# Patient Record
Sex: Female | Born: 1980 | Race: White | Hispanic: No | Smoking: Former smoker
Health system: Southern US, Community
[De-identification: ages and names within clinical notes are randomized; demographics above are authoritative.]

## PROBLEM LIST (undated history)

## (undated) DIAGNOSIS — F419 Anxiety disorder, unspecified: Secondary | ICD-10-CM

## (undated) DIAGNOSIS — R7303 Prediabetes: Secondary | ICD-10-CM

## (undated) DIAGNOSIS — N200 Calculus of kidney: Secondary | ICD-10-CM

## (undated) DIAGNOSIS — F329 Major depressive disorder, single episode, unspecified: Secondary | ICD-10-CM

## (undated) DIAGNOSIS — F32A Depression, unspecified: Secondary | ICD-10-CM

## (undated) DIAGNOSIS — L709 Acne, unspecified: Secondary | ICD-10-CM

## (undated) HISTORY — DX: Calculus of kidney: N20.0

## (undated) HISTORY — DX: Acne, unspecified: L70.9

---

## 2005-04-29 ENCOUNTER — Ambulatory Visit: Payer: Self-pay | Admitting: Family Medicine

## 2005-05-02 ENCOUNTER — Ambulatory Visit (HOSPITAL_COMMUNITY): Admission: RE | Admit: 2005-05-02 | Discharge: 2005-05-02 | Payer: Self-pay | Admitting: Family Medicine

## 2005-05-10 ENCOUNTER — Ambulatory Visit: Payer: Self-pay | Admitting: Family Medicine

## 2006-09-02 ENCOUNTER — Encounter (INDEPENDENT_AMBULATORY_CARE_PROVIDER_SITE_OTHER): Payer: Self-pay | Admitting: Specialist

## 2006-09-02 ENCOUNTER — Ambulatory Visit: Payer: Self-pay | Admitting: Family Medicine

## 2007-11-05 ENCOUNTER — Encounter: Payer: Self-pay | Admitting: Family Medicine

## 2007-11-26 ENCOUNTER — Emergency Department: Payer: Self-pay | Admitting: Emergency Medicine

## 2010-12-04 NOTE — Letter (Signed)
Summary: rpc chart  rpc chart   Imported By: Curtis Sites 08/13/2010 11:36:17  _____________________________________________________________________  External Attachment:    Type:   Image     Comment:   External Document

## 2011-03-19 NOTE — Assessment & Plan Note (Signed)
Molly Miller NO.:  [id number]   MEDICAL RECORD NO.:  [id number]          PATIENT TYPE:  POB   LOCATION:  [facility] at [facility]         FACILITY:  [facility]   PHYSICIAN:  Tinnie Gens, MD        DATE OF BIRTH:  April 05, 1981   DATE OF SERVICE:                                    CLINIC NOTE   CHIEF COMPLAINT:  Yearly exam.   HISTORY OF PRESENT ILLNESS:  The patient is a 31 year old nulligravida who  is currently a smoker.  She is on birth control pills and has been for the  past 2 years.  The patient has normal cycles and is happy with her choice of  birth control currently.   PAST MEDICAL HISTORY:  Negative.   PAST SURGICAL HISTORY:  Negative.   MEDICATIONS:  OrthoEver patch.   ALLERGIES:  None known.   FAMILY HISTORY:  She has a strong family history of ovarian and breast  cancer as well as diabetes and coronary artery disease.  She has a mother  who has had a stroke.   SOCIAL HISTORY:  She works as a Facilities manager.  She is a social alcohol user  and she smokes 1.5 packs a day.   REVIEW OF SYMPTOMS:  Review of systems, 14 point review is negative for  headache, vision changes, hearing loss, difficulty swallowing, shortness of  breath, chest pain, nausea, vomiting, diarrhea, constipation, abdominal  pain, back pain, vaginal discharge, vaginal odor, painful intercourse,  pelvic pain, lower extremity swelling or skin problems.   PHYSICAL EXAMINATION:  Vitals are in the chart.  She is well developed, well  nourished, in no acute distress.  HEENT:  Normocephalic, atraumatic, sclera anicteric.  NECK:  Neck supple, normal thyroid.  LUNGS:  Lungs are clear bilaterally.  CVA:  Regular rate and rhythm, no murmurs, gallops or rubs.  ABDOMEN:  Abdomen is soft, nontender, nondistended.  BREASTS:  Breasts are symmetric with everted nipples.  No mass, no  supraclavicular or axillary adenopathy.  GENITOURINARY:  Normal external female genitalia. The vagina is pink.   The  rugae of the cervix was visualized without difficulty.  Solid breasts in appearance.  Uterus is small anteverted.  No mass or  tenderness.  EXTREMITIES:  No cyanosis, clubbing or edema.   IMPRESSION:  1. Yearly exam with PAP.  2. Tobacco use.   PLAN:  1. PAP smear today.  2. Refill OrthoEver.  3. Discussed at length Chantix and smoking cessation.  The patient will go      on [email or web address] and contact her insurance company bout their coverage of      this medications.  4. At this time, the patient also desires HPV vaccination.  We will order      for her out for this.           ______________________________  Tinnie Gens, MD     TP/MEDQ  D:  09/02/2006  T:  09/03/2006  Job:  [id number]

## 2015-03-30 ENCOUNTER — Ambulatory Visit (INDEPENDENT_AMBULATORY_CARE_PROVIDER_SITE_OTHER): Payer: 59 | Admitting: Primary Care

## 2015-03-30 ENCOUNTER — Encounter: Payer: Self-pay | Admitting: Primary Care

## 2015-03-30 ENCOUNTER — Encounter (INDEPENDENT_AMBULATORY_CARE_PROVIDER_SITE_OTHER): Payer: Self-pay

## 2015-03-30 VITALS — BP 118/78 | HR 78 | Temp 97.9°F | Ht 69.0 in | Wt 230.8 lb

## 2015-03-30 DIAGNOSIS — E66813 Obesity, class 3: Secondary | ICD-10-CM

## 2015-03-30 DIAGNOSIS — F329 Major depressive disorder, single episode, unspecified: Secondary | ICD-10-CM

## 2015-03-30 DIAGNOSIS — E669 Obesity, unspecified: Secondary | ICD-10-CM

## 2015-03-30 DIAGNOSIS — F419 Anxiety disorder, unspecified: Principal | ICD-10-CM

## 2015-03-30 DIAGNOSIS — F32A Depression, unspecified: Secondary | ICD-10-CM | POA: Insufficient documentation

## 2015-03-30 DIAGNOSIS — F418 Other specified anxiety disorders: Secondary | ICD-10-CM | POA: Diagnosis not present

## 2015-03-30 HISTORY — DX: Obesity, class 3: E66.813

## 2015-03-30 HISTORY — DX: Morbid (severe) obesity due to excess calories: E66.01

## 2015-03-30 MED ORDER — ESCITALOPRAM OXALATE 10 MG PO TABS: ORAL_TABLET | ORAL | Status: DC

## 2015-03-30 NOTE — Assessment & Plan Note (Signed)
Once weighed 300 pounds and has done well to lose weight until one year ago after her mother passed. No motivation to eat well or exercise. Treat depression and anxiety symptoms today. Will continue to evaluate and focus more on obesity next visit.

## 2015-03-30 NOTE — Assessment & Plan Note (Signed)
No prior formal diagnosis. PHQ9 score of 20 today. Start Lexapro 20 mg tablets. Take 1/2 tablet by mouth for 6 days, then advance to 1 full tablet daily on day 7. I've explained to her that drugs of the SSRI class can have side effects such as weight gain, sexual dysfunction, insomnia, headache, nausea. These medications are generally effective at alleviating symptoms of anxiety and/or depression. Also discussed the possibility of SI as a side effect and to go to the emergency department if she experienced these thoughts. Follow up in 6 weeks for re-evaluation.

## 2015-03-30 NOTE — Progress Notes (Signed)
   Subjective:    Patient ID: Molly Miller, female    DOB: 08/29/1981, 34 y.o.   MRN: [id number]  HPI  Molly Miller is a 34 year old female who presents today to establish care and discuss the problems mentioned below. Will obtain old records.  1) Acne: Taking spironolcatone 50 mg TID for acne and is following at [facility].  2) Anxiety and Depression: Present intermittently for nearly one year since her mother passed away June 2015. She has recently lost her nephew. She's been feeling down, a lack of motivation to do things, and increased anxiety since Mother's Day 2016. PHQ-9 score of 20 today. Denies SI/HI. She has never been diagnosed in the past. She also reports anxiety, difficulty staying sleep, worry, and difficulty concentrating.   3) Obesity: Once weighed 300+ pounds. She was doing well to lose weight until about one year ago after her mother passed. Diet currently consists of vegetables, lean meat baked, pizza, protein bars, salads, left overs. Drinks mostly diet mountain dew and has lately increased water consumption. She does not currently exercise due to lack of motivation.  Review of Systems  Constitutional: Positive for activity change and fatigue. Negative for fever and chills.  Respiratory: Negative for shortness of breath.   Cardiovascular: Negative for chest pain.  Psychiatric/Behavioral: Positive for sleep disturbance. Negative for suicidal ideas. The patient is nervous/anxious.        Past Medical History  Diagnosis Date  . Acne     History   Social History  . Marital Status: Divorced    Spouse Name: N/A  . Number of Children: N/A  . Years of Education: N/A   Occupational History  . Not on file.   Social History Main Topics  . Smoking status: Never Smoker   . Smokeless tobacco: Not on file  . Alcohol Use: 0.0 oz/week    0 Standard drinks or equivalent per week     Comment: social  . Drug Use: Not on file  . Sexual Activity: Not on file    Other Topics Concern  . Not on file   Social History Narrative   Single   Works at [employer]   Enjoys shopping and antique and [employer] stores, gardening.          History reviewed. No pertinent past surgical history.  Family History  Problem Relation Age of Onset  . Hypertension Father   . Arthritis Father   . Heart disease Father   . Stroke Mother   . Seizures Mother   . Dementia Mother   . Cancer Mother     Cervical?    No Known Allergies  No current outpatient prescriptions on file prior to visit.   No current facility-administered medications on file prior to visit.    BP 118/78 mmHg  Pulse 78  Temp(Src) 97.9 F (36.6 C) (Oral)  Ht 5\' 9"  (1.753 m)  Wt 230 lb 12.8 oz (104.69 kg)  BMI 34.07 kg/m2  SpO2 98%  LMP 02/28/2015    Objective:   Physical Exam  Constitutional: She is oriented to person, place, and time. She appears well-nourished.  Cardiovascular: Normal rate and regular rhythm.   Pulmonary/Chest: Effort normal and breath sounds normal.  Neurological: She is alert and oriented to person, place, and time.  Skin: Skin is warm and dry.  Psychiatric:  Tearful during exam, cooperative and appropriate.          Assessment & Plan:

## 2015-03-30 NOTE — Patient Instructions (Signed)
Start Lexapro tablets for anxiety and depression. Take 1/2 tablet by mouth daily for 6 days, then take 1 tablet by mouth daily. Common side effects include, nausea, stomach upset, headaches. Start taking a calcium with vitamin D supplement daily (1200 mg of calcium and 800 units of vitamin D). It was nice to see you again! Welcome to [facility]!  Follow up in 6 weeks for re-evaluation.

## 2015-03-30 NOTE — Progress Notes (Signed)
Pre visit review using our clinic review tool, if applicable. No additional management support is needed unless otherwise documented below in the visit note. 

## 2015-04-18 ENCOUNTER — Other Ambulatory Visit: Payer: Self-pay | Admitting: Primary Care

## 2015-04-18 ENCOUNTER — Encounter: Payer: Self-pay | Admitting: Primary Care

## 2015-04-18 DIAGNOSIS — F419 Anxiety disorder, unspecified: Principal | ICD-10-CM

## 2015-04-18 DIAGNOSIS — F32A Depression, unspecified: Secondary | ICD-10-CM

## 2015-04-18 DIAGNOSIS — F329 Major depressive disorder, single episode, unspecified: Secondary | ICD-10-CM

## 2015-04-18 MED ORDER — ESCITALOPRAM OXALATE 20 MG PO TABS: 20.0000 mg | ORAL_TABLET | Freq: Every day | ORAL | Status: DC

## 2015-04-27 ENCOUNTER — Encounter: Payer: Self-pay | Admitting: Primary Care

## 2015-05-15 ENCOUNTER — Ambulatory Visit (INDEPENDENT_AMBULATORY_CARE_PROVIDER_SITE_OTHER): Payer: 59 | Admitting: Primary Care

## 2015-05-15 ENCOUNTER — Encounter: Payer: Self-pay | Admitting: Primary Care

## 2015-05-15 VITALS — BP 118/80 | HR 80 | Temp 97.7°F | Ht 69.0 in | Wt 232.0 lb

## 2015-05-15 DIAGNOSIS — F329 Major depressive disorder, single episode, unspecified: Secondary | ICD-10-CM

## 2015-05-15 DIAGNOSIS — F32A Depression, unspecified: Secondary | ICD-10-CM

## 2015-05-15 DIAGNOSIS — F418 Other specified anxiety disorders: Secondary | ICD-10-CM | POA: Diagnosis not present

## 2015-05-15 DIAGNOSIS — F419 Anxiety disorder, unspecified: Principal | ICD-10-CM

## 2015-05-15 MED ORDER — VENLAFAXINE HCL ER 37.5 MG PO CP24: 37.5000 mg | ORAL_CAPSULE | Freq: Every day | ORAL | Status: DC

## 2015-05-15 NOTE — Assessment & Plan Note (Signed)
Overall no improvement. Feeling "zapped" and "not herself" on lexapro. Will d/c lexapro since it's been about 6 weeks since initiation. Start Effexor XR 37.5 daily. Follow up in 4 weeks for re-evaluation.

## 2015-05-15 NOTE — Progress Notes (Signed)
Pre visit review using our clinic review tool, if applicable. No additional management support is needed unless otherwise documented below in the visit note. 

## 2015-05-15 NOTE — Progress Notes (Signed)
   Subjective:    Patient ID: Molly Miller, female    DOB: 12/27/1980, 34 y.o.   MRN: [id number]  HPI  Ms. Molly Miller is a 34 year old female who presents today for follow up. She was evaluated on 03/30/15 as a new patient for a chief complaint of anxiety and depression. She was found to have a PHQ-9 score of 20. She was initiated on Lexapro 10 mg tablets and advanced to 20 mg several weeks later. She also reported occasional panic attacks and was provided with PRN Xanax to bridge over until Lexapro could reach steady state.  Overall she's feeling "zapped" and "dull" since the increased dose of Lexapro. She continued to feel anxious at the 10 mg dose. She's started taking the taking the Lexapro at various times during the day but continues to lack the bubbly personality that she once had.  She would like to switch from the Lexapro and try a different medication. Denies SI/HI.  Review of Systems  Respiratory: Negative for shortness of breath.   Cardiovascular: Negative for chest pain.  Neurological: Negative for headaches.  Psychiatric/Behavioral: Negative for suicidal ideas and sleep disturbance. The patient is not nervous/anxious.        Past Medical History  Diagnosis Date  . Acne     History   Social History  . Marital Status: Divorced    Spouse Name: N/A  . Number of Children: N/A  . Years of Education: N/A   Occupational History  . Not on file.   Social History Main Topics  . Smoking status: Never Smoker   . Smokeless tobacco: Not on file  . Alcohol Use: 0.0 oz/week    0 Standard drinks or equivalent per week     Comment: social  . Drug Use: Not on file  . Sexual Activity: Not on file   Other Topics Concern  . Not on file   Social History Narrative   Single   Works at [employer]   Enjoys shopping and antique and [employer] stores, gardening.          No past surgical history on file.  Family History  Problem Relation Age of Onset  . Hypertension Father   .  Arthritis Father   . Heart disease Father   . Stroke Mother   . Seizures Mother   . Dementia Mother   . Cancer Mother     Cervical?    No Known Allergies  Current Outpatient Prescriptions on File Prior to Visit  Medication Sig Dispense Refill  . spironolactone (ALDACTONE) 50 MG tablet Take 50 mg by mouth daily.     No current facility-administered medications on file prior to visit.    BP 118/80 mmHg  Pulse 80  Temp(Src) 97.7 F (36.5 C) (Oral)  Ht 5\' 9"  (1.753 m)  Wt 232 lb (105.235 kg)  BMI 34.24 kg/m2  SpO2 98%  LMP 05/05/2015    Objective:   Physical Exam  Cardiovascular: Normal rate and regular rhythm.   Pulmonary/Chest: Effort normal and breath sounds normal.  Skin: Skin is warm and dry.  Psychiatric: She has a normal mood and affect.          Assessment & Plan:

## 2015-05-15 NOTE — Patient Instructions (Signed)
Stop taking Lexapro tablets. Start Effexor XR tablets. Take 1 tablet by mouth every morning.  Follow up in 4 weeks for re-evaluation.  It was nice seeing you!

## 2015-05-24 ENCOUNTER — Encounter: Payer: Self-pay | Admitting: Primary Care

## 2015-06-12 ENCOUNTER — Ambulatory Visit: Payer: 59 | Admitting: Primary Care

## 2015-06-12 ENCOUNTER — Encounter: Payer: Self-pay | Admitting: Primary Care

## 2015-06-19 ENCOUNTER — Ambulatory Visit (INDEPENDENT_AMBULATORY_CARE_PROVIDER_SITE_OTHER): Payer: 59 | Admitting: Primary Care

## 2015-06-19 ENCOUNTER — Encounter: Payer: Self-pay | Admitting: Primary Care

## 2015-06-19 VITALS — BP 116/78 | HR 72 | Temp 97.5°F | Wt 232.8 lb

## 2015-06-19 DIAGNOSIS — F329 Major depressive disorder, single episode, unspecified: Secondary | ICD-10-CM

## 2015-06-19 DIAGNOSIS — F418 Other specified anxiety disorders: Secondary | ICD-10-CM

## 2015-06-19 DIAGNOSIS — F419 Anxiety disorder, unspecified: Principal | ICD-10-CM

## 2015-06-19 DIAGNOSIS — F32A Depression, unspecified: Secondary | ICD-10-CM

## 2015-06-19 MED ORDER — VENLAFAXINE HCL ER 75 MG PO CP24: 75.0000 mg | ORAL_CAPSULE | Freq: Every day | ORAL | Status: DC

## 2015-06-19 NOTE — Progress Notes (Signed)
   Subjective:    Patient ID: Molly Miller, female    DOB: 1981-10-02, 34 y.o.   MRN: [id number]  HPI  Molly Miller is a 33 year old female who presents today for follow up of anxiety and depression. She was re-evaluated on 05/15/15 and was feeling "zapped and dull" since the initiation of Lexapro with increase in dose to 20. She didn't feel at goal with her dose at 10, but felt that 20 mg was too much. The Lexapro was discontinued and we initiated Effexor 37.5 mg.   Since her last visit in July she doesn't feel "zapped" and is staring to feel more like herself. She is finding interest in doing more activities she once did. She will continue to have panic attacks when things in her world shift and she encounters new experiences. She has started to journal and enjoys doing that. She has used the Xanax 0.25mg  at bedtime as needed, but is using sparingly. Denies SI/HI. Overall she's had an improvement but doesn't feel completely at goal.  Review of Systems  Respiratory: Negative for shortness of breath.   Cardiovascular: Negative for chest pain.  Psychiatric/Behavioral: Negative for suicidal ideas and sleep disturbance. The patient is nervous/anxious.        Past Medical History  Diagnosis Date  . Acne     Social History   Social History  . Marital Status: Divorced    Spouse Name: N/A  . Number of Children: N/A  . Years of Education: N/A   Occupational History  . Not on file.   Social History Main Topics  . Smoking status: Never Smoker   . Smokeless tobacco: Not on file  . Alcohol Use: 0.0 oz/week    0 Standard drinks or equivalent per week     Comment: social  . Drug Use: Not on file  . Sexual Activity: Not on file   Other Topics Concern  . 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          No past surgical history on file.  Family History  Problem Relation Age of Onset  .  Hypertension Father   . Arthritis Father   . Heart disease Father   . Stroke Mother   . Seizures Mother   . Dementia Mother   . Cancer Mother     Cervical?    No Known Allergies  Current Outpatient Prescriptions on File Prior to Visit  Medication Sig Dispense Refill  . ALPRAZolam (XANAX) 0.5 MG tablet Take 0.5 mg by mouth at bedtime as needed for anxiety.    Marland Kitchen spironolactone (ALDACTONE) 50 MG tablet Take 50 mg by mouth daily.     No current facility-administered medications on file prior to visit.    BP 116/78 mmHg  Pulse 72  Temp(Src) 97.5 F (36.4 C) (Oral)  Wt 232 lb 12.8 oz (105.597 kg)  SpO2 98%  LMP 06/19/2015    Objective:   Physical Exam  Constitutional: She appears well-nourished.  Cardiovascular: Normal rate and regular rhythm.   Pulmonary/Chest: Effort normal and breath sounds normal.  Skin: Skin is warm and dry.  Psychiatric: She has a normal mood and affect.          Assessment & Plan:

## 2015-06-19 NOTE — Patient Instructions (Signed)
Start Effexor XR 75 mg tablets. Take 1 tablet by mouth every morning. Give this 2-3 weeks, and if it's took intense, then go back down to your 37.5 mg.  Follow up in 3 months for re-evaluation.  It was a pleasure to see you today!

## 2015-06-19 NOTE — Assessment & Plan Note (Signed)
Improved on Effexor XR 37.5, not at goal. Will continue to expereince some panic attacks. Increase dose to XR 75 mg daily. She is to update me in her condition on My Chart in 2-3 weeks. Follow up in 3 months or sooner if needed.

## 2015-06-19 NOTE — Progress Notes (Signed)
Pre visit review using our clinic review tool, if applicable. No additional management support is needed unless otherwise documented below in the visit note. 

## 2015-07-05 ENCOUNTER — Encounter: Payer: Self-pay | Admitting: Primary Care

## 2015-08-03 ENCOUNTER — Encounter: Payer: Self-pay | Admitting: Primary Care

## 2015-08-29 ENCOUNTER — Other Ambulatory Visit: Payer: Self-pay | Admitting: Primary Care

## 2015-08-29 DIAGNOSIS — F329 Major depressive disorder, single episode, unspecified: Secondary | ICD-10-CM

## 2015-08-29 DIAGNOSIS — F419 Anxiety disorder, unspecified: Principal | ICD-10-CM

## 2015-08-29 DIAGNOSIS — F32A Depression, unspecified: Secondary | ICD-10-CM

## 2015-08-29 MED ORDER — VENLAFAXINE HCL ER 75 MG PO CP24: 75.0000 mg | ORAL_CAPSULE | Freq: Every day | ORAL | Status: DC

## 2015-08-29 NOTE — Telephone Encounter (Signed)
Received faxed refill from [facility] for  venlafaxine XR (EFFEXOR XR) 75 MG 24 hr capsule     Take 1 capsule (75 mg total) by mouth daily with breakfast.   Dispense:  30 capsules   Refill:   3  Last prescribed on 06/19/2015. Last seen on 06/19/2015. Next follow up on 09/26/2015.

## 2015-09-26 ENCOUNTER — Ambulatory Visit: Payer: 59 | Admitting: Primary Care

## 2015-10-03 ENCOUNTER — Ambulatory Visit: Payer: 59 | Admitting: Primary Care

## 2015-10-11 ENCOUNTER — Ambulatory Visit (INDEPENDENT_AMBULATORY_CARE_PROVIDER_SITE_OTHER): Payer: 59 | Admitting: Primary Care

## 2015-10-11 ENCOUNTER — Encounter: Payer: Self-pay | Admitting: Primary Care

## 2015-10-11 VITALS — BP 122/76 | HR 73 | Temp 97.6°F | Ht 69.0 in | Wt 243.4 lb

## 2015-10-11 DIAGNOSIS — F32A Depression, unspecified: Secondary | ICD-10-CM

## 2015-10-11 DIAGNOSIS — F419 Anxiety disorder, unspecified: Secondary | ICD-10-CM

## 2015-10-11 DIAGNOSIS — N390 Urinary tract infection, site not specified: Secondary | ICD-10-CM | POA: Diagnosis not present

## 2015-10-11 DIAGNOSIS — F418 Other specified anxiety disorders: Secondary | ICD-10-CM

## 2015-10-11 DIAGNOSIS — E669 Obesity, unspecified: Secondary | ICD-10-CM

## 2015-10-11 DIAGNOSIS — R319 Hematuria, unspecified: Secondary | ICD-10-CM

## 2015-10-11 DIAGNOSIS — F329 Major depressive disorder, single episode, unspecified: Secondary | ICD-10-CM

## 2015-10-11 LAB — POCT URINALYSIS DIPSTICK
BILIRUBIN UA: NEGATIVE
GLUCOSE UA: NEGATIVE
Ketones, UA: NEGATIVE
NITRITE UA: POSITIVE
SPEC GRAV UA: 1.025
UROBILINOGEN UA: NEGATIVE
pH, UA: 6

## 2015-10-11 MED ORDER — CIPROFLOXACIN HCL 250 MG PO TABS: 250.0000 mg | ORAL_TABLET | Freq: Two times a day (BID) | ORAL | Status: DC

## 2015-10-11 MED ORDER — CIPROFLOXACIN HCL 250 MG PO TABS
250.0000 mg | ORAL_TABLET | Freq: Two times a day (BID) | ORAL | Status: DC
Start: 2015-10-11 — End: 2015-10-13

## 2015-10-11 NOTE — Assessment & Plan Note (Signed)
Improved on Effexor XR 75 mg. Rare instances of panic, overall feeling good. Denies SI/HI. Continue current dose, will continue to monitor.

## 2015-10-11 NOTE — Assessment & Plan Note (Signed)
Discussed importance of diet and exercise. She will start exercising and is motivated to lose weight.

## 2015-10-11 NOTE — Progress Notes (Signed)
Pre visit review using our clinic review tool, if applicable. No additional management support is needed unless otherwise documented below in the visit note. 

## 2015-10-11 NOTE — Patient Instructions (Addendum)
Continue Effexor XR 75 mg and alprazolam as needed.  It is important that you improve your diet. Please limit carbohydrates in the form of white bread, rice, pasta, sweets, fried foods, etc. Increase your consumption of fresh fruits and vegetables.  You need to consume about 2 liters of water daily.  Start exercising. You should be getting 1 hour of moderate intensity exercise 5 days weekly.  Start Ciprofloxacin antibiotics. Take 1 tablet by mouth twice daily for 7 days.  Increase fluids to stay hydrated.  It was a pleasure to see you today!

## 2015-10-11 NOTE — Progress Notes (Signed)
Subjective:    Patient ID: Molly Miller, female    DOB: 05/21/1981, 34 y.o.   MRN: [id number]  HPI  Ms. Molly Miller is a 34 year old female who presents today for follow up of anxiety and depression and for multiple complaints. She is currently managed on Effexor XR 75 mg that was increased during her last visit in August 2016 as she felt improved, but not yet at goal on the 37.5 mg dose.  Since her last visit she's felt improved overall. She's had a few instances of panice and inability to be around large crowds or increased amount of stress. She had to leave [employer] last weekend due to the crowds. She uses her Xanax sparingly and hasn't needed it recently. Denies SI/HI.  2) Obesity: Frustrated with lack of weight loss. She's tried to make her portion sizes smaller. She is not currently exercing.   Her diet currently consists of: Breakfast: Skips Snack: Protein bars, cheese Lunch: Left overs from dinner Dinner: Stir fry chicken, salads, pasta, tacos. Snacks: None  Exercise: She is currently not exercising   3) Flank Pain: Located to the right flank that she noticed 2-3 days ago. She describes her pain as a dull ache. Increased urinary frequency that began yesterday. Denies dysuria, hematuria, fevers, chills, vaginal symptoms. She's limited her soda consumption over the last several months.  Wt Readings from Last 3 Encounters:  10/11/15 243 lb 6.4 oz (110.406 kg)  06/19/15 232 lb 12.8 oz (105.597 kg)  05/15/15 232 lb (105.235 kg)     Review of Systems  Constitutional: Negative for fever and chills.  Genitourinary: Positive for urgency, frequency and flank pain. Negative for dysuria, hematuria, vaginal discharge and difficulty urinating.  Psychiatric/Behavioral: Negative for suicidal ideas. The patient is not nervous/anxious.        Past Medical History  Diagnosis Date  . Acne     Social History   Social History  . Marital Status: Divorced    Spouse Name: N/A  . Number  of Children: N/A  . Years of Education: N/A   Occupational History  . Not on file.   Social History Main Topics  . Smoking status: Never Smoker   . Smokeless tobacco: Not on file  . Alcohol Use: 0.0 oz/week    0 Standard drinks or equivalent per week     Comment: social  . Drug Use: Not on file  . Sexual Activity: Not on file   Other Topics Concern  . Not on file   Social History Narrative   Single   Works at [employer]   Enjoys shopping and antique and [employer] stores, gardening.          No past surgical history on file.  Family History  Problem Relation Age of Onset  . Hypertension Father   . Arthritis Father   . Heart disease Father   . Stroke Mother   . Seizures Mother   . Dementia Mother   . Cancer Mother     Cervical?    No Known Allergies  Current Outpatient Prescriptions on File Prior to Visit  Medication Sig Dispense Refill  . ALPRAZolam (XANAX) 0.5 MG tablet Take 0.5 mg by mouth at bedtime as needed for anxiety.    Marland Kitchen. spironolactone (ALDACTONE) 50 MG tablet Take 50 mg by mouth daily.    Marland Kitchen. venlafaxine XR (EFFEXOR XR) 75 MG 24 hr capsule Take 1 capsule (75 mg total) by mouth daily with breakfast. 90 capsule 0  No current facility-administered medications on file prior to visit.    BP 122/76 mmHg  Pulse 73  Temp(Src) 97.6 F (36.4 C) (Oral)  Ht  (1.753 m)  Wt 243 lb 6.4 oz (110.406 kg)  BMI 35.93 kg/m2  SpO2 97%  LMP 10/01/2015    Objective:   Physical Exam  Constitutional: She appears well-nourished.  Cardiovascular: Normal rate and regular rhythm.   Pulmonary/Chest: Effort normal and breath sounds normal.  Abdominal: There is no CVA tenderness.  Skin: Skin is warm and dry.  Psychiatric: She has a normal mood and affect.          Assessment & Plan:  Urinary tract infection:  Right sided flank pain x 3 days, with urinary frequency x 1 day.  Dull achey pain, now feeling worse. UA: Positive for leuks, nitrites,  blood. Culture sent. Treat with Cipro course, fluids, rest.

## 2015-10-13 ENCOUNTER — Other Ambulatory Visit: Payer: Self-pay | Admitting: Primary Care

## 2015-10-13 DIAGNOSIS — R319 Hematuria, unspecified: Principal | ICD-10-CM

## 2015-10-13 DIAGNOSIS — N390 Urinary tract infection, site not specified: Secondary | ICD-10-CM

## 2015-10-13 LAB — URINE CULTURE

## 2015-10-13 MED ORDER — SULFAMETHOXAZOLE-TRIMETHOPRIM 800-160 MG PO TABS: 1.0000 | ORAL_TABLET | Freq: Two times a day (BID) | ORAL | Status: DC

## 2015-10-19 ENCOUNTER — Other Ambulatory Visit: Payer: Self-pay | Admitting: Primary Care

## 2015-10-19 NOTE — Telephone Encounter (Signed)
Electronically refill request for   venlafaxine XR (EFFEXOR XR) 75 MG 24 hr capsule   Take 1 capsule (75 mg total) by mouth daily with breakfast.  Dispense: 90 capsule   Refills: 0     Last prescribed on 08/29/2015. Last seen on 10/11/2015. No future appointment.

## 2015-10-20 ENCOUNTER — Other Ambulatory Visit: Payer: Self-pay | Admitting: Primary Care

## 2015-10-20 ENCOUNTER — Encounter: Payer: Self-pay | Admitting: Primary Care

## 2015-10-20 DIAGNOSIS — T3695XA Adverse effect of unspecified systemic antibiotic, initial encounter: Principal | ICD-10-CM

## 2015-10-20 DIAGNOSIS — B379 Candidiasis, unspecified: Secondary | ICD-10-CM

## 2015-10-20 MED ORDER — FLUCONAZOLE 150 MG PO TABS: 150.0000 mg | ORAL_TABLET | Freq: Once | ORAL | Status: DC

## 2015-11-08 ENCOUNTER — Encounter: Payer: Self-pay | Admitting: Primary Care

## 2015-11-23 ENCOUNTER — Encounter: Payer: Self-pay | Admitting: Primary Care

## 2015-11-24 ENCOUNTER — Encounter: Payer: Self-pay | Admitting: Primary Care

## 2015-11-27 ENCOUNTER — Other Ambulatory Visit: Payer: Self-pay | Admitting: Primary Care

## 2015-11-27 DIAGNOSIS — F411 Generalized anxiety disorder: Secondary | ICD-10-CM

## 2015-12-27 ENCOUNTER — Encounter: Payer: Self-pay | Admitting: Primary Care

## 2015-12-27 ENCOUNTER — Ambulatory Visit: Payer: 59 | Admitting: Psychology

## 2015-12-28 ENCOUNTER — Ambulatory Visit (INDEPENDENT_AMBULATORY_CARE_PROVIDER_SITE_OTHER): Payer: 59 | Admitting: Primary Care

## 2015-12-28 ENCOUNTER — Encounter: Payer: Self-pay | Admitting: Primary Care

## 2015-12-28 VITALS — BP 122/76 | HR 105 | Temp 98.4°F | Ht 69.0 in | Wt 241.1 lb

## 2015-12-28 DIAGNOSIS — R6889 Other general symptoms and signs: Secondary | ICD-10-CM | POA: Diagnosis not present

## 2015-12-28 LAB — POCT INFLUENZA A/B
INFLUENZA B, POC: NEGATIVE
Influenza A, POC: NEGATIVE

## 2015-12-28 NOTE — Progress Notes (Signed)
Subjective:    Patient ID: Molly Miller, female    DOB: [birth year], 35 y.o.   MRN: [id number]  HPI  Molly Miller is a 35 year old female who presents today with a chief complaint of flu-like symptoms. Her symptoms include nasal congestion, chills, body aches, fatigue, headaches. Her symptoms began suddenly on 12/26/15 with moderate/severe chills, diaphoresis, and shaking. Denies fevers, cough, vomiting, sick contacts. She's taken alka-selzer cold plus and ibuprofen with some improvement.  Review of Systems  Constitutional: Positive for chills, diaphoresis and fatigue. Negative for fever.  HENT: Positive for congestion.   Respiratory: Negative for cough and shortness of breath.   Cardiovascular: Negative for chest pain.  Gastrointestinal: Negative for nausea.  Musculoskeletal: Positive for myalgias.       Past Medical History  Diagnosis Date  . Acne     Social History   Social History  . Marital Status: Divorced    Spouse Name: N/A  . Number of Children: N/A  . Years of Education: N/A   Occupational History  . Not on file.   Social History Main Topics  . Smoking status: Never Smoker   . Smokeless tobacco: Not on file  . Alcohol Use: 0.0 oz/week    0 Standard drinks or equivalent per week     Comment: social  . Drug Use: Not on file  . Sexual Activity: Not on file   Other Topics Concern  . 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          No past surgical history on file.  Family History  Problem Relation Age of Onset  . Hypertension Father   . Arthritis Father   . Heart disease Father   . Stroke Mother   . Seizures Mother   . Dementia Mother   . Cancer Mother     Cervical?    No Known Allergies  Current Outpatient Prescriptions on File Prior to Visit  Medication Sig Dispense Refill  . ALPRAZolam (XANAX) 0.5 MG tablet Take 0.5 mg by mouth at bedtime as  needed for anxiety.    . fluconazole (DIFLUCAN) 150 MG tablet Take 1 tablet (150 mg total) by mouth once. 1 tablet 0  . spironolactone (ALDACTONE) 50 MG tablet Take 50 mg by mouth daily.    Marland Kitchen sulfamethoxazole-trimethoprim (BACTRIM DS,SEPTRA DS) 800-160 MG tablet Take 1 tablet by mouth 2 (two) times daily. 10 tablet 0  . venlafaxine XR (EFFEXOR-XR) 75 MG 24 hr capsule Take 1 capsule by mouth  daily with breakfast 90 capsule 0   No current facility-administered medications on file prior to visit.    BP 122/76 mmHg  Pulse 105  Temp(Src) 98.4 F (36.9 C) (Oral)  Ht  (1.753 m)  Wt 241 lb 1.9 oz (109.371 kg)  BMI 35.59 kg/m2  SpO2 98%  LMP 11/30/2015    Objective:   Physical Exam  Constitutional: She appears well-nourished.  HENT:  Right Ear: Tympanic membrane and ear canal normal.  Left Ear: Tympanic membrane and ear canal normal.  Nose: Mucosal edema present. Right sinus exhibits no maxillary sinus tenderness and no frontal sinus tenderness. Left sinus exhibits no maxillary sinus tenderness and no frontal sinus tenderness.  Mouth/Throat: Oropharynx is clear and moist.  Eyes: Conjunctivae are normal.  Neck: Neck supple.  Cardiovascular: Regular rhythm.   Sinus tachycardia at 105  Pulmonary/Chest: Effort normal  and breath sounds normal. She has no wheezes. She has no rales.  Lymphadenopathy:    She has no cervical adenopathy.  Skin: Skin is warm and dry.          Assessment & Plan:  URI:  Sudden onset of sweats, chills, body aches, nasal congestion 2 days ago. Some improvement with OTC treatment. Exam with clear lungs. Does appear ill, but not toxic. Rapid Flu: Negative. Suspect viral process and will treat with supportive measures. Ibuprofen, fluids, rest. Return precautions provided.

## 2015-12-28 NOTE — Patient Instructions (Signed)
Your flu test was negative, but your symptoms represent a virus which will pass on its own.  Continue ibuprofen as needed for body aches and chills. Take 600 mg three times daily as needed.  Please notify me if you develop persistent fevers of 101, start coughing up green mucous, notice increased fatigue or weakness, or feel worse after 1 week of onset of symptoms.   Increase consumption of water intake and rest.  It was a pleasure to see you today!

## 2015-12-28 NOTE — Progress Notes (Signed)
Pre visit review using our clinic review tool, if applicable. No additional management support is needed unless otherwise documented below in the visit note. 

## 2016-01-01 ENCOUNTER — Telehealth: Payer: Self-pay | Admitting: Primary Care

## 2016-01-01 NOTE — Telephone Encounter (Signed)
NotedJohny Miller, will you please handle?

## 2016-01-01 NOTE — Telephone Encounter (Signed)
Done and faxed

## 2016-01-01 NOTE — Telephone Encounter (Signed)
Pt called and needs a Return to Work note faxed to [phone] (secure fax) Attn: Mercy Riding in HR. Please advise

## 2016-01-04 ENCOUNTER — Encounter: Payer: Self-pay | Admitting: Primary Care

## 2016-01-10 ENCOUNTER — Encounter: Payer: Self-pay | Admitting: Primary Care

## 2016-01-14 ENCOUNTER — Emergency Department: Payer: 59

## 2016-01-14 ENCOUNTER — Emergency Department
Admission: EM | Admit: 2016-01-14 | Discharge: 2016-01-14 | Disposition: A | Discharge status: Home / Self Care | Payer: 59 | Attending: Emergency Medicine | Admitting: Emergency Medicine

## 2016-01-14 ENCOUNTER — Encounter
Admission: EM | Disposition: A | Discharge status: Home / Self Care | Payer: Self-pay | Source: Home / Self Care | Attending: Emergency Medicine

## 2016-01-14 ENCOUNTER — Encounter: Payer: Self-pay | Admitting: Emergency Medicine

## 2016-01-14 ENCOUNTER — Emergency Department: Payer: 59 | Admitting: Anesthesiology

## 2016-01-14 DIAGNOSIS — N132 Hydronephrosis with renal and ureteral calculous obstruction: Secondary | ICD-10-CM | POA: Diagnosis not present

## 2016-01-14 DIAGNOSIS — Z820 Family history of epilepsy and other diseases of the nervous system: Secondary | ICD-10-CM | POA: Diagnosis not present

## 2016-01-14 DIAGNOSIS — F419 Anxiety disorder, unspecified: Secondary | ICD-10-CM | POA: Diagnosis not present

## 2016-01-14 DIAGNOSIS — Z1619 Resistance to other specified beta lactam antibiotics: Secondary | ICD-10-CM | POA: Diagnosis not present

## 2016-01-14 DIAGNOSIS — Z79899 Other long term (current) drug therapy: Secondary | ICD-10-CM | POA: Diagnosis not present

## 2016-01-14 DIAGNOSIS — Z6835 Body mass index (BMI) 35.0-35.9, adult: Secondary | ICD-10-CM | POA: Diagnosis not present

## 2016-01-14 DIAGNOSIS — B9620 Unspecified Escherichia coli [E. coli] as the cause of diseases classified elsewhere: Secondary | ICD-10-CM | POA: Insufficient documentation

## 2016-01-14 DIAGNOSIS — F329 Major depressive disorder, single episode, unspecified: Secondary | ICD-10-CM | POA: Diagnosis not present

## 2016-01-14 DIAGNOSIS — N201 Calculus of ureter: Secondary | ICD-10-CM | POA: Diagnosis present

## 2016-01-14 DIAGNOSIS — R1011 Right upper quadrant pain: Secondary | ICD-10-CM

## 2016-01-14 DIAGNOSIS — Z1611 Resistance to penicillins: Secondary | ICD-10-CM | POA: Diagnosis not present

## 2016-01-14 DIAGNOSIS — E669 Obesity, unspecified: Secondary | ICD-10-CM | POA: Insufficient documentation

## 2016-01-14 DIAGNOSIS — R109 Unspecified abdominal pain: Secondary | ICD-10-CM

## 2016-01-14 DIAGNOSIS — R112 Nausea with vomiting, unspecified: Secondary | ICD-10-CM

## 2016-01-14 DIAGNOSIS — N12 Tubulo-interstitial nephritis, not specified as acute or chronic: Secondary | ICD-10-CM

## 2016-01-14 DIAGNOSIS — Z1639 Resistance to other specified antimicrobial drug: Secondary | ICD-10-CM | POA: Diagnosis not present

## 2016-01-14 DIAGNOSIS — Z8261 Family history of arthritis: Secondary | ICD-10-CM | POA: Insufficient documentation

## 2016-01-14 DIAGNOSIS — N136 Pyonephrosis: Secondary | ICD-10-CM | POA: Diagnosis not present

## 2016-01-14 DIAGNOSIS — Z8249 Family history of ischemic heart disease and other diseases of the circulatory system: Secondary | ICD-10-CM | POA: Diagnosis not present

## 2016-01-14 DIAGNOSIS — Z809 Family history of malignant neoplasm, unspecified: Secondary | ICD-10-CM | POA: Diagnosis not present

## 2016-01-14 DIAGNOSIS — K760 Fatty (change of) liver, not elsewhere classified: Secondary | ICD-10-CM | POA: Insufficient documentation

## 2016-01-14 DIAGNOSIS — Z823 Family history of stroke: Secondary | ICD-10-CM | POA: Insufficient documentation

## 2016-01-14 HISTORY — PX: CYSTOSCOPY WITH STENT PLACEMENT: SHX5790

## 2016-01-14 LAB — CBC
HCT: 40.3 % (ref 35.0–47.0)
Hemoglobin: 13.4 g/dL (ref 12.0–16.0)
MCH: 28.4 pg (ref 26.0–34.0)
MCHC: 33.3 g/dL (ref 32.0–36.0)
MCV: 85.3 fL (ref 80.0–100.0)
PLATELETS: 324 10*3/uL (ref 150–440)
RBC: 4.72 MIL/uL (ref 3.80–5.20)
RDW: 13 % (ref 11.5–14.5)
WBC: 38.2 10*3/uL — ABNORMAL HIGH (ref 3.6–11.0)

## 2016-01-14 LAB — URINALYSIS COMPLETE WITH MICROSCOPIC (ARMC ONLY)
GLUCOSE, UA: NEGATIVE mg/dL
NITRITE: NEGATIVE
Protein, ur: >500 mg/dL — AB
SPECIFIC GRAVITY, URINE: 1.03 (ref 1.005–1.030)
pH: 5 (ref 5.0–8.0)

## 2016-01-14 LAB — DIFFERENTIAL
BASOS PCT: 0 %
Basophils Absolute: 0.1 10*3/uL (ref 0–0.1)
EOS PCT: 0 %
Eosinophils Absolute: 0 10*3/uL (ref 0–0.7)
Lymphocytes Relative: 2 %
Lymphs Abs: 0.6 10*3/uL — ABNORMAL LOW (ref 1.0–3.6)
MONO ABS: 1.6 10*3/uL — AB (ref 0.2–0.9)
Monocytes Relative: 4 %
Neutro Abs: 36.2 10*3/uL — ABNORMAL HIGH (ref 1.4–6.5)
Neutrophils Relative %: 94 %

## 2016-01-14 LAB — COMPREHENSIVE METABOLIC PANEL
ALBUMIN: 3.5 g/dL (ref 3.5–5.0)
ALK PHOS: 93 U/L (ref 38–126)
ALT: 17 U/L (ref 14–54)
ANION GAP: 11 (ref 5–15)
AST: 22 U/L (ref 15–41)
BUN: 16 mg/dL (ref 6–20)
CHLORIDE: 101 mmol/L (ref 101–111)
CO2: 23 mmol/L (ref 22–32)
Calcium: 8.6 mg/dL — ABNORMAL LOW (ref 8.9–10.3)
Creatinine, Ser: 1.18 mg/dL — ABNORMAL HIGH (ref 0.44–1.00)
GFR calc non Af Amer: 59 mL/min — ABNORMAL LOW (ref >60)
GLUCOSE: 125 mg/dL — AB (ref 65–99)
Potassium: 4 mmol/L (ref 3.5–5.1)
SODIUM: 135 mmol/L (ref 135–145)
Total Bilirubin: 1.7 mg/dL — ABNORMAL HIGH (ref 0.3–1.2)
Total Protein: 7.5 g/dL (ref 6.5–8.1)

## 2016-01-14 LAB — POCT PREGNANCY, URINE: PREG TEST UR: NEGATIVE

## 2016-01-14 LAB — LACTIC ACID, PLASMA: LACTIC ACID, VENOUS: 1 mmol/L (ref 0.5–2.0)

## 2016-01-14 LAB — LIPASE, BLOOD: LIPASE: 16 U/L (ref 11–51)

## 2016-01-14 SURGERY — CYSTOSCOPY, WITH STENT INSERTION
Anesthesia: General | Laterality: Right | Wound class: Clean Contaminated

## 2016-01-14 MED ORDER — ONDANSETRON HCL 4 MG/2ML IJ SOLN
4.0000 mg | Freq: Once | INTRAMUSCULAR | Status: AC | PRN
  Administered 2016-01-14: 4 mg via INTRAVENOUS

## 2016-01-14 MED ORDER — FENTANYL CITRATE (PF) 100 MCG/2ML IJ SOLN
25.0000 ug | INTRAMUSCULAR | Status: DC | PRN
  Administered 2016-01-14 (×4): 25 ug via INTRAVENOUS

## 2016-01-14 MED ORDER — TRAMADOL HCL 50 MG PO TABS
50.0000 mg | ORAL_TABLET | Freq: Once | ORAL | Status: AC
  Administered 2016-01-14: 50 mg via ORAL

## 2016-01-14 MED ORDER — PIPERACILLIN-TAZOBACTAM 3.375 G IVPB: 3.3750 g | Freq: Three times a day (TID) | INTRAVENOUS | Status: DC

## 2016-01-14 MED ORDER — TRAMADOL HCL 50 MG PO TABS
ORAL_TABLET | ORAL | Status: AC
  Administered 2016-01-14: 50 mg via ORAL
  Filled 2016-01-14: qty 1

## 2016-01-14 MED ORDER — TROSPIUM CHLORIDE ER 60 MG PO CP24
60.0000 mg | ORAL_CAPSULE | Freq: Every day | ORAL | Status: DC
Start: 2016-01-14 — End: 2016-01-30

## 2016-01-14 MED ORDER — ONDANSETRON HCL 4 MG/2ML IJ SOLN
INTRAMUSCULAR | Status: DC | PRN
  Administered 2016-01-14: 4 mg via INTRAVENOUS

## 2016-01-14 MED ORDER — PHENAZOPYRIDINE HCL 200 MG PO TABS: 200.0000 mg | ORAL_TABLET | Freq: Three times a day (TID) | ORAL | Status: DC | PRN

## 2016-01-14 MED ORDER — SODIUM CHLORIDE 0.9 % IV BOLUS (SEPSIS)
1000.0000 mL | INTRAVENOUS | Status: AC
  Administered 2016-01-14 (×2): 1000 mL via INTRAVENOUS

## 2016-01-14 MED ORDER — ONDANSETRON HCL 4 MG/2ML IJ SOLN
4.0000 mg | Freq: Once | INTRAMUSCULAR | Status: AC
  Administered 2016-01-14: 4 mg via INTRAVENOUS
  Filled 2016-01-14: qty 2

## 2016-01-14 MED ORDER — PIPERACILLIN-TAZOBACTAM 3.375 G IVPB 30 MIN
3.3750 g | Freq: Once | INTRAVENOUS | Status: AC
  Administered 2016-01-14: 3.375 g via INTRAVENOUS
  Filled 2016-01-14: qty 50

## 2016-01-14 MED ORDER — LABETALOL HCL 5 MG/ML IV SOLN
5.0000 mg | INTRAVENOUS | Status: AC | PRN
  Administered 2016-01-14 (×2): 5 mg via INTRAVENOUS

## 2016-01-14 MED ORDER — SULFAMETHOXAZOLE-TRIMETHOPRIM 800-160 MG PO TABS: 1.0000 | ORAL_TABLET | Freq: Two times a day (BID) | ORAL | Status: DC

## 2016-01-14 MED ORDER — LABETALOL HCL 5 MG/ML IV SOLN
INTRAVENOUS | Status: AC
  Administered 2016-01-14: 5 mg via INTRAVENOUS
  Filled 2016-01-14: qty 4

## 2016-01-14 MED ORDER — SODIUM CHLORIDE 0.9 % IV BOLUS (SEPSIS): 500.0000 mL | INTRAVENOUS | Status: DC

## 2016-01-14 MED ORDER — TRAMADOL HCL 50 MG PO TABS: 50.0000 mg | ORAL_TABLET | Freq: Four times a day (QID) | ORAL | Status: DC | PRN

## 2016-01-14 MED ORDER — CEFAZOLIN SODIUM-DEXTROSE 2-3 GM-% IV SOLR
INTRAVENOUS | Status: DC | PRN
  Administered 2016-01-14: 2 g via INTRAVENOUS

## 2016-01-14 MED ORDER — PROPOFOL 10 MG/ML IV BOLUS
INTRAVENOUS | Status: DC | PRN
  Administered 2016-01-14: 160 mg via INTRAVENOUS

## 2016-01-14 MED ORDER — FENTANYL CITRATE (PF) 100 MCG/2ML IJ SOLN
INTRAMUSCULAR | Status: DC | PRN
  Administered 2016-01-14 (×2): 50 ug via INTRAVENOUS

## 2016-01-14 MED ORDER — MIDAZOLAM HCL 2 MG/2ML IJ SOLN
INTRAMUSCULAR | Status: DC | PRN
  Administered 2016-01-14: 2 mg via INTRAVENOUS

## 2016-01-14 MED ORDER — FENTANYL CITRATE (PF) 100 MCG/2ML IJ SOLN
INTRAMUSCULAR | Status: AC
  Administered 2016-01-14: 25 ug via INTRAVENOUS
  Filled 2016-01-14: qty 2

## 2016-01-14 MED ORDER — IOTHALAMATE MEGLUMINE 43 % IV SOLN
INTRAVENOUS | Status: DC | PRN
  Administered 2016-01-14: 7 mL via URETHRAL

## 2016-01-14 MED ORDER — ONDANSETRON HCL 4 MG/2ML IJ SOLN
INTRAMUSCULAR | Status: AC
  Administered 2016-01-14: 4 mg via INTRAVENOUS
  Filled 2016-01-14: qty 2

## 2016-01-14 MED ORDER — LACTATED RINGERS IV SOLN
INTRAVENOUS | Status: DC | PRN
  Administered 2016-01-14: 21:00:00 via INTRAVENOUS

## 2016-01-14 SURGICAL SUPPLY — 20 items
BAG DRAIN CYSTO-URO LG1000N (MISCELLANEOUS) IMPLANT
CATH URETL 5X70 OPEN END (CATHETERS) ×2 IMPLANT
GLOVE BIO SURGEON STRL SZ7 (GLOVE) ×2 IMPLANT
GLOVE BIO SURGEON STRL SZ7.5 (GLOVE) ×2 IMPLANT
GOWN STRL REUS W/ TWL LRG LVL3 (GOWN DISPOSABLE) ×2 IMPLANT
GOWN STRL REUS W/TWL LRG LVL3 (GOWN DISPOSABLE) ×2
PACK CYSTO AR (MISCELLANEOUS) ×2 IMPLANT
PREP PVP WINGED SPONGE (MISCELLANEOUS) ×2 IMPLANT
SENSORWIRE 0.038 NOT ANGLED (WIRE) ×2
SET CYSTO W/LG BORE CLAMP LF (SET/KITS/TRAYS/PACK) ×2 IMPLANT
SOL .9 NS 3000ML IRR  AL (IV SOLUTION) ×1
SOL .9 NS 3000ML IRR UROMATIC (IV SOLUTION) ×1 IMPLANT
SOL PREP PVP 2OZ (MISCELLANEOUS) ×2
SOLUTION PREP PVP 2OZ (MISCELLANEOUS) ×1 IMPLANT
STENT URET 6FRX24 CONTOUR (STENTS) IMPLANT
STENT URET 6FRX26 CONTOUR (STENTS) ×2 IMPLANT
SURGILUBE 2OZ TUBE FLIPTOP (MISCELLANEOUS) ×2 IMPLANT
SYR 20CC LL (SYRINGE) ×2 IMPLANT
WATER STERILE IRR 1000ML POUR (IV SOLUTION) ×2 IMPLANT
WIRE SENSOR 0.038 NOT ANGLED (WIRE) ×1 IMPLANT

## 2016-01-14 NOTE — Anesthesia Procedure Notes (Signed)
Procedure Name: LMA Insertion Date/Time: 01/14/2016 9:00 PM Performed by: [id number], Ranata Laughery Pre-anesthesia Checklist: Patient identified, Emergency Drugs available, Suction available and Patient being monitored Patient Re-evaluated:Patient Re-evaluated prior to inductionOxygen Delivery Method: Circle system utilized Preoxygenation: Pre-oxygenation with 100% oxygen Intubation Type: IV induction LMA: LMA inserted LMA Size: 4.0 Number of attempts: 1 Tube secured with: Tape

## 2016-01-14 NOTE — ED Notes (Signed)
Pt complains of pain to lower back on right side that started yesterday and radiates around to abdomen. Pt complains of nausea but denies vomiting. Pt states that pain in abdomen is more in the upper quadrants.

## 2016-01-14 NOTE — H&P (Signed)
I have been asked to see the patient by Dr. Gladstone Pihavid Schaevitz, for evaluation and management of right sided nephrolithiasis.  History of present illness: 17F who presented to the ED this AM for severe RUQ and right flank pain. The patient's pain has been present for 3 weeks. However, over the last 24 hours and had gotten to a point where she could no longer manage it at home. Her pain was sharp, located in the right flank predominantly. It did radiate across to the upper abdomen. The patient has not had any true fever, but has had right ureters and chills. She is also associated nausea and vomiting. She denies any dysuria or gross hematuria. She is not having any suprapubic pain.  In the emergency room the patient underwent a CT scan which demonstrates a 13 times 8 times 13 mm stone in the right UPJ with an additional 2 smaller fragments more distal in the ureter as well as 7-8 mm stone in the right lower pole. Further, the patient had a white count of 34,000. Her urinalysis was nitrite negative however, the patient did have significant amount of pyuria. In the ED, her pain was reasonably well controlled. She was hemodynamically stable without evidence of fever.  Review of systems: A 12 point comprehensive review of systems was obtained and is negative unless otherwise stated in the history of present illness.  Patient Active Problem List   Diagnosis Date Noted  . Anxiety and depression 03/30/2015  . Obesity 03/30/2015    No current facility-administered medications on file prior to encounter.   Current Outpatient Prescriptions on File Prior to Encounter  Medication Sig Dispense Refill  . ALPRAZolam (XANAX) 0.5 MG tablet Take 0.5 mg by mouth at bedtime as needed for anxiety.    Marland Kitchen. spironolactone (ALDACTONE) 50 MG tablet Take 50 mg by mouth daily.    Marland Kitchen. venlafaxine XR (EFFEXOR-XR) 75 MG 24 hr capsule Take 1 capsule by mouth  daily with breakfast 90 capsule 0    Past Medical History  Diagnosis  Date  . Acne     History reviewed. No pertinent past surgical history.  Social History  Substance Use Topics  . Smoking status: Never Smoker   . Smokeless tobacco: None  . Alcohol Use: 0.0 oz/week    0 Standard drinks or equivalent per week     Comment: social    Family History  Problem Relation Age of Onset  . Hypertension Father   . Arthritis Father   . Heart disease Father   . Stroke Mother   . Seizures Mother   . Dementia Mother   . Cancer Mother     Cervical?    PE: Filed Vitals:   01/14/16 1432 01/14/16 1637 01/14/16 1826 01/14/16 1920  BP: 115/95 122/71 110/63 96/56  Pulse: 107 98 98 95  Temp: 97.5 F (36.4 C)     TempSrc: Oral     Resp: 18 16 16 20   Height: 5\' 9"  (1.753 m)     Weight: 108.863 kg (240 lb)     SpO2: 99% 9% 97% 97%   Patient appears to be in no acute distress  patient is alert and oriented x3 Atraumatic normocephalic head No cervical or supraclavicular lymphadenopathy appreciated No increased work of breathing, no audible wheezes/rhonchi Regular sinus rhythm/rate Abdomen is soft, nontender, nondistended, no CVA or suprapubic tenderness Lower extremities are symmetric without appreciable edema Grossly neurologically intact No identifiable skin lesions   Recent Labs  01/14/16 1453  WBC 38.2*  HGB 13.4  HCT 40.3    Recent Labs  01/14/16 1453  NA 135  K 4.0  CL 101  CO2 23  GLUCOSE 125*  BUN 16  CREATININE 1.18*  CALCIUM 8.6*   No results for input(s): LABPT, INR in the last 72 hours. No results for input(s): LABURIN in the last 72 hours. Results for orders placed or performed in visit on 10/11/15  Urine culture     Status: None   Collection Time: 10/11/15  9:33 AM  Result Value Ref Range Status   Culture ESCHERICHIA COLI  Final   Colony Count >=100,000 COLONIES/ML  Final   Organism ID, Bacteria ESCHERICHIA COLI  Final    Comment: Confirmed Extended Spectrum Beta-Lactamase Producer (ESBL)       Susceptibility    Escherichia coli -  (no method available)    AMPICILLIN >=32 Resistant     AMOX/CLAVULANIC 4 Sensitive     AMPICILLIN/SULBACTAM 16 Intermediate     PIP/TAZO <=4 Sensitive     IMIPENEM <=0.25 Sensitive     CEFAZOLIN >=64 Resistant     CEFTRIAXONE >=64 Resistant     CEFTAZIDIME 16 Resistant     CEFEPIME  Resistant     GENTAMICIN <=1 Sensitive     TOBRAMYCIN <=1 Sensitive     CIPROFLOXACIN >=4 Resistant     LEVOFLOXACIN >=8 Resistant     NITROFURANTOIN <=16 Sensitive     TRIMETH/SULFA* <=20 Sensitive      * NR=NOT REPORTABLE,SEE COMMENTORAL therapy:A cefazolin MIC of <32 predicts susceptibility to the oral agents cefaclor,cefdinir,cefpodoxime,cefprozil,cefuroxime,cephalexin,and loracarbef when used for therapy of uncomplicated UTIs due to E.coli,K.pneumomiae,and P.mirabilis. PARENTERAL therapy: A cefazolinMIC of >8 indicates resistance to parenteralcefazolin. An alternate test method must beperformed to confirm susceptibility to parenteralcefazolin.    Imaging: I independently reviewed the patient's CT scan which demonstrates a large obstructing stone in the right UPJ. She also has a nonobstructing stone in the right lower pole as well as 2 smaller stones more distal in the ureter to the UPJ. There is also some perinephric stranding consistent with inflammatory reaction.  Imp: The patient has an impassable stone with evidence for urinary tract infection.  Recommendations: Given the patient's large stone, poorly managed pain, and her white count 34,000 with severe pyuria I recommended the patient consider proceeding to the operative theater for urgent right ureteral stent placement. I discussed the rationale behind this and caution the patient that she would likely get sicker and potentially develop a blood infection if we waited. I discussed the details of the operation quite clearly for her and her significant other. Specifically, I described the cystoscopy and stent placement. The patient  understands that she will need at least 1 follow-up surgery and possibly staged procedure given the amount of stone that she has in the right kidney. She also understands the likelihood of stent discomfort and the possibility of developing worsening infection as a result of the surgery.    Berniece Salines W

## 2016-01-14 NOTE — ED Provider Notes (Signed)
[facility] Emergency Department Provider Note  ____________________________________________  Time seen: Approximately 440 PM  I have reviewed the triage vital signs and the nursing notes.   HISTORY  Chief Complaint Abdominal Pain    HPI Molly Miller is a 35 y.o. female with a history of anxiety who is presenting to the emergency department today with right back pain which is since radiated around to the right upper quadrant of the abdomen. Says that the back pain has been there for 3 weeks and intermittent. However, over the past day she has had worsening pain that is now radiated around to her right upper quadrant. She admits to nausea but no vomiting. Has not eaten over the past day. No diarrhea. Does not wish to have any pain medication at this time. Denies any chest pain or shortness of breath.   Past Medical History  Diagnosis Date  . Acne     Patient Active Problem List   Diagnosis Date Noted  . Anxiety and depression 03/30/2015  . Obesity 03/30/2015    History reviewed. No pertinent past surgical history.  Current Outpatient Rx  Name  Route  Sig  Dispense  Refill  . ALPRAZolam (XANAX) 0.5 MG tablet   Oral   Take 0.5 mg by mouth at bedtime as needed for anxiety.         . fluconazole (DIFLUCAN) 150 MG tablet   Oral   Take 1 tablet (150 mg total) by mouth once.   1 tablet   0   . spironolactone (ALDACTONE) 50 MG tablet   Oral   Take 50 mg by mouth daily.         Marland Kitchen sulfamethoxazole-trimethoprim (BACTRIM DS,SEPTRA DS) 800-160 MG tablet   Oral   Take 1 tablet by mouth 2 (two) times daily.   10 tablet   0   . venlafaxine XR (EFFEXOR-XR) 75 MG 24 hr capsule      Take 1 capsule by mouth  daily with breakfast   90 capsule   0     Allergies Review of patient's allergies indicates no known allergies.  Family History  Problem Relation Age of Onset  . Hypertension Father   . Arthritis Father   . Heart disease Father   .  Stroke Mother   . Seizures Mother   . Dementia Mother   . Cancer Mother     Cervical?    Social History Social History  Substance Use Topics  . Smoking status: Never Smoker   . Smokeless tobacco: None  . Alcohol Use: 0.0 oz/week    0 Standard drinks or equivalent per week     Comment: social    Review of Systems Constitutional: No fever/chills Eyes: No visual changes. ENT: No sore throat. Cardiovascular: Denies chest pain. Respiratory: Denies shortness of breath. Gastrointestinal:  no vomiting.  No diarrhea.  No constipation. Genitourinary: Negative for dysuria. Musculoskeletal: Negative for back pain. Skin: Negative for rash. Neurological: Negative for headaches, focal weakness or numbness.  10-point ROS otherwise negative.  ____________________________________________   PHYSICAL EXAM:  VITAL SIGNS: ED Triage Vitals  Enc Vitals Group     BP 01/14/16 1432 115/95 mmHg     Pulse Rate 01/14/16 1432 107     Resp 01/14/16 1432 18     Temp 01/14/16 1432 97.5 F (36.4 C)     Temp Source 01/14/16 1432 Oral     SpO2 01/14/16 1432 99 %     Weight 01/14/16 1432 240 lb (  108.863 kg)     Height 01/14/16 1432  (1.753 m)     Head Cir --      Peak Flow --      Pain Score 01/14/16 1448 7     Pain Loc --      Pain Edu? --      Excl. in GC? --     Constitutional: Alert and oriented. Well appearing and in no acute distress. Eyes: Conjunctivae are normal. PERRL. EOMI. Head: Atraumatic. Nose: No congestion/rhinnorhea. Mouth/Throat: Mucous membranes are moist.   Neck: No stridor.   Cardiovascular: Normal rate, regular rhythm. Grossly normal heart sounds.  Good peripheral circulation. Respiratory: Normal respiratory effort.  No retractions. Lungs CTAB. Gastrointestinal: Soft with right upper quadrant abdominal tenderness with a positive Murphy sign. No distention. No abdominal bruits. Musculoskeletal: No lower extremity tenderness nor edema.  No joint  effusions. Neurologic:  Normal speech and language. No gross focal neurologic deficits are appreciated.  Skin:  Skin is warm, dry and intact. No rash noted. Psychiatric: Mood and affect are normal. Speech and behavior are normal.  ____________________________________________   LABS (all labs ordered are listed, but only abnormal results are displayed)  Labs Reviewed  COMPREHENSIVE METABOLIC PANEL - Abnormal; Notable for the following:    Glucose, Bld 125 (*)    Creatinine, Ser 1.18 (*)    Calcium 8.6 (*)    Total Bilirubin 1.7 (*)    GFR calc non Af Amer 59 (*)    All other components within normal limits  CBC - Abnormal; Notable for the following:    WBC 38.2 (*)    All other components within normal limits  URINALYSIS COMPLETEWITH MICROSCOPIC ([facility] ONLY) - Abnormal; Notable for the following:    Color, Urine AMBER (*)    APPearance CLOUDY (*)    Bilirubin Urine 1+ (*)    Ketones, ur 2+ (*)    Hgb urine dipstick 1+ (*)    Protein, ur >500 (*)    Leukocytes, UA 2+ (*)    Bacteria, UA RARE (*)    Squamous Epithelial / LPF 6-30 (*)    All other components within normal limits  DIFFERENTIAL - Abnormal; Notable for the following:    Neutro Abs 36.2 (*)    Lymphs Abs 0.6 (*)    Monocytes Absolute 1.6 (*)    All other components within normal limits  CULTURE, BLOOD (ROUTINE X 2)  CULTURE, BLOOD (ROUTINE X 2)  URINE CULTURE  LIPASE, BLOOD  LACTIC ACID, PLASMA  CBC WITH DIFFERENTIAL/PLATELET  POC URINE PREG, ED  POCT PREGNANCY, URINE   ____________________________________________  EKG   ____________________________________________  RADIOLOGY  IMPRESSION: 1. 13 x 8 x 13 mm stone at the right UPJ is associated with 2 additional smaller stones located in the proximal right ureter. Together, these stones generate mild to moderate right hydronephrosis with perinephric edema. 2. 5 x 5 x 7 mm right lower pole renal stone. 3. Tiny nonobstructing stone left  kidney.   Electronically Signed By: Kennith Center M.D. On: 01/14/2016 19:02  IMPRESSION: Mild fatty infiltration of the liver with area of focal fatty sparing.  Mild right renal hydronephrosis incidentally noted.   Electronically Signed By: Charlett Nose M.D. On: 01/14/2016 17:54 ____________________________________________   PROCEDURES  ____________________________________________   INITIAL IMPRESSION / ASSESSMENT AND PLAN / ED COURSE  Pertinent labs & imaging results that were available during my care of the patient were reviewed by me and considered in my medical decision making (see  chart for details).  ----------------------------------------- 7:53 PM on 01/14/2016 -----------------------------------------  Patient without any distress at this time. Updated her about the CAT scan as well as ultrasound findings. I discussed the CAT scan findings with Dr. Marlou PorchHerrick of the urology service. He says that the patient will require a stent in several hours. He says that he will be calling the OR to schedule her procedure. On reevaluation the patient's systolic blood pressure was 119.   ____________________________________________   FINAL CLINICAL IMPRESSION(S) / ED DIAGNOSES  Final diagnoses:  RUQ abdominal pain  Right flank pain  Ureterolithiasis. Pyelonephritis.    Myrna Blazeravid Matthew Schaevitz, MD 01/14/16 [phone]

## 2016-01-14 NOTE — Progress Notes (Signed)
Pharmacy Antibiotic Note  Molly Miller is a 35 y.o. female admitted on 01/14/2016 with intra-abdominal infection.  Pharmacy has been consulted for piperacillin/tazobactam dosing.  Plan: Zosyn 3.375g IV q8h (4 hour infusion).  Height: 5\' 9"  (175.3 cm) Weight: 240 lb (108.863 kg) IBW/kg (Calculated) : 66.2  Temp (24hrs), Avg:97.5 F (36.4 C), Min:97.5 F (36.4 C), Max:97.5 F (36.4 C)   Recent Labs Lab 01/14/16 1453  WBC 38.2*  CREATININE 1.18*    Estimated Creatinine Clearance: 88.3 mL/min (by C-G formula based on Cr of 1.18).    No Known Allergies  Antimicrobials this admission: Piperacillin/tazobactam 3/12 >>   Microbiology results: 3/12 BCx: Sent 3/12 UCx: Sent   Thank you for allowing pharmacy to be a part of this patient's care.  Molly Miller, PharmD Clinical Pharmacist 01/14/2016 5:21 PM

## 2016-01-14 NOTE — Transfer of Care (Signed)
Immediate Anesthesia Transfer of Care Note  Patient: Molly Miller  Procedure(s) Performed: Procedure(s): CYSTOSCOPY WITH STENT PLACEMENT (Right)  Patient Location: PACU  Anesthesia Type:General  Level of Consciousness: awake, alert  and oriented  Airway & Oxygen Therapy: Patient Spontanous Breathing  Post-op Assessment: Report given to RN  Post vital signs: Reviewed and stable  Last Vitals:  Filed Vitals:   01/14/16 1920 01/14/16 2131  BP: 96/56 122/86  Pulse: 95 98  Temp:  36.4 C  Resp: 20 16    Complications: No apparent anesthesia complications

## 2016-01-14 NOTE — Anesthesia Preprocedure Evaluation (Signed)
Anesthesia Evaluation  Patient identified by MRN, date of birth, ID band Patient awake    Reviewed: Allergy & Precautions, NPO status , Patient's Chart, lab work & pertinent test results, reviewed documented beta blocker date and time   Airway Mallampati: III  TM Distance: >3 FB     Dental  (+) Chipped   Pulmonary           Cardiovascular      Neuro/Psych PSYCHIATRIC DISORDERS Depression    GI/Hepatic   Endo/Other    Renal/GU      Musculoskeletal   Abdominal   Peds  Hematology   Anesthesia Other Findings Obese.  Reproductive/Obstetrics                             Anesthesia Physical Anesthesia Plan  ASA: III  Anesthesia Plan: General   Post-op Pain Management:    Induction: Intravenous  Airway Management Planned: Oral ETT and LMA  Additional Equipment:   Intra-op Plan:   Post-operative Plan:   Informed Consent: I have reviewed the patients History and Physical, chart, labs and discussed the procedure including the risks, benefits and alternatives for the proposed anesthesia with the patient or authorized representative who has indicated his/her understanding and acceptance.     Plan Discussed with: CRNA  Anesthesia Plan Comments:         Anesthesia Quick Evaluation

## 2016-01-14 NOTE — Discharge Instructions (Signed)
DISCHARGE INSTRUCTIONS FOR KIDNEY STONE/URETERAL STENT   MEDICATIONS:  1.  Resume all your other meds from home - except do not take any extra narcotic pain meds that you may have at home.  2. Trospium is to prevent bladder spasms and help reduce urinary frequency. 3. Pyridium is to help with the burning/stinging when you urinate. 4. Tramadol is for moderate/severe pain, otherwise taking Ibuprofen or Tylenol will help treat your pain.   5. Take Bactrim DS twice daily x 7 days   ACTIVITY:  1. No strenuous activity x 1week  2. No driving while on narcotic pain medications  3. Drink plenty of water  4. Continue to walk at home - you can still get blood clots when you are at home, so keep active, but don't over do it.  5. May return to work/school tomorrow or when you feel ready   BATHING:  1. You can shower and we recommend daily showers     SIGNS/SYMPTOMS TO CALL:  Please call us if you have a fever greater than 101.5, uncontrolled nausea/vomiting, uncontrolled pain, dizziness, unable to urinate, bloody urine, chest pain, shortness of breath, leg swelling, leg pain, redness around wound, drainage from wound, or any other concerns or questions.   You can reach us at [phone].   FOLLOW-UP:  1. [facility] will be [facility] early next week for a f/u appointment to discuss removal of all your stones.

## 2016-01-14 NOTE — ED Notes (Signed)
Patient unable to void at this time.  She was given a specimen cup and instructions

## 2016-01-14 NOTE — Op Note (Signed)
Preoperative diagnosis:  1. Right infected proximal ureteral stones   Postoperative diagnosis:  1. same   Procedure:  1. Cystoscopy 2. right ureteral stent placement 3. right retrograde pyelography with interpretation   Surgeon: Crist FatBenjamin W. Herrick, MD  Anesthesia: General  Complications: None  Intraoperative findings: Stone noted on fluoroscopy at the right UPJ, the ureter distal to her stone was normal, there was a filling defect at the location of the stone cluster.  There was moderate hydronephrosis.  EBL: Minimal  Specimens: Urine from right renal pelvis  Indication: Molly Miller is a 35 y.o. patient with infected right ureteral stone. After reviewing the management options for treatment, he elected to proceed with the above surgical procedure(s). We have discussed the potential benefits and risks of the procedure, side effects of the proposed treatment, the likelihood of the patient achieving the goals of the procedure, and any potential problems that might occur during the procedure or recuperation. Informed consent has been obtained.  Description of procedure:  The patient was taken to the operating room and general anesthesia was induced.  The patient was placed in the dorsal lithotomy position, prepped and draped in the usual sterile fashion, and preoperative antibiotics were administered. A preoperative time-out was performed.   Cystourethroscopy was performed.  The patient's urethra was examined and was normal. The bladder was then systematically examined in its entirety. There was no evidence for any bladder tumors, stones, or other mucosal pathology.    Attention then turned to the rightureteral orifice and a ureteral catheter was used to intubate the ureteral orifice.  Omnipaque contrast was injected through the ureteral catheter and a retrograde pyelogram was performed with findings as dictated above.  A 0.38 sensor guidewire was then advanced up the right ureter  into the renal pelvis under fluoroscopic guidance.  The wire was then backloaded through the cystoscope and a ureteral stent was advance over the wire using Seldinger technique.  The stent was positioned appropriately under fluoroscopic and cystoscopic guidance.  The wire was then removed with an adequate stent curl noted in the renal pelvis as well as in the bladder.  The bladder was then emptied and the procedure ended.  The patient appeared to tolerate the procedure well and without complications.  The patient was able to be awakened and transferred to the recovery unit in satisfactory condition.    Crist FatBenjamin W. Herrick, M.D.

## 2016-01-15 ENCOUNTER — Encounter: Payer: Self-pay | Admitting: Urology

## 2016-01-15 NOTE — Anesthesia Postprocedure Evaluation (Signed)
Anesthesia Post Note  Patient: Molly Miller  Procedure(s) Performed: Procedure(s) (LRB): RIGHT CYSTOSCOPY, RIGHT RETROGRADE, RIGHT URETERAL STENT PLACEMENT (Right)  Patient location during evaluation: PACU Anesthesia Type: General Level of consciousness: awake and alert Pain management: pain level controlled Vital Signs Assessment: post-procedure vital signs reviewed and stable Respiratory status: spontaneous breathing, nonlabored ventilation, respiratory function stable and patient connected to nasal cannula oxygen Cardiovascular status: blood pressure returned to baseline and stable Postop Assessment: no signs of nausea or vomiting Anesthetic complications: no    Last Vitals:  Filed Vitals:   01/14/16 2314 01/14/16 2335  BP:  128/74  Pulse: 108 106  Temp: 37.2 C 37.2 C  Resp: 20 20    Last Pain:  Filed Vitals:   01/14/16 2348  PainSc: 0-No pain                 Deshun Sedivy S

## 2016-01-16 LAB — BLOOD CULTURE ID PANEL (REFLEXED)
Acinetobacter baumannii: NOT DETECTED
CANDIDA GLABRATA: NOT DETECTED
CANDIDA KRUSEI: NOT DETECTED
CANDIDA PARAPSILOSIS: NOT DETECTED
CARBAPENEM RESISTANCE: NOT DETECTED
Candida albicans: NOT DETECTED
Candida tropicalis: NOT DETECTED
ESCHERICHIA COLI: DETECTED — AB
Enterobacter cloacae complex: NOT DETECTED
Enterobacteriaceae species: DETECTED — AB
Enterococcus species: NOT DETECTED
Haemophilus influenzae: NOT DETECTED
KLEBSIELLA OXYTOCA: NOT DETECTED
KLEBSIELLA PNEUMONIAE: NOT DETECTED
Listeria monocytogenes: NOT DETECTED
Methicillin resistance: NOT DETECTED
Neisseria meningitidis: NOT DETECTED
PSEUDOMONAS AERUGINOSA: NOT DETECTED
Proteus species: NOT DETECTED
SERRATIA MARCESCENS: NOT DETECTED
STAPHYLOCOCCUS SPECIES: NOT DETECTED
STREPTOCOCCUS AGALACTIAE: NOT DETECTED
STREPTOCOCCUS SPECIES: NOT DETECTED
Staphylococcus aureus (BCID): NOT DETECTED
Streptococcus pneumoniae: NOT DETECTED
Streptococcus pyogenes: NOT DETECTED
Vancomycin resistance: NOT DETECTED

## 2016-01-16 NOTE — Progress Notes (Signed)
Spoke with Karena AddisonLindsey Overton @ [facility] and she states that she will get in contact with patient about blood culture results. Please see Crist FatHannah Wang, PharmD's note for any additional information.    Demetrius Charityeldrin D. Atharva Mirsky, PharmD

## 2016-01-16 NOTE — Progress Notes (Signed)
BCID Follow up  Micro lab called with 1/4 bottles with E Coli growing. Pt has been discharged. Called on-call urologist, Dr. Annabell HowellsWrenn, who recommended calling [facility] at 830 am after office opens. Her d/c AVS showed Bactrim DS prescribed.

## 2016-01-17 ENCOUNTER — Telehealth: Payer: Self-pay | Admitting: Obstetrics and Gynecology

## 2016-01-17 LAB — URINE CULTURE

## 2016-01-17 NOTE — Telephone Encounter (Signed)
I called and left message with patient regarding her urine culture results and susceptibilities. He was positive for ESBL Escherichia coli that was susceptible to Bactrim. On her voicemail I advised her to continue the Bactrim and follow up tomorrow as scheduled with an M.D. to discuss surgical options. Her blood cultures susceptibilities are still pending.

## 2016-01-18 ENCOUNTER — Ambulatory Visit (INDEPENDENT_AMBULATORY_CARE_PROVIDER_SITE_OTHER): Payer: 59 | Admitting: Urology

## 2016-01-18 ENCOUNTER — Encounter: Payer: Self-pay | Admitting: Urology

## 2016-01-18 VITALS — BP 114/77 | HR 89 | Ht 69.0 in | Wt 240.5 lb

## 2016-01-18 DIAGNOSIS — N200 Calculus of kidney: Secondary | ICD-10-CM

## 2016-01-18 MED ORDER — SULFAMETHOXAZOLE-TRIMETHOPRIM 800-160 MG PO TABS: 1.0000 | ORAL_TABLET | Freq: Two times a day (BID) | ORAL | Status: DC

## 2016-01-18 NOTE — Progress Notes (Signed)
01/18/2016 9:46 AM   Molly Miller 01-Jul-1981 [id number]  Referring provider: [name], NP [address] [place], [place] [address]  Chief Complaint  Patient presents with  . New Patient (Initial Visit)    Renal stone,     HPI: The patient is a 35 year old female with a 13 mm right UPJ stone status post right ureteral stent in the setting of leukocytosis and pyuria. She presents today to discuss definitive stone management. She also has a few smaller stones on the right side that are nonobstructing in the renal pelvis and a distal ureteral fragment. Her urine culture did grow Escherichia coli sensitive to Bactrim. She also positive blood culture with Escherichia coli. She is on a course of Bactrim at this time.    PMH: Past Medical History  Diagnosis Date  . Acne   . Kidney stone     Surgical History: Past Surgical History  Procedure Laterality Date  . Cystoscopy with stent placement Right 01/14/2016    Procedure: RIGHT CYSTOSCOPY, RIGHT RETROGRADE, RIGHT URETERAL STENT PLACEMENT;  Surgeon: Crist Fat, MD;  Location: [facility] ORS;  Service: Urology;  Laterality: Right;    Home Medications:    Medication List       This list is accurate as of: 01/18/16  9:46 AM.  Always use your most recent med list.               ALPRAZolam 0.5 MG tablet  Commonly known as:  XANAX  Take 0.5 mg by mouth at bedtime as needed for anxiety.     CoQ10 100 MG Caps  Take 1 capsule by mouth daily at 6 (six) AM.     ibuprofen 200 MG tablet  Commonly known as:  ADVIL,MOTRIN  Take 600 mg by mouth every 4 (four) hours as needed.     phenazopyridine 200 MG tablet  Commonly known as:  PYRIDIUM  Take 1 tablet (200 mg total) by mouth 3 (three) times daily as needed for pain.     spironolactone 50 MG tablet  Commonly known as:  ALDACTONE  Take 50 mg by mouth daily.     sulfamethoxazole-trimethoprim 800-160 MG tablet  Commonly known as:  BACTRIM DS,SEPTRA DS  Take 1 tablet  by mouth 2 (two) times daily.     sulfamethoxazole-trimethoprim 800-160 MG tablet  Commonly known as:  BACTRIM DS,SEPTRA DS  Take 1 tablet by mouth every 12 (twelve) hours.     traMADol 50 MG tablet  Commonly known as:  ULTRAM  Take 1-2 tablets (50-100 mg total) by mouth every 6 (six) hours as needed for moderate pain.     Trospium Chloride 60 MG Cp24  Take 1 capsule (60 mg total) by mouth daily.     venlafaxine XR 75 MG 24 hr capsule  Commonly known as:  EFFEXOR-XR  Take 1 capsule by mouth  daily with breakfast     vitamin C 500 MG tablet  Commonly known as:  ASCORBIC ACID  Take 500 mg by mouth daily.        Allergies: No Known Allergies  Family History: Family History  Problem Relation Age of Onset  . Hypertension Father   . Arthritis Father   . Heart disease Father   . Stroke Mother   . Seizures Mother   . Dementia Mother   . Cancer Mother     Cervical?  . Nephrolithiasis Mother     Social History:  reports that she has never smoked.  She does not have any smokeless tobacco history on file. She reports that she drinks alcohol. She reports that she does not use illicit drugs.  ROS: UROLOGY Frequent Urination?: Yes Hard to postpone urination?: Yes Burning/pain with urination?: Yes Get up at night to urinate?: Yes Leakage of urine?: No Urine stream starts and stops?: No Trouble starting stream?: No Do you have to strain to urinate?: No Blood in urine?: Yes Urinary tract infection?: No Sexually transmitted disease?: No Injury to kidneys or bladder?: No Painful intercourse?: No Weak stream?: Yes Currently pregnant?: No Vaginal bleeding?: No Last menstrual period?: No  Gastrointestinal Nausea?: No Vomiting?: No Indigestion/heartburn?: No Diarrhea?: No Constipation?: Yes  Constitutional Fever: No Night sweats?: No Weight loss?: No Fatigue?: No  Skin Skin rash/lesions?: No Itching?: No  Eyes Blurred vision?: No Double vision?:  No  Ears/Nose/Throat Sore throat?: No Sinus problems?: No  Hematologic/Lymphatic Swollen glands?: No Easy bruising?: No  Cardiovascular Leg swelling?: No Chest pain?: No  Respiratory Cough?: No Shortness of breath?: No  Endocrine Excessive thirst?: No  Musculoskeletal Back pain?: No Joint pain?: No  Neurological Headaches?: No Dizziness?: No  Psychologic Depression?: Yes Anxiety?: Yes  Physical Exam: BP 114/77 mmHg  Pulse 89  Ht 5\' 9"  (1.753 m)  Wt 240 lb 8 oz (109.09 kg)  BMI 35.50 kg/m2  LMP 12/31/2015  Constitutional:  Alert and oriented, No acute distress. HEENT: [place] AT, moist mucus membranes.  Trachea midline, no masses. Cardiovascular: No clubbing, cyanosis, or edema. Respiratory: Normal respiratory effort, no increased work of breathing. GI: Abdomen is soft, nontender, nondistended, no abdominal masses GU: No CVA tenderness.  Skin: No rashes, bruises or suspicious lesions. Lymph: No cervical or inguinal adenopathy. Neurologic: Grossly intact, no focal deficits, moving all 4 extremities. Psychiatric: Normal mood and affect.  Laboratory Data: Lab Results  Component Value Date   WBC 38.2* 01/14/2016   HGB 13.4 01/14/2016   HCT 40.3 01/14/2016   MCV 85.3 01/14/2016   PLT 324 01/14/2016    Lab Results  Component Value Date   CREATININE 1.18* 01/14/2016    No results found for: PSA  No results found for: TESTOSTERONE  No results found for: HGBA1C  Urinalysis    Component Value Date/Time   COLORURINE AMBER* 01/14/2016 1620   APPEARANCEUR CLOUDY* 01/14/2016 1620   LABSPEC 1.030 01/14/2016 1620   PHURINE 5.0 01/14/2016 1620   GLUCOSEU NEGATIVE 01/14/2016 1620   HGBUR 1+* 01/14/2016 1620   BILIRUBINUR 1+* 01/14/2016 1620   BILIRUBINUR negative 10/11/2015 0906   KETONESUR 2+* 01/14/2016 1620   PROTEINUR >500* 01/14/2016 1620   PROTEINUR +- 10/11/2015 0906   UROBILINOGEN negative 10/11/2015 0906   NITRITE NEGATIVE 01/14/2016 1620    NITRITE positive 10/11/2015 0906   LEUKOCYTESUR 2+* 01/14/2016 1620    Pertinent Imaging: CLINICAL DATA: Low back and right-sided pain that started yesterday and radiates rounded the abdomen. Nausea.  EXAM: CT ABDOMEN AND PELVIS WITHOUT CONTRAST  TECHNIQUE: Multidetector CT imaging of the abdomen and pelvis was performed following the standard protocol without IV contrast.  COMPARISON: 11/27/2007  FINDINGS: Lower chest: Compressive atelectasis posterior right lung base.  Hepatobiliary: No focal abnormality in the liver on this study without intravenous contrast. No evidence of hepatomegaly. There is no evidence for gallstones, gallbladder wall thickening, or pericholecystic fluid. No intrahepatic or extrahepatic biliary dilation.  Pancreas: No focal mass lesion. No dilatation of the main duct. No intraparenchymal cyst. No peripancreatic edema.  Spleen: No splenomegaly. No focal mass lesion.  Adrenals/Urinary Tract:  No adrenal nodule or mass. Right kidney is enlarged and edematous with perinephric edema. There is mild right hydronephrosis. 13 x 8 x 13 mm stone is positioned at the right UPJ. Two additional stones are seen distal to the right UPJ, in the proximal right ureter. The more proximal of the 2 which is just below the UPJ measures 2 x 2 x 6 mm. The more distal of the 2 measures 2 x 4 x 7 mm. 5 x 5 x 7 mm stone is identified in the lower pole of the right kidney  1-2 mm nonobstructing stone is seen in the upper pole of the left kidney. No left ureteral stones no bladder stones.  Stomach/Bowel: Stomach is nondistended. No gastric wall thickening. No evidence of outlet obstruction. Duodenum is normally positioned as is the ligament of Treitz. No small bowel wall thickening. No small bowel dilatation. The terminal ileum is normal. The appendix is normal. 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  Vascular/Lymphatic: No abdominal aortic aneurysm. No abdominal aortic atherosclerotic calcification. There is no gastrohepatic or hepatoduodenal ligament lymphadenopathy. No intraperitoneal or retroperitoneal lymphadenopathy. No pelvic sidewall lymphadenopathy.  Reproductive: The uterus has normal CT imaging appearance. There is no adnexal mass.  Other: No intraperitoneal free fluid.  Musculoskeletal: Bone windows reveal no worrisome lytic or sclerotic osseous lesions.  IMPRESSION: 1. 13 x 8 x 13 mm stone at the right UPJ is associated with 2 additional smaller stones located in the proximal right ureter. Together, these stones generate mild to moderate right hydronephrosis with perinephric edema. 2. 5 x 5 x 7 mm right lower pole renal stone. 3. Tiny nonobstructing stone left kidney.    Assessment & Plan:   I discussed possible treatments options with the patient which include lithotripsy as well as right ureteroscopy for stone management. Given that showed he has ureteral stent and multiple smaller stones in the kidney she will be best served in my opinion with ureteroscopy as this will give her the best chance of being stone free with one operation. We discussed risks, benefits, indications of the surgery. She understands the risks include but are not limited to bleeding, infection, iatrogenic injury. All of her questions were answered and she elects to proceed  1. Right nephrolithiasis -Cystoscopy, right ureteroscopy, laser lithotripsy, stone basketing, right retrograde pyelogram, and right ureteral stent exchange -She will need a negative urine culture prior to proceeding. -Finish course of Bactrim  Return for surgery.  Hildred Laser, MD  [facility] [address] [place], [place] [address] [phone]

## 2016-01-19 ENCOUNTER — Telehealth: Payer: Self-pay | Admitting: Radiology

## 2016-01-19 LAB — CULTURE, BLOOD (ROUTINE X 2): CULTURE: NO GROWTH

## 2016-01-19 NOTE — Telephone Encounter (Signed)
LMOM. Need to notify pt of surgery information. 

## 2016-01-22 NOTE — Telephone Encounter (Signed)
Notified pt of surgery scheduled 02/14/16, pre-admit testing on 01/30/16 @9 :45 and to call day prior to surgery for arrival time to SDS. Pt voices understanding.

## 2016-01-24 ENCOUNTER — Ambulatory Visit (INDEPENDENT_AMBULATORY_CARE_PROVIDER_SITE_OTHER): Payer: 59 | Admitting: Psychology

## 2016-01-24 DIAGNOSIS — F4323 Adjustment disorder with mixed anxiety and depressed mood: Secondary | ICD-10-CM | POA: Diagnosis not present

## 2016-01-26 ENCOUNTER — Ambulatory Visit: Payer: Self-pay

## 2016-01-30 ENCOUNTER — Encounter
Admission: RE | Admit: 2016-01-30 | Discharge: 2016-01-30 | Disposition: A | Discharge status: Home / Self Care | Payer: 59 | Source: Ambulatory Visit | Attending: Urology | Admitting: Urology

## 2016-01-30 DIAGNOSIS — Z01812 Encounter for preprocedural laboratory examination: Secondary | ICD-10-CM | POA: Insufficient documentation

## 2016-01-30 HISTORY — DX: Depression, unspecified: F32.A

## 2016-01-30 HISTORY — DX: Anxiety disorder, unspecified: F41.9

## 2016-01-30 HISTORY — DX: Major depressive disorder, single episode, unspecified: F32.9

## 2016-01-30 LAB — BASIC METABOLIC PANEL
Anion gap: 6 (ref 5–15)
BUN: 15 mg/dL (ref 6–20)
CHLORIDE: 103 mmol/L (ref 101–111)
CO2: 23 mmol/L (ref 22–32)
CREATININE: 1.09 mg/dL — AB (ref 0.44–1.00)
Calcium: 9.1 mg/dL (ref 8.9–10.3)
GFR calc Af Amer: >60 mL/min (ref >60)
GFR calc non Af Amer: >60 mL/min (ref >60)
GLUCOSE: 86 mg/dL (ref 65–99)
Potassium: 4.6 mmol/L (ref 3.5–5.1)
SODIUM: 132 mmol/L — AB (ref 135–145)

## 2016-01-30 LAB — CBC
HCT: 38.5 % (ref 35.0–47.0)
HEMOGLOBIN: 12.8 g/dL (ref 12.0–16.0)
MCH: 27.8 pg (ref 26.0–34.0)
MCHC: 33.1 g/dL (ref 32.0–36.0)
MCV: 84.1 fL (ref 80.0–100.0)
Platelets: 279 10*3/uL (ref 150–440)
RBC: 4.58 MIL/uL (ref 3.80–5.20)
RDW: 13.4 % (ref 11.5–14.5)
WBC: 8.4 10*3/uL (ref 3.6–11.0)

## 2016-01-30 NOTE — Patient Instructions (Signed)
  Your procedure is scheduled on: 02/14/16 Report to Day Surgery. To find out your arrival time please call [phone] between 1PM - 3PM on 02/13/16.  Remember: Instructions that are not followed completely may result in serious medical risk, up to and including death, or upon the discretion of your surgeon and anesthesiologist your surgery may need to be rescheduled.    _x__ 1. Do not eat food or drink liquids after midnight. No gum chewing or hard candies.     _x___ 2. No Alcohol for 24 hours before or after surgery.   ____ 3. Bring all medications with you on the day of surgery if instructed.    __x__ 4. Notify your doctor if there is any change in your medical condition     (cold, fever, infections).     Do not wear jewelry, make-up, hairpins, clips or nail polish.  Do not wear lotions, powders, or perfumes. You may wear deodorant.  Do not shave 48 hours prior to surgery. Men may shave face and neck.  Do not bring valuables to the hospital.    [facility] not [facility] for any belongings or valuables.               Contacts, dentures or bridgework may not be worn into surgery.  Leave your suitcase in the car. After surgery it may be brought to your room.  For patients admitted to the hospital, discharge time is determined by your                treatment team.   Patients discharged the day of surgery will not be allowed to drive home.   Please read over the following fact sheets that you were given:   Surgical Site Infection Prevention   ____ Take these medicines the morning of surgery with A SIP OF WATER:    1. effexor  2.   3.   4.  5.  6.  ____ Fleet Enema (as directed)   ____ Use CHG Soap as directed  ____ Use inhalers on the day of surgery  ____ Stop metformin 2 days prior to surgery    ____ Take 1/2 of usual insulin dose the night before surgery and none on the morning of surgery.   ____ Stop Coumadin/Plavix/aspirin on   __x__ Stop  Anti-inflammatories on 02/07/16   __x__ Stop supplements until after surgery.  02/07/16  ____ Bring C-Pap to the hospital.

## 2016-01-31 LAB — URINE CULTURE
CULTURE: NO GROWTH
Special Requests: NORMAL

## 2016-02-01 NOTE — Pre-Procedure Instructions (Signed)
Dr. Jorja LoaBudzyn's office notified of urine culture (E Coli) Molly Miller states she will call if needs treatment before surgery.

## 2016-02-06 ENCOUNTER — Other Ambulatory Visit: Payer: Self-pay | Admitting: Primary Care

## 2016-02-06 DIAGNOSIS — F329 Major depressive disorder, single episode, unspecified: Secondary | ICD-10-CM

## 2016-02-06 DIAGNOSIS — F419 Anxiety disorder, unspecified: Principal | ICD-10-CM

## 2016-02-06 DIAGNOSIS — F32A Depression, unspecified: Secondary | ICD-10-CM

## 2016-02-06 NOTE — Telephone Encounter (Signed)
Electronically refill request for   venlafaxine XR (EFFEXOR-XR) 75 MG 24 hr capsule   Take 1 capsule by mouth daily with breakfast  Dispense: 90 capsule   Refills: 0     Last prescribed on 10/19/2015. Last seen on 12/28/2015. No future appt

## 2016-02-07 ENCOUNTER — Ambulatory Visit (INDEPENDENT_AMBULATORY_CARE_PROVIDER_SITE_OTHER): Payer: 59 | Admitting: Psychology

## 2016-02-07 DIAGNOSIS — F4323 Adjustment disorder with mixed anxiety and depressed mood: Secondary | ICD-10-CM | POA: Diagnosis not present

## 2016-02-14 ENCOUNTER — Ambulatory Visit: Payer: 59 | Admitting: Certified Registered"

## 2016-02-14 ENCOUNTER — Ambulatory Visit
Admission: RE | Admit: 2016-02-14 | Discharge: 2016-02-14 | Disposition: A | Discharge status: Home / Self Care | Payer: 59 | Source: Ambulatory Visit | Attending: Urology | Admitting: Urology

## 2016-02-14 ENCOUNTER — Encounter: Payer: Self-pay | Admitting: Anesthesiology

## 2016-02-14 ENCOUNTER — Encounter
Admission: RE | Disposition: A | Discharge status: Home / Self Care | Payer: Self-pay | Source: Ambulatory Visit | Attending: Urology

## 2016-02-14 DIAGNOSIS — Z6835 Body mass index (BMI) 35.0-35.9, adult: Secondary | ICD-10-CM | POA: Insufficient documentation

## 2016-02-14 DIAGNOSIS — Z8261 Family history of arthritis: Secondary | ICD-10-CM | POA: Diagnosis not present

## 2016-02-14 DIAGNOSIS — N132 Hydronephrosis with renal and ureteral calculous obstruction: Secondary | ICD-10-CM | POA: Diagnosis not present

## 2016-02-14 DIAGNOSIS — N200 Calculus of kidney: Secondary | ICD-10-CM | POA: Diagnosis not present

## 2016-02-14 DIAGNOSIS — R110 Nausea: Secondary | ICD-10-CM | POA: Diagnosis not present

## 2016-02-14 DIAGNOSIS — Z841 Family history of disorders of kidney and ureter: Secondary | ICD-10-CM | POA: Insufficient documentation

## 2016-02-14 DIAGNOSIS — Z820 Family history of epilepsy and other diseases of the nervous system: Secondary | ICD-10-CM | POA: Insufficient documentation

## 2016-02-14 DIAGNOSIS — E669 Obesity, unspecified: Secondary | ICD-10-CM | POA: Insufficient documentation

## 2016-02-14 DIAGNOSIS — Z8249 Family history of ischemic heart disease and other diseases of the circulatory system: Secondary | ICD-10-CM | POA: Diagnosis not present

## 2016-02-14 DIAGNOSIS — Z87442 Personal history of urinary calculi: Secondary | ICD-10-CM | POA: Diagnosis not present

## 2016-02-14 DIAGNOSIS — Z79899 Other long term (current) drug therapy: Secondary | ICD-10-CM | POA: Diagnosis not present

## 2016-02-14 DIAGNOSIS — Z823 Family history of stroke: Secondary | ICD-10-CM | POA: Insufficient documentation

## 2016-02-14 DIAGNOSIS — Z809 Family history of malignant neoplasm, unspecified: Secondary | ICD-10-CM | POA: Insufficient documentation

## 2016-02-14 HISTORY — PX: CYSTOSCOPY W/ URETERAL STENT PLACEMENT: SHX1429

## 2016-02-14 HISTORY — PX: URETEROSCOPY WITH HOLMIUM LASER LITHOTRIPSY: SHX6645

## 2016-02-14 HISTORY — PX: HOLMIUM LASER APPLICATION: SHX5852

## 2016-02-14 LAB — POCT PREGNANCY, URINE: Preg Test, Ur: NEGATIVE

## 2016-02-14 SURGERY — URETEROSCOPY, WITH LITHOTRIPSY USING HOLMIUM LASER
Anesthesia: General | Laterality: Right

## 2016-02-14 MED ORDER — FENTANYL CITRATE (PF) 100 MCG/2ML IJ SOLN
INTRAMUSCULAR | Status: AC
  Administered 2016-02-14: 25 ug via INTRAVENOUS
  Filled 2016-02-14: qty 2

## 2016-02-14 MED ORDER — FENTANYL CITRATE (PF) 100 MCG/2ML IJ SOLN
INTRAMUSCULAR | Status: DC | PRN
  Administered 2016-02-14 (×2): 50 ug via INTRAVENOUS

## 2016-02-14 MED ORDER — LIDOCAINE HCL (CARDIAC) 20 MG/ML IV SOLN
INTRAVENOUS | Status: DC | PRN
  Administered 2016-02-14: 100 mg via INTRAVENOUS

## 2016-02-14 MED ORDER — NEOSTIGMINE METHYLSULFATE 10 MG/10ML IV SOLN
INTRAVENOUS | Status: DC | PRN
  Administered 2016-02-14: 3 mg via INTRAVENOUS

## 2016-02-14 MED ORDER — GENTAMICIN IN SALINE 1.6-0.9 MG/ML-% IV SOLN
80.0000 mg | Freq: Once | INTRAVENOUS | Status: AC
  Administered 2016-02-14: 80 mg via INTRAVENOUS
  Filled 2016-02-14: qty 50

## 2016-02-14 MED ORDER — AMPICILLIN SODIUM 2 G IJ SOLR
2.0000 g | Freq: Once | INTRAMUSCULAR | Status: AC
  Administered 2016-02-14: 2 g via INTRAVENOUS
  Filled 2016-02-14: qty 2000

## 2016-02-14 MED ORDER — ROCURONIUM BROMIDE 100 MG/10ML IV SOLN
INTRAVENOUS | Status: DC | PRN
  Administered 2016-02-14: 20 mg via INTRAVENOUS

## 2016-02-14 MED ORDER — FAMOTIDINE 20 MG PO TABS
20.0000 mg | ORAL_TABLET | Freq: Once | ORAL | Status: AC
  Administered 2016-02-14: 20 mg via ORAL

## 2016-02-14 MED ORDER — GLYCOPYRROLATE 0.2 MG/ML IJ SOLN
INTRAMUSCULAR | Status: DC | PRN
  Administered 2016-02-14: .4 mg via INTRAVENOUS

## 2016-02-14 MED ORDER — ONDANSETRON HCL 4 MG/2ML IJ SOLN
INTRAMUSCULAR | Status: DC | PRN
  Administered 2016-02-14: 4 mg via INTRAVENOUS

## 2016-02-14 MED ORDER — ONDANSETRON HCL 4 MG/2ML IJ SOLN
4.0000 mg | Freq: Once | INTRAMUSCULAR | Status: DC | PRN
Start: 2016-02-14 — End: 2016-02-14

## 2016-02-14 MED ORDER — HYDROCODONE-ACETAMINOPHEN 5-325 MG PO TABS: 1.0000 | ORAL_TABLET | Freq: Four times a day (QID) | ORAL | Status: DC | PRN

## 2016-02-14 MED ORDER — PROPOFOL 10 MG/ML IV BOLUS
INTRAVENOUS | Status: DC | PRN
  Administered 2016-02-14: 200 mg via INTRAVENOUS

## 2016-02-14 MED ORDER — FENTANYL CITRATE (PF) 100 MCG/2ML IJ SOLN
25.0000 ug | INTRAMUSCULAR | Status: DC | PRN
  Administered 2016-02-14 (×3): 25 ug via INTRAVENOUS

## 2016-02-14 MED ORDER — FAMOTIDINE 20 MG PO TABS
ORAL_TABLET | ORAL | Status: AC
  Filled 2016-02-14: qty 1

## 2016-02-14 MED ORDER — MIDAZOLAM HCL 2 MG/2ML IJ SOLN
INTRAMUSCULAR | Status: DC | PRN
  Administered 2016-02-14: 2 mg via INTRAVENOUS

## 2016-02-14 MED ORDER — DEXAMETHASONE SODIUM PHOSPHATE 10 MG/ML IJ SOLN
INTRAMUSCULAR | Status: DC | PRN
  Administered 2016-02-14: 10 mg via INTRAVENOUS

## 2016-02-14 MED ORDER — LACTATED RINGERS IV SOLN
INTRAVENOUS | Status: DC
  Administered 2016-02-14: 14:00:00 via INTRAVENOUS

## 2016-02-14 MED ORDER — SULFAMETHOXAZOLE-TRIMETHOPRIM 800-160 MG PO TABS: 1.0000 | ORAL_TABLET | Freq: Two times a day (BID) | ORAL | Status: DC

## 2016-02-14 SURGICAL SUPPLY — 30 items
BACTOSHIELD CHG 4% 4OZ (MISCELLANEOUS) ×1
BASKET ZERO TIP 1.9FR (BASKET) ×2 IMPLANT
CATH URETL 5X70 OPEN END (CATHETERS) ×2 IMPLANT
CNTNR SPEC 2.5X3XGRAD LEK (MISCELLANEOUS) ×1
CONT SPEC 4OZ STER OR WHT (MISCELLANEOUS) ×1
CONTAINER SPEC 2.5X3XGRAD LEK (MISCELLANEOUS) ×1 IMPLANT
DRESSING TELFA 4X3 1S ST N-ADH (GAUZE/BANDAGES/DRESSINGS) ×2 IMPLANT
FEE TECHNICIAN ONLY PER HOUR (MISCELLANEOUS) IMPLANT
GLOVE BIO SURGEON STRL SZ7 (GLOVE) ×4 IMPLANT
GLOVE BIO SURGEON STRL SZ7.5 (GLOVE) ×2 IMPLANT
GOWN STRL REUS W/ TWL LRG LVL4 (GOWN DISPOSABLE) ×1 IMPLANT
GOWN STRL REUS W/TWL LRG LVL4 (GOWN DISPOSABLE) ×1
GOWN STRL REUS W/TWL XL LVL3 (GOWN DISPOSABLE) ×2 IMPLANT
GUIDEWIRE SUPER STIFF (WIRE) IMPLANT
KIT RM TURNOVER CYSTO AR (KITS) ×2 IMPLANT
LASER FIBER 200M SMARTSCOPE (Laser) ×2 IMPLANT
LASER HOLMIUM FIBER SU 272UM (MISCELLANEOUS) IMPLANT
PACK CYSTO AR (MISCELLANEOUS) ×2 IMPLANT
SCRUB CHG 4% DYNA-HEX 4OZ (MISCELLANEOUS) ×1 IMPLANT
SENSORWIRE 0.038 NOT ANGLED (WIRE) ×4
SET CYSTO W/LG BORE CLAMP LF (SET/KITS/TRAYS/PACK) ×2 IMPLANT
SHEATH URETERAL 13/15X36 1L (SHEATH) IMPLANT
SOL .9 NS 3000ML IRR  AL (IV SOLUTION) ×1
SOL .9 NS 3000ML IRR UROMATIC (IV SOLUTION) ×1 IMPLANT
STENT URET 6FRX24 CONTOUR (STENTS) IMPLANT
STENT URET 6FRX26 CONTOUR (STENTS) ×2 IMPLANT
SURGILUBE 2OZ TUBE FLIPTOP (MISCELLANEOUS) ×2 IMPLANT
SYRINGE IRR TOOMEY STRL 70CC (SYRINGE) ×2 IMPLANT
WATER STERILE IRR 1000ML POUR (IV SOLUTION) ×2 IMPLANT
WIRE SENSOR 0.038 NOT ANGLED (WIRE) ×2 IMPLANT

## 2016-02-14 NOTE — Transfer of Care (Signed)
Immediate Anesthesia Transfer of Care Note  Patient: Molly HusbandsBrandi N Miller  Procedure(s) Performed: Procedure(s): URETEROSCOPY WITH HOLMIUM LASER LITHOTRIPSY (Right) CYSTOSCOPY WITH STENT EXCHANGE (Right) HOLMIUM LASER APPLICATION (Right)  Patient Location: PACU  Anesthesia Type:General  Level of Consciousness: awake, alert , oriented and patient cooperative  Airway & Oxygen Therapy: Patient Spontanous Breathing and Patient connected to face mask oxygen  Post-op Assessment: Report given to RN, Post -op Vital signs reviewed and stable and Patient moving all extremities X 4  Post vital signs: Reviewed and stable  Last Vitals:  Filed Vitals:   02/14/16 1352  BP: 141/89  Pulse: 102  Temp: 36.7 C  Resp: 16    Complications: No apparent anesthesia complications

## 2016-02-14 NOTE — Anesthesia Postprocedure Evaluation (Signed)
Anesthesia Post Note  Patient: Molly HusbandsBrandi N Fantini  Procedure(s) Performed: Procedure(s) (LRB): URETEROSCOPY WITH HOLMIUM LASER LITHOTRIPSY (Right) CYSTOSCOPY WITH STENT EXCHANGE (Right) HOLMIUM LASER APPLICATION (Right)  Patient location during evaluation: PACU Anesthesia Type: General Level of consciousness: awake and alert Pain management: pain level controlled Vital Signs Assessment: post-procedure vital signs reviewed and stable Respiratory status: spontaneous breathing, nonlabored ventilation, respiratory function stable and patient connected to nasal cannula oxygen Cardiovascular status: blood pressure returned to baseline and stable Postop Assessment: no signs of nausea or vomiting Anesthetic complications: no    Last Vitals:  Filed Vitals:   02/14/16 1623 02/14/16 1631  BP: 145/92 144/82  Pulse: 96 88  Temp:  36.6 C  Resp: 16 14    Last Pain:  Filed Vitals:   02/14/16 1634  PainSc: 3                  [name]

## 2016-02-14 NOTE — Interval H&P Note (Signed)
History and Physical Interval Note:  02/14/2016 1:52 PM  Molly Miller  has presented today for surgery, with the diagnosis of right nephrolithiasis  The various methods of treatment have been discussed with the patient and family. After consideration of risks, benefits and other options for treatment, the patient has consented to  Procedure(s): URETEROSCOPY WITH HOLMIUM LASER LITHOTRIPSY (Right) CYSTOSCOPY WITH STENT EXCHANGE (Right) HOLMIUM LASER APPLICATION (Right) as a surgical intervention .  The patient's history has been reviewed, patient examined, no change in status, stable for surgery.  I have reviewed the patient's chart and labs.  Questions were answered to the patient's satisfaction.    RRR Unlabored resp  Hildred LaserBrian James Nathali Vent

## 2016-02-14 NOTE — Anesthesia Preprocedure Evaluation (Signed)
Anesthesia Evaluation  Patient identified by MRN, date of birth, ID band Patient awake    Reviewed: Allergy & Precautions, NPO status , Patient's Chart, lab work & pertinent test results, reviewed documented beta blocker date and time   History of Anesthesia Complications Negative for: history of anesthetic complications  Airway Mallampati: II  TM Distance: >3 FB     Dental  (+) Chipped, Teeth Intact   Pulmonary neg pulmonary ROS,           Cardiovascular negative cardio ROS       Neuro/Psych PSYCHIATRIC DISORDERS Depression negative neurological ROS     GI/Hepatic negative GI ROS, Neg liver ROS,   Endo/Other  negative endocrine ROS  Renal/GU Renal disease (kidney stones)  negative genitourinary   Musculoskeletal   Abdominal   Peds  Hematology negative hematology ROS (+)   Anesthesia Other Findings Obese.  Reproductive/Obstetrics negative OB ROS                             Anesthesia Physical  Anesthesia Plan  ASA: III  Anesthesia Plan: General   Post-op Pain Management:    Induction: Intravenous  Airway Management Planned: Oral ETT and LMA  Additional Equipment:   Intra-op Plan:   Post-operative Plan:   Informed Consent: I have reviewed the patients History and Physical, chart, labs and discussed the procedure including the risks, benefits and alternatives for the proposed anesthesia with the patient or authorized representative who has indicated his/her understanding and acceptance.     Plan Discussed with: CRNA  Anesthesia Plan Comments:         Anesthesia Quick Evaluation

## 2016-02-14 NOTE — H&P (View-Only) (Signed)
01/18/2016 9:46 AM   Molly Miller 01-Jul-1981 [id number]  Referring provider: [name], NP [address] [place], [place] [address]  Chief Complaint  Patient presents with  . New Patient (Initial Visit)    Renal stone,     HPI: The patient is a 35 year old female with a 13 mm right UPJ stone status post right ureteral stent in the setting of leukocytosis and pyuria. She presents today to discuss definitive stone management. She also has a few smaller stones on the right side that are nonobstructing in the renal pelvis and a distal ureteral fragment. Her urine culture did grow Escherichia coli sensitive to Bactrim. She also positive blood culture with Escherichia coli. She is on a course of Bactrim at this time.    PMH: Past Medical History  Diagnosis Date  . Acne   . Kidney stone     Surgical History: Past Surgical History  Procedure Laterality Date  . Cystoscopy with stent placement Right 01/14/2016    Procedure: RIGHT CYSTOSCOPY, RIGHT RETROGRADE, RIGHT URETERAL STENT PLACEMENT;  Surgeon: Crist Fat, MD;  Location: [facility] ORS;  Service: Urology;  Laterality: Right;    Home Medications:    Medication List       This list is accurate as of: 01/18/16  9:46 AM.  Always use your most recent med list.               ALPRAZolam 0.5 MG tablet  Commonly known as:  XANAX  Take 0.5 mg by mouth at bedtime as needed for anxiety.     CoQ10 100 MG Caps  Take 1 capsule by mouth daily at 6 (six) AM.     ibuprofen 200 MG tablet  Commonly known as:  ADVIL,MOTRIN  Take 600 mg by mouth every 4 (four) hours as needed.     phenazopyridine 200 MG tablet  Commonly known as:  PYRIDIUM  Take 1 tablet (200 mg total) by mouth 3 (three) times daily as needed for pain.     spironolactone 50 MG tablet  Commonly known as:  ALDACTONE  Take 50 mg by mouth daily.     sulfamethoxazole-trimethoprim 800-160 MG tablet  Commonly known as:  BACTRIM DS,SEPTRA DS  Take 1 tablet  by mouth 2 (two) times daily.     sulfamethoxazole-trimethoprim 800-160 MG tablet  Commonly known as:  BACTRIM DS,SEPTRA DS  Take 1 tablet by mouth every 12 (twelve) hours.     traMADol 50 MG tablet  Commonly known as:  ULTRAM  Take 1-2 tablets (50-100 mg total) by mouth every 6 (six) hours as needed for moderate pain.     Trospium Chloride 60 MG Cp24  Take 1 capsule (60 mg total) by mouth daily.     venlafaxine XR 75 MG 24 hr capsule  Commonly known as:  EFFEXOR-XR  Take 1 capsule by mouth  daily with breakfast     vitamin C 500 MG tablet  Commonly known as:  ASCORBIC ACID  Take 500 mg by mouth daily.        Allergies: No Known Allergies  Family History: Family History  Problem Relation Age of Onset  . Hypertension Father   . Arthritis Father   . Heart disease Father   . Stroke Mother   . Seizures Mother   . Dementia Mother   . Cancer Mother     Cervical?  . Nephrolithiasis Mother     Social History:  reports that she has never smoked.  She does not have any smokeless tobacco history on file. She reports that she drinks alcohol. She reports that she does not use illicit drugs.  ROS: UROLOGY Frequent Urination?: Yes Hard to postpone urination?: Yes Burning/pain with urination?: Yes Get up at night to urinate?: Yes Leakage of urine?: No Urine stream starts and stops?: No Trouble starting stream?: No Do you have to strain to urinate?: No Blood in urine?: Yes Urinary tract infection?: No Sexually transmitted disease?: No Injury to kidneys or bladder?: No Painful intercourse?: No Weak stream?: Yes Currently pregnant?: No Vaginal bleeding?: No Last menstrual period?: No  Gastrointestinal Nausea?: No Vomiting?: No Indigestion/heartburn?: No Diarrhea?: No Constipation?: Yes  Constitutional Fever: No Night sweats?: No Weight loss?: No Fatigue?: No  Skin Skin rash/lesions?: No Itching?: No  Eyes Blurred vision?: No Double vision?:  No  Ears/Nose/Throat Sore throat?: No Sinus problems?: No  Hematologic/Lymphatic Swollen glands?: No Easy bruising?: No  Cardiovascular Leg swelling?: No Chest pain?: No  Respiratory Cough?: No Shortness of breath?: No  Endocrine Excessive thirst?: No  Musculoskeletal Back pain?: No Joint pain?: No  Neurological Headaches?: No Dizziness?: No  Psychologic Depression?: Yes Anxiety?: Yes  Physical Exam: BP 114/77 mmHg  Pulse 89  Ht 5\' 9"  (1.753 m)  Wt 240 lb 8 oz (109.09 kg)  BMI 35.50 kg/m2  LMP 12/31/2015  Constitutional:  Alert and oriented, No acute distress. HEENT: [place] AT, moist mucus membranes.  Trachea midline, no masses. Cardiovascular: No clubbing, cyanosis, or edema. Respiratory: Normal respiratory effort, no increased work of breathing. GI: Abdomen is soft, nontender, nondistended, no abdominal masses GU: No CVA tenderness.  Skin: No rashes, bruises or suspicious lesions. Lymph: No cervical or inguinal adenopathy. Neurologic: Grossly intact, no focal deficits, moving all 4 extremities. Psychiatric: Normal mood and affect.  Laboratory Data: Lab Results  Component Value Date   WBC 38.2* 01/14/2016   HGB 13.4 01/14/2016   HCT 40.3 01/14/2016   MCV 85.3 01/14/2016   PLT 324 01/14/2016    Lab Results  Component Value Date   CREATININE 1.18* 01/14/2016    No results found for: PSA  No results found for: TESTOSTERONE  No results found for: HGBA1C  Urinalysis    Component Value Date/Time   COLORURINE AMBER* 01/14/2016 1620   APPEARANCEUR CLOUDY* 01/14/2016 1620   LABSPEC 1.030 01/14/2016 1620   PHURINE 5.0 01/14/2016 1620   GLUCOSEU NEGATIVE 01/14/2016 1620   HGBUR 1+* 01/14/2016 1620   BILIRUBINUR 1+* 01/14/2016 1620   BILIRUBINUR negative 10/11/2015 0906   KETONESUR 2+* 01/14/2016 1620   PROTEINUR >500* 01/14/2016 1620   PROTEINUR +- 10/11/2015 0906   UROBILINOGEN negative 10/11/2015 0906   NITRITE NEGATIVE 01/14/2016 1620    NITRITE positive 10/11/2015 0906   LEUKOCYTESUR 2+* 01/14/2016 1620    Pertinent Imaging: CLINICAL DATA: Low back and right-sided pain that started yesterday and radiates rounded the abdomen. Nausea.  EXAM: CT ABDOMEN AND PELVIS WITHOUT CONTRAST  TECHNIQUE: Multidetector CT imaging of the abdomen and pelvis was performed following the standard protocol without IV contrast.  COMPARISON: 11/27/2007  FINDINGS: Lower chest: Compressive atelectasis posterior right lung base.  Hepatobiliary: No focal abnormality in the liver on this study without intravenous contrast. No evidence of hepatomegaly. There is no evidence for gallstones, gallbladder wall thickening, or pericholecystic fluid. No intrahepatic or extrahepatic biliary dilation.  Pancreas: No focal mass lesion. No dilatation of the main duct. No intraparenchymal cyst. No peripancreatic edema.  Spleen: No splenomegaly. No focal mass lesion.  Adrenals/Urinary Tract:  No adrenal nodule or mass. Right kidney is enlarged and edematous with perinephric edema. There is mild right hydronephrosis. 13 x 8 x 13 mm stone is positioned at the right UPJ. Two additional stones are seen distal to the right UPJ, in the proximal right ureter. The more proximal of the 2 which is just below the UPJ measures 2 x 2 x 6 mm. The more distal of the 2 measures 2 x 4 x 7 mm. 5 x 5 x 7 mm stone is identified in the lower pole of the right kidney  1-2 mm nonobstructing stone is seen in the upper pole of the left kidney. No left ureteral stones no bladder stones.  Stomach/Bowel: Stomach is nondistended. No gastric wall thickening. No evidence of outlet obstruction. Duodenum is normally positioned as is the ligament of Treitz. No small bowel wall thickening. No small bowel dilatation. The terminal ileum is normal. The appendix is normal. 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  Vascular/Lymphatic: No abdominal aortic aneurysm. No abdominal aortic atherosclerotic calcification. There is no gastrohepatic or hepatoduodenal ligament lymphadenopathy. No intraperitoneal or retroperitoneal lymphadenopathy. No pelvic sidewall lymphadenopathy.  Reproductive: The uterus has normal CT imaging appearance. There is no adnexal mass.  Other: No intraperitoneal free fluid.  Musculoskeletal: Bone windows reveal no worrisome lytic or sclerotic osseous lesions.  IMPRESSION: 1. 13 x 8 x 13 mm stone at the right UPJ is associated with 2 additional smaller stones located in the proximal right ureter. Together, these stones generate mild to moderate right hydronephrosis with perinephric edema. 2. 5 x 5 x 7 mm right lower pole renal stone. 3. Tiny nonobstructing stone left kidney.    Assessment & Plan:   I discussed possible treatments options with the patient which include lithotripsy as well as right ureteroscopy for stone management. Given that showed he has ureteral stent and multiple smaller stones in the kidney she will be best served in my opinion with ureteroscopy as this will give her the best chance of being stone free with one operation. We discussed risks, benefits, indications of the surgery. She understands the risks include but are not limited to bleeding, infection, iatrogenic injury. All of her questions were answered and she elects to proceed  1. Right nephrolithiasis -Cystoscopy, right ureteroscopy, laser lithotripsy, stone basketing, right retrograde pyelogram, and right ureteral stent exchange -She will need a negative urine culture prior to proceeding. -Finish course of Bactrim  Return for surgery.  Hildred Laser, MD  [facility] [address] [place], [place] [address] [phone]

## 2016-02-14 NOTE — Op Note (Signed)
Date of procedure: 02/14/2016  Preoperative diagnosis:  1. Right nephrolithiasis   Postoperative diagnosis:  1. Right nephrolithiasis   Procedure: 1. Cystoscopy 2. Right ureteroscopy 3. Laser lithotripsy 4. Stone basketing 5. Right retrograde pyelogram with interpretation 6. Right ureteral stent exchange 6 [place] by 26 cm  Surgeon: Baruch Gouty, MD  Anesthesia: General  Complications: None  Intraoperative findings: The patient had a large 1.3 cm stone in the right renal pelvis. This was broken into small fragments with laser lithotripsy. It was very soft. The larger fragments greater than 3 mm were removed. There was no other significant residual stone burden noted. Right retrograde pyelogram date of the procedure showed no filling defects consistent with no residual stone.  EBL: None  Specimens: Right renal stone to pathology  Drains: 6 French by 26 cm right double-J ureteral stent  Disposition: Stable to the postanesthesia care unit  Indication for procedure: The patient is a 35 y.o. female with a 1.3 cm right UPJ stone as well as smaller stones in the renal pelvis and a stent placed in the setting of sepsis. She just today for definitive stone management..  After reviewing the management options for treatment, the patient elected to proceed with the above surgical procedure(s). We have discussed the potential benefits and risks of the procedure, side effects of the proposed treatment, the likelihood of the patient achieving the goals of the procedure, and any potential problems that might occur during the procedure or recuperation. Informed consent has been obtained.  Description of procedure: The patient was met in the preoperative area. All risks, benefits, and indications of the procedure were described in great detail. The patient consented to the procedure. Preoperative antibiotics were given. The patient was taken to the operative theater. General anesthesia was induced  per the anesthesia service. The patient was then placed in the dorsal lithotomy position and prepped and draped in the usual sterile fashion. A preoperative timeout was called.    A 21 French 30 cystoscope was inserted into the patient's bladder per urethra atraumatically. The right ureteral stent was then grasped flex the graspers and brought to level of the urethral meatus. A sensor wire was exchanged through the stent and the stent removed. The sensor was confirmed to be in the renal pelvis on fluoroscopy. Rigid pain ureteroscopy on the right side was unremarkable for stone burden. A second sensor wire was then placed under fluoroscopy. The semirigid scope was removed. A ureteral access sheath and placed over the sensor wires under fluoroscopy directly. The flexors was then passed in the right renal pelvis through the access sheath. Pan nephroscopy only revealed one large 1.37 m stone. This is broken into small fragments with laser lithotripsy. He is very soft. Larger fragments were sent for pathology. The remaining fragments were dusted with laser lithotripsy. There are no fragments larger than 3 mm noted. Pan nephroscopy showed another large fragments. A right retrograde polygrams obtained this time which also no filling defects With Large Stone Burden. The Ureteral Axis Sheath Was Looked out under Direct Visualization with No Evidence of Trauma to the right ureter. A 6 French by 26 cm double-J ureteral stent was then passed on the sensor wire through the cystoscope and sensor was removed. The right ureteral stent was confirmed to be in the 4 placed curls in the patient's renal pelvis on fluoroscopy to curl seen in the patient's urinary bladder direct. There also was clear drainage of urine the right ureteral stent. The patient's bladder was then  drained she was woken from anesthesia and transferred stable condition to postanesthesia care unit.  Plan: The patient will follow-up in one week for stent  removal. She'll need an ultrasound in one month.  Baruch Gouty, M.D.

## 2016-02-14 NOTE — Anesthesia Procedure Notes (Signed)
Procedure Name: Intubation Date/Time: 02/14/2016 2:56 PM Performed by: Michaele OfferSAVAGE, Lexus Shampine Pre-anesthesia Checklist: Patient identified, Emergency Drugs available, Suction available, Patient being monitored and Timeout performed Patient Re-evaluated:Patient Re-evaluated prior to inductionOxygen Delivery Method: Circle system utilized Preoxygenation: Pre-oxygenation with 100% oxygen Intubation Type: IV induction Ventilation: Mask ventilation without difficulty Laryngoscope Size: Mac and 3 Grade View: Grade I Tube type: Oral Tube size: 7.0 mm Number of attempts: 1 Airway Equipment and Method: Rigid stylet Placement Confirmation: ETT inserted through vocal cords under direct vision,  positive ETCO2 and breath sounds checked- equal and bilateral Secured at: 21 cm Tube secured with: Tape Dental Injury: Teeth and Oropharynx as per pre-operative assessment

## 2016-02-15 ENCOUNTER — Telehealth: Payer: Self-pay

## 2016-02-15 ENCOUNTER — Encounter: Payer: Self-pay | Admitting: Urology

## 2016-02-15 NOTE — Telephone Encounter (Signed)
-----   Message from Hildred LaserBrian James Budzyn, MD sent at 02/14/2016  3:37 PM EDT ----- Patient needs to see me late next week for cysto/stent removal

## 2016-02-19 ENCOUNTER — Telehealth: Payer: Self-pay

## 2016-02-19 DIAGNOSIS — N200 Calculus of kidney: Secondary | ICD-10-CM

## 2016-02-19 NOTE — Telephone Encounter (Signed)
LMOM- pt had called after hours triage line over the weekend stating upon urination stent fell out.

## 2016-02-19 NOTE — Telephone Encounter (Signed)
Pt states her ureteral stent came out upon urination over the weekend. She described it as being blue, curled on each end & estimates it to be about a foot long if stretched out.  Pt asks if she should cancel her cysto appt for 02/22/16? States if so she would like a phone call to discuss the surgery. Phone # is [phone]

## 2016-02-19 NOTE — Telephone Encounter (Signed)
Done

## 2016-02-19 NOTE — Telephone Encounter (Signed)
Spoke with patient regarding her surgery. Stent came out spontaneously. We can cancel her cysto appt on 02-22-16. She needs to have a new appt for one month with a renal u/s prior. Can you please arrange this? Thanks,

## 2016-02-20 ENCOUNTER — Other Ambulatory Visit: Payer: Self-pay

## 2016-02-20 DIAGNOSIS — N200 Calculus of kidney: Secondary | ICD-10-CM

## 2016-02-21 LAB — STONE ANALYSIS
Ca Oxalate,Monohydr.: 10 %
Ca phos cry stone ql IR: 90 %
Stone Weight KSTONE: 14 mg

## 2016-02-22 ENCOUNTER — Other Ambulatory Visit: Payer: 59

## 2016-03-18 ENCOUNTER — Ambulatory Visit
Admission: RE | Admit: 2016-03-18 | Discharge: 2016-03-18 | Disposition: A | Discharge status: Home / Self Care | Payer: 59 | Source: Ambulatory Visit | Attending: Urology | Admitting: Urology

## 2016-03-18 DIAGNOSIS — N200 Calculus of kidney: Secondary | ICD-10-CM | POA: Insufficient documentation

## 2016-03-21 ENCOUNTER — Ambulatory Visit (INDEPENDENT_AMBULATORY_CARE_PROVIDER_SITE_OTHER): Payer: 59 | Admitting: Urology

## 2016-03-21 ENCOUNTER — Encounter: Payer: Self-pay | Admitting: Urology

## 2016-03-21 VITALS — BP 120/82 | HR 85 | Ht 69.0 in | Wt 248.9 lb

## 2016-03-21 DIAGNOSIS — N200 Calculus of kidney: Secondary | ICD-10-CM

## 2016-03-21 NOTE — Progress Notes (Signed)
03/21/2016 9:43 AM   Molly Miller 11/29/1980 [id number]  Referring provider: Doreene NestKatherine K Clark, NP [address] [place], [place] [id number]  Chief Complaint  Patient presents with  . Routine Post Op    URETEROSCOPY WITH HOLMIUM LASER, diagnose study US     HPI: The patient is a 35 year old female with a 13 mm right UPJ stone status post right ureteral stent in the setting of leukocytosis and pyuria. After clearing her for urinary tract infection, she underwent a right ureteroscopy with eventual stent removal. She was follows up today with renal ultrasound results. There is a possible small 4-5 mm fragment in her right lower pole. Her ultrasound is otherwise normal. Her stone analysis came back as 90% calcium phosphate and 10% calcium oxalate monohydrate   PMH: Past Medical History  Diagnosis Date  . Acne   . Kidney stone   . Depression   . Anxiety     Surgical History: Past Surgical History  Procedure Laterality Date  . Cystoscopy with stent placement Right 01/14/2016    Procedure: RIGHT CYSTOSCOPY, RIGHT RETROGRADE, RIGHT URETERAL STENT PLACEMENT;  Surgeon: Crist FatBenjamin W Herrick, MD;  Location: [facility];  Service: Urology;  Laterality: Right;  . Ureteroscopy with holmium laser lithotripsy Right 02/14/2016    Procedure: URETEROSCOPY WITH HOLMIUM LASER LITHOTRIPSY;  Surgeon: Hildred LaserBrian James Ariston Grandison, MD;  Location: [facility];  Service: Urology;  Laterality: Right;  . Cystoscopy w/ ureteral stent placement Right 02/14/2016    Procedure: CYSTOSCOPY WITH STENT EXCHANGE;  Surgeon: Hildred LaserBrian James Joelys Staubs, MD;  Location: [facility];  Service: Urology;  Laterality: Right;  . Holmium laser application Right 02/14/2016    Procedure: HOLMIUM LASER APPLICATION;  Surgeon: Hildred LaserBrian James Alanya Vukelich, MD;  Location: [facility];  Service: Urology;  Laterality: Right;    Home Medications:    Medication List       This list is accurate as of: 03/21/16  9:43 AM.  Always use your most recent med list.               ALPRAZolam 0.5 MG tablet  Commonly known as:  XANAX  Take 0.5 mg by mouth at bedtime as needed for anxiety.     BIOTIN MAXIMUM STRENGTH 10 MG Tabs  Generic drug:  Biotin  Take 1 tablet by mouth. Reported on 03/21/2016     CoQ10 100 MG Caps  Take 1 capsule by mouth daily at 6 (six) AM. Reported on 03/21/2016     spironolactone 50 MG tablet  Commonly known as:  ALDACTONE  Take 50 mg by mouth daily. Reported on 03/21/2016     venlafaxine XR 75 MG 24 hr capsule  Commonly known as:  EFFEXOR-XR  Take 1 capsule by mouth  daily with breakfast     vitamin C 500 MG tablet  Commonly known as:  ASCORBIC ACID  Take 500 mg by mouth daily. Reported on 03/21/2016        Allergies:  Allergies  Allergen Reactions  . Tramadol Nausea Only    Family History: Family History  Problem Relation Age of Onset  . Hypertension Father   . Arthritis Father   . Heart disease Father   . Stroke Mother   . Seizures Mother   . Dementia Mother   . Cancer Mother     Cervical?  . Nephrolithiasis Mother   . Hematuria Mother     Social History:  reports that she has never smoked. She has never used smokeless tobacco. She reports  that she drinks alcohol. She reports that she does not use illicit drugs.  ROS: UROLOGY Frequent Urination?: No Hard to postpone urination?: No Burning/pain with urination?: No Get up at night to urinate?: No Leakage of urine?: Yes Urine stream starts and stops?: No Trouble starting stream?: No Do you have to strain to urinate?: No Blood in urine?: No Urinary tract infection?: No Sexually transmitted disease?: No Injury to kidneys or bladder?: No Painful intercourse?: No Weak stream?: No Currently pregnant?: No Vaginal bleeding?: No Last menstrual period?: n  Gastrointestinal Nausea?: No Vomiting?: No Indigestion/heartburn?: No Diarrhea?: No Constipation?: No  Constitutional Fever: No Night sweats?: No Weight loss?: No Fatigue?: No  Skin Skin  rash/lesions?: No Itching?: No  Eyes Blurred vision?: No Double vision?: No  Ears/Nose/Throat Sore throat?: No Sinus problems?: No  Hematologic/Lymphatic Swollen glands?: No Easy bruising?: No  Cardiovascular Leg swelling?: No Chest pain?: No  Respiratory Cough?: No Shortness of breath?: No  Endocrine Excessive thirst?: No  Musculoskeletal Back pain?: No Joint pain?: No  Neurological Headaches?: No Dizziness?: No  Psychologic Depression?: Yes Anxiety?: Yes  Physical Exam: BP 120/82 mmHg  Pulse 85  Ht 5\' 9"  (1.753 m)  Wt 248 lb 14.4 oz (112.9 kg)  BMI 36.74 kg/m2  Constitutional:  Alert and oriented, No acute distress. HEENT: [place] AT, moist mucus membranes.  Trachea midline, no masses. Cardiovascular: No clubbing, cyanosis, or edema. Respiratory: Normal respiratory effort, no increased work of breathing. GI: Abdomen is soft, nontender, nondistended, no abdominal masses GU: No CVA tenderness.  Skin: No rashes, bruises or suspicious lesions. Lymph: No cervical or inguinal adenopathy. Neurologic: Grossly intact, no focal deficits, moving all 4 extremities. Psychiatric: Normal mood and affect.  Laboratory Data: Lab Results  Component Value Date   WBC 8.4 01/30/2016   HGB 12.8 01/30/2016   HCT 38.5 01/30/2016   MCV 84.1 01/30/2016   PLT 279 01/30/2016    Lab Results  Component Value Date   CREATININE 1.09* 01/30/2016    No results found for: PSA  No results found for: TESTOSTERONE  No results found for: HGBA1C  Urinalysis    Component Value Date/Time   COLORURINE AMBER* 01/14/2016 1620   APPEARANCEUR CLOUDY* 01/14/2016 1620   LABSPEC 1.030 01/14/2016 1620   PHURINE 5.0 01/14/2016 1620   GLUCOSEU NEGATIVE 01/14/2016 1620   HGBUR 1+* 01/14/2016 1620   BILIRUBINUR 1+* 01/14/2016 1620   BILIRUBINUR negative 10/11/2015 0906   KETONESUR 2+* 01/14/2016 1620   PROTEINUR >500* 01/14/2016 1620   PROTEINUR +- 10/11/2015 0906   UROBILINOGEN  negative 10/11/2015 0906   NITRITE NEGATIVE 01/14/2016 1620   NITRITE positive 10/11/2015 0906   LEUKOCYTESUR 2+* 01/14/2016 1620    Pertinent Imaging: CLINICAL DATA: Kidney stones  EXAM: RENAL / URINARY TRACT ULTRASOUND COMPLETE  COMPARISON: CT abdomen pelvis dated 01/14/2016  FINDINGS: Right Kidney:  Length: 11.4 cm. 5 mm nonobstructing lower pole calculus. No hydronephrosis.  Left Kidney:  Length: 11.9 cm. No mass or hydronephrosis.  Bladder:  Within normal limits. Bilateral bladder jets are visualized.  Additional comments: 5.8 x 6.0 x 5.6 cm simple left ovarian cyst, physiologic.  IMPRESSION: 5 mm nonobstructing right lower pole renal calculus. No hydronephrosis.  Assessment & Plan:    1. Right renal stone It appears that most of the patient's stone burden is now gone. She may have a small residual fragment in her right lower pole based on the ultrasound. I discussed the patient that on direct visualization that I saw no fragments larger  than 3 mm. We also discussed an ultrasound sometimes overestimate the stones presents. To be safe, we will get a KUB in 1 year. She'll contact us if she has symptoms prior. We also were stone analysis. She was given the ABCs of recurrent stone formation. This was her first set of stones.   Return in about 1 year (around 03/21/2017) for KUB prior.  Hildred Laser, MD  [facility] [address], [address] [place], [place] [address] [phone]

## 2016-04-19 LAB — URINALYSIS, COMPLETE
BILIRUBIN UA: NEGATIVE
Glucose, UA: NEGATIVE
KETONES UA: NEGATIVE
NITRITE UA: NEGATIVE
Protein, UA: NEGATIVE
SPEC GRAV UA: 1.02 (ref 1.005–1.030)
UUROB: 0.2 mg/dL (ref 0.2–1.0)
pH, UA: 7 (ref 5.0–7.5)

## 2016-04-19 LAB — MICROSCOPIC EXAMINATION

## 2016-04-29 ENCOUNTER — Ambulatory Visit (INDEPENDENT_AMBULATORY_CARE_PROVIDER_SITE_OTHER): Payer: 59 | Admitting: Primary Care

## 2016-04-29 ENCOUNTER — Encounter: Payer: Self-pay | Admitting: Primary Care

## 2016-04-29 VITALS — BP 122/78 | HR 85 | Temp 98.4°F | Ht 69.0 in | Wt 260.4 lb

## 2016-04-29 DIAGNOSIS — R600 Localized edema: Secondary | ICD-10-CM | POA: Diagnosis not present

## 2016-04-29 NOTE — Progress Notes (Signed)
Subjective:    Patient ID: Molly Miller, female    DOB: 11/22/1980, 35 y.o.   MRN: [id number]  HPI  Molly Miller is a 35 year old female who presents today with a chief complaint of extremity edema. She is currently managed on Spironalactone 150 mg at bedtime per her dermatologist for facial acne.   She feels like she's holding fluid. She has a history of fluid retention in the past when she weighed 300+ pounds with reduction in swelling after she lost weight. Over the past 1 month she's noticed swelling to her feet and lower extremities. She's been taking some lasix of her father's prescription with improvement. Her swelling is worse with consumption of salty foods. She has gained 20 pounds since April 2017 as she has not been exercising or eating as healthy as she should. She has since had multiple encounters for kidney stones and has since recovered. Denies chest pain, exertion with rest, headaches.   Wt Readings from Last 3 Encounters:  04/29/16 260 lb 6.4 oz (118.117 kg)  03/21/16 248 lb 14.4 oz (112.9 kg)  02/14/16 240 lb (108.863 kg)     Review of Systems  Constitutional: Negative for unexpected weight change.  Respiratory: Negative for shortness of breath.   Cardiovascular: Positive for leg swelling. Negative for chest pain and palpitations.  Neurological: Negative for dizziness and headaches.       Past Medical History  Diagnosis Date  . Acne   . Kidney stone   . Depression   . Anxiety      Social History   Social History  . Marital Status: Divorced    Spouse Name: N/A  . Number of Children: N/A  . Years of Education: N/A   Occupational History  . Not on file.   Social History Main Topics  . Smoking status: Never Smoker   . Smokeless tobacco: Never Used  . Alcohol Use: 0.0 oz/week    0 Standard drinks or equivalent per week     Comment: social  . Drug Use: No  . Sexual Activity: Not on file   Other Topics Concern  . Not on file   Social History  Narrative   Single   Works at [employer]   Enjoys shopping and antique and [employer] stores, gardening.          Past Surgical History  Procedure Laterality Date  . Cystoscopy with stent placement Right 01/14/2016    Procedure: RIGHT CYSTOSCOPY, RIGHT RETROGRADE, RIGHT URETERAL STENT PLACEMENT;  Surgeon: Crist FatBenjamin W Herrick, MD;  Location: [facility];  Service: Urology;  Laterality: Right;  . Ureteroscopy with holmium laser lithotripsy Right 02/14/2016    Procedure: URETEROSCOPY WITH HOLMIUM LASER LITHOTRIPSY;  Surgeon: Hildred LaserBrian James Budzyn, MD;  Location: [facility];  Service: Urology;  Laterality: Right;  . Cystoscopy w/ ureteral stent placement Right 02/14/2016    Procedure: CYSTOSCOPY WITH STENT EXCHANGE;  Surgeon: Hildred LaserBrian James Budzyn, MD;  Location: [facility];  Service: Urology;  Laterality: Right;  . Holmium laser application Right 02/14/2016    Procedure: HOLMIUM LASER APPLICATION;  Surgeon: Hildred LaserBrian James Budzyn, MD;  Location: [facility];  Service: Urology;  Laterality: Right;    Family History  Problem Relation Age of Onset  . Hypertension Father   . Arthritis Father   . Heart disease Father   . Stroke Mother   . Seizures Mother   . Dementia Mother   . Cancer Mother     Cervical?  . Nephrolithiasis Mother   .  Hematuria Mother     Allergies  Allergen Reactions  . Tramadol Nausea Only    Current Outpatient Prescriptions on File Prior to Visit  Medication Sig Dispense Refill  . ALPRAZolam (XANAX) 0.5 MG tablet Take 0.5 mg by mouth at bedtime as needed for anxiety.    . Coenzyme Q10 (COQ10) 100 MG CAPS Take 1 capsule by mouth daily at 6 (six) AM. Reported on 03/21/2016    . spironolactone (ALDACTONE) 50 MG tablet Take 50 mg by mouth daily. Reported on 03/21/2016    . venlafaxine XR (EFFEXOR-XR) 75 MG 24 hr capsule Take 1 capsule by mouth  daily with breakfast 90 capsule 2   No current facility-administered medications on file prior to visit.    BP 122/78 mmHg  Pulse 85  Temp(Src)  98.4 F (36.9 C) (Oral)  Ht 5\' 9"  (1.753 m)  Wt 260 lb 6.4 oz (118.117 kg)  BMI 38.44 kg/m2  SpO2 97%  LMP 03/17/2016 (Within Days)    Objective:   Physical Exam  Constitutional: She appears well-nourished.  Neck: Neck supple.  Cardiovascular: Normal rate and regular rhythm.   No murmur heard. No lower extremity edema noted today. 2+ DP and PT pulses.  Pulmonary/Chest: Effort normal and breath sounds normal.  Skin: Skin is warm and dry.          Assessment & Plan:

## 2016-04-29 NOTE — Progress Notes (Signed)
Pre visit review using our clinic review tool, if applicable. No additional management support is needed unless otherwise documented below in the visit note. 

## 2016-04-29 NOTE — Patient Instructions (Signed)
I believe the fluid retention is related to weight gain as discussed.  Complete lab work prior to leaving today.  I will be in touch with you soon.  It was a pleasure to see you today!  Peripheral Edema You have swelling in your legs (peripheral edema). This swelling is due to excess accumulation of salt and water in your body. Edema may be a sign of heart, kidney or liver disease, or a side effect of a medication. It may also be due to problems in the leg veins. Elevating your legs and using special support stockings may be very helpful, if the cause of the swelling is due to poor venous circulation. Avoid long periods of standing, whatever the cause. Treatment of edema depends on identifying the cause. Chips, pretzels, pickles and other salty foods should be avoided. Restricting salt in your diet is almost always needed. Water pills (diuretics) are often used to remove the excess salt and water from your body via urine. These medicines prevent the kidney from reabsorbing sodium. This increases urine flow. Diuretic treatment may also result in lowering of potassium levels in your body. Potassium supplements may be needed if you have to use diuretics daily. Daily weights can help you keep track of your progress in clearing your edema. You should call your caregiver for follow up care as recommended. SEEK IMMEDIATE MEDICAL CARE IF:   You have increased swelling, pain, redness, or heat in your legs.  You develop shortness of breath, especially when lying down.  You develop chest or abdominal pain, weakness, or fainting.  You have a fever.   This information is not intended to replace advice given to you by your health care provider. Make sure you discuss any questions you have with your health care provider.   Document Released: 11/28/2004 Document Revised: 01/13/2012 Document Reviewed: 05/03/2015 Elsevier Interactive Patient Education [employer]2016 Elsevier Inc.

## 2016-04-29 NOTE — Assessment & Plan Note (Signed)
Her history of this in the past when she weighed 300+ pounds. Weight gain of 20 pounds since April 2017 and has undergone surgery for multiple kidney stones. Suspect lower extremity edema due to venous insufficiency secondary to weight gain as she has no indications of CHF and BMP is pending.  Discussed importance of weight loss to prevent and reduce future swelling. Also discussed elevation of extremities and to avoid dependent positions for a prolonged period of time. Am reticent to provide her with any diuretic as she is already managed on spironolactone per dermatology.

## 2016-04-30 ENCOUNTER — Other Ambulatory Visit: Payer: Self-pay | Admitting: Primary Care

## 2016-04-30 DIAGNOSIS — R600 Localized edema: Secondary | ICD-10-CM

## 2016-04-30 LAB — BASIC METABOLIC PANEL
BUN / CREAT RATIO: 19 (ref 9–23)
BUN: 16 mg/dL (ref 6–20)
CO2: 24 mmol/L (ref 18–29)
CREATININE: 0.83 mg/dL (ref 0.57–1.00)
Calcium: 9.2 mg/dL (ref 8.7–10.2)
Chloride: 99 mmol/L (ref 96–106)
GFR, EST AFRICAN AMERICAN: 106 mL/min/{1.73_m2} (ref >59)
GFR, EST NON AFRICAN AMERICAN: 92 mL/min/{1.73_m2} (ref >59)
GLUCOSE: 86 mg/dL (ref 65–99)
Potassium: 4.6 mmol/L (ref 3.5–5.2)
SODIUM: 136 mmol/L (ref 134–144)

## 2016-04-30 MED ORDER — FUROSEMIDE 20 MG PO TABS: ORAL_TABLET | ORAL | Status: DC

## 2016-05-20 ENCOUNTER — Other Ambulatory Visit: Payer: Self-pay | Admitting: Primary Care

## 2016-05-20 DIAGNOSIS — R600 Localized edema: Secondary | ICD-10-CM

## 2016-05-20 NOTE — Telephone Encounter (Signed)
Electronically refill request for   furosemide (LASIX) 20 MG tablet   Take 1 tablet by mouth once daily as needed for lower extremity swelling.  Dispense: 15 tablet   Refills: 0     Last prescribed on and seen 04/30/2016.

## 2016-05-21 NOTE — Telephone Encounter (Signed)
Sent patient a Wellsite geologistMyChart message of Kate's comments.

## 2016-06-10 ENCOUNTER — Other Ambulatory Visit: Payer: Self-pay | Admitting: Primary Care

## 2016-06-10 ENCOUNTER — Encounter: Payer: Self-pay | Admitting: Primary Care

## 2016-06-10 DIAGNOSIS — R600 Localized edema: Secondary | ICD-10-CM

## 2016-06-10 NOTE — Telephone Encounter (Signed)
Ok to refill? Electronically refill request for   furosemide (LASIX) 20 MG tablet TAKE 1 TABLET BY MOUTH ONCE DAILY AS NEEDED FOR LOWER EXTREMITY SWELLING.  Last prescribed on 05/20/2016. Last seen 04/30/2016. No future appt.

## 2016-06-13 ENCOUNTER — Encounter: Payer: Self-pay | Admitting: Primary Care

## 2016-06-13 ENCOUNTER — Ambulatory Visit (INDEPENDENT_AMBULATORY_CARE_PROVIDER_SITE_OTHER): Payer: 59 | Admitting: Primary Care

## 2016-06-13 VITALS — BP 126/84 | HR 83 | Temp 97.5°F | Ht 69.0 in | Wt 266.4 lb

## 2016-06-13 DIAGNOSIS — R600 Localized edema: Secondary | ICD-10-CM

## 2016-06-13 DIAGNOSIS — R631 Polydipsia: Secondary | ICD-10-CM | POA: Diagnosis not present

## 2016-06-13 DIAGNOSIS — E669 Obesity, unspecified: Secondary | ICD-10-CM

## 2016-06-13 LAB — POC URINALSYSI DIPSTICK (AUTOMATED)
Bilirubin, UA: NEGATIVE
Glucose, UA: NEGATIVE
KETONES UA: NEGATIVE
Leukocytes, UA: NEGATIVE
Nitrite, UA: NEGATIVE
PH UA: 6
PROTEIN UA: NEGATIVE
RBC UA: NEGATIVE
UROBILINOGEN UA: NEGATIVE

## 2016-06-13 NOTE — Progress Notes (Signed)
Subjective:    Patient ID: Molly Miller, female    DOB: 02/26/1981, 35 y.o.   MRN: [id number]  HPI  Ms. Khurana is a 35 year old female who presents today with multiple complaints.   1) Lower back pain: History of numerous kidney stones requiring surgery and Urology consult. Her pain began Sunday and is located to the right lower side. She describes her discomfort as a pressure. She did complete yard work on Saturday afternoon, but is nervous given her history of kidney stones. Denies dysuria, hematuria, nausea, fevers.  2) Lower extremity edema:  Managed on Spironolactone 150 mg HS per dermatologist for facial acne. Last visit she reported feeling as though she was holding fluid in her body. She had gained 20 pounds since April 2017 as she has not been as active given multiple surgeries. She took some of her fathers Lasix with improvement. She was provided with a temporary prescription of Lasix with improvement.   Since her last visit she's continued to experience fluid retention. She is taking 20 mg of Lasix as needed sparingly. She's not taken any lasix this week which has caused a lot of swelling to her lower legs and feet. Denies chest pain, SOB upon exertion. She is drinking about 150 ounces of water daily as she is trying to curb her appetite for weight loss. She does mention that she has been more thirsty.  3) Weight gain: History of obesity once weighing 300 pounds. She's gained 26 pounds since April 2017 and 6 pounds since her last visit in June. She does drink a lot of water as she is thirsty.   Wt Readings from Last 3 Encounters:  06/13/16 266 lb 6.4 oz (120.8 kg)  04/29/16 260 lb 6.4 oz (118.1 kg)  03/21/16 248 lb 14.4 oz (112.9 kg)    Her current diet consists of: Breakfast: Protein bars, skips Lunch: Salads with creamy and vinaigrette dressing, occasional pizza Dinner: Chicken, vegetables, brown rice, limiting bread Snacks: Baked chips, vegetables Desserts:  None Beverages: Water ( 5 cups of 30 ounces daily).  Exercise: She does not currently exercise.  Review of Systems  Constitutional: Negative for fatigue and fever.  Respiratory:       Shortness of breath when walking up several flights of stairs  Cardiovascular: Positive for leg swelling. Negative for chest pain and palpitations.  Genitourinary: Negative for dysuria, flank pain, frequency, hematuria and urgency.  Musculoskeletal: Positive for back pain.       Past Medical History:  Diagnosis Date  . Acne   . Anxiety   . Depression   . Kidney stone      Social History   Social History  . Marital status: Divorced    Spouse name: N/A  . Number of children: N/A  . Years of education: N/A   Occupational History  . Not on file.   Social History Main Topics  . Smoking status: Never Smoker  . Smokeless tobacco: Never Used  . Alcohol use 0.0 oz/week     Comment: social  . Drug use: No  . Sexual activity: Not on file   Other Topics Concern  . 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          Past Surgical History:  Procedure Laterality Date  . CYSTOSCOPY W/ URETERAL STENT PLACEMENT Right 02/14/2016   Procedure: CYSTOSCOPY WITH STENT EXCHANGE;  Surgeon: Hildred LaserBrian James Budzyn, MD;  Location: [facility];  Service: Urology;  Laterality: Right;  . CYSTOSCOPY WITH STENT PLACEMENT Right 01/14/2016   Procedure: RIGHT CYSTOSCOPY, RIGHT RETROGRADE, RIGHT URETERAL STENT PLACEMENT;  Surgeon: Crist FatBenjamin W Herrick, MD;  Location: [facility];  Service: Urology;  Laterality: Right;  . HOLMIUM LASER APPLICATION Right 02/14/2016   Procedure: HOLMIUM LASER APPLICATION;  Surgeon: Hildred LaserBrian James Budzyn, MD;  Location: [facility];  Service: Urology;  Laterality: Right;  . URETEROSCOPY WITH HOLMIUM LASER LITHOTRIPSY Right 02/14/2016   Procedure: URETEROSCOPY WITH HOLMIUM LASER LITHOTRIPSY;  Surgeon: Hildred LaserBrian James  Budzyn, MD;  Location: [facility];  Service: Urology;  Laterality: Right;    Family History  Problem Relation Age of Onset  . Hypertension Father   . Arthritis Father   . Heart disease Father   . Stroke Mother   . Seizures Mother   . Dementia Mother   . Cancer Mother     Cervical?  . Nephrolithiasis Mother   . Hematuria Mother     Allergies  Allergen Reactions  . Tramadol Nausea Only    Current Outpatient Prescriptions on File Prior to Visit  Medication Sig Dispense Refill  . ALPRAZolam (XANAX) 0.5 MG tablet Take 0.5 mg by mouth at bedtime as needed for anxiety.    . Coenzyme Q10 (COQ10) 100 MG CAPS Take 1 capsule by mouth daily at 6 (six) AM. Reported on 03/21/2016    . furosemide (LASIX) 20 MG tablet TAKE 1 TABLET BY MOUTH ONCE DAILY AS NEEDED FOR LOWER EXTREMITY SWELLING. 15 tablet 0  . spironolactone (ALDACTONE) 50 MG tablet Take 50 mg by mouth daily. Reported on 03/21/2016    . venlafaxine XR (EFFEXOR-XR) 75 MG 24 hr capsule Take 1 capsule by mouth  daily with breakfast 90 capsule 2   No current facility-administered medications on file prior to visit.     BP 126/84   Pulse 83   Temp 97.5 F (36.4 C) (Oral)   Ht 5\' 9"  (1.753 m)   Wt 266 lb 6.4 oz (120.8 kg)   SpO2 98%   BMI 39.34 kg/m    Objective:   Physical Exam  Constitutional: She appears well-nourished.  Neck: Neck supple.  Cardiovascular: Normal rate and regular rhythm.   No murmur heard. Mild lower extremity edema to feet and ankles bilaterally. Trace pitting.  Pulmonary/Chest: Effort normal and breath sounds normal.  Abdominal: Soft. There is no CVA tenderness.  Skin: Skin is warm and dry.          Assessment & Plan:  Low back pain:  Located to right lower back since Sunday. Worked out in the yard Saturday afternoon. She is nervous given her history of kidney stones and urinary tract infections. No other urinary symptoms present. Overall she's noticed improvement. Exam unremarkable. UA:  Negative leuks, nitrates, blood, protein. Suspect musculoskeletal related.  Morrie Sheldonlark,Christianne Zacher Kendal, NP

## 2016-06-13 NOTE — Progress Notes (Signed)
Pre visit review using our clinic review tool, if applicable. No additional management support is needed unless otherwise documented below in the visit note. 

## 2016-06-13 NOTE — Assessment & Plan Note (Signed)
Continues with temporary improvement of Lasix. Without Lasix will experience edema. Unsure of etiology at this time. UA today normal without the presence of protein, leuks, blood, nitrites. No murmur noted upon examination, low suspicion for CHF. Could be contributed to excessive amount of water intake. We'll check A1c today. Renal function in June unremarkable. Could also be from weight gain due to sedentary life, but am concerned that weight gain likely also contributed to fluid retention.  Discussed importance of low-salt diet, calorie tracking, reduction in water intake, elevation of extremities. Will treat with 40 mg of Lasix daily for 3 days, then reduce to 20 mg once daily for 1 week. She will email and update in 1 week.

## 2016-06-13 NOTE — Assessment & Plan Note (Signed)
Weight gain of 18 pounds since May 2017. She is not exercising or watching calories as she was once doing. Has experienced lower extremity edema which could likely be contributing to weight gain. No symptoms suggestive of heart failure at this time. Renal function in June stable. UA today without evidence of protein. Highly encouraged she start calorie counting as she was successful on this in the past. Will continue to monitor.

## 2016-06-13 NOTE — Patient Instructions (Signed)
Your urine test was negative.  Complete lab work prior to leaving today. I will notify you of your results once received.   Increase lasix to 40 mg daily for the next 3 days, then reduce down to 20 mg once daily for 1 week.   Cut down on consumption of water. 64 ounces of water daily is required.   E-mail me in a few days with an update on your swelling.  It was a pleasure to see you today!  Peripheral Edema You have swelling in your legs (peripheral edema). This swelling is due to excess accumulation of salt and water in your body. Edema may be a sign of heart, kidney or liver disease, or a side effect of a medication. It may also be due to problems in the leg veins. Elevating your legs and using special support stockings may be very helpful, if the cause of the swelling is due to poor venous circulation. Avoid long periods of standing, whatever the cause. Treatment of edema depends on identifying the cause. Chips, pretzels, pickles and other salty foods should be avoided. Restricting salt in your diet is almost always needed. Water pills (diuretics) are often used to remove the excess salt and water from your body via urine. These medicines prevent the kidney from reabsorbing sodium. This increases urine flow. Diuretic treatment may also result in lowering of potassium levels in your body. Potassium supplements may be needed if you have to use diuretics daily. Daily weights can help you keep track of your progress in clearing your edema. You should call your caregiver for follow up care as recommended. SEEK IMMEDIATE MEDICAL CARE IF:   You have increased swelling, pain, redness, or heat in your legs.  You develop shortness of breath, especially when lying down.  You develop chest or abdominal pain, weakness, or fainting.  You have a fever.   This information is not intended to replace advice given to you by your health care provider. Make sure you discuss any questions you have with your  health care provider.   Document Released: 11/28/2004 Document Revised: 01/13/2012 Document Reviewed: 05/03/2015 Elsevier Interactive Patient Education [employer]2016 Elsevier Inc.

## 2016-06-14 LAB — COMPREHENSIVE METABOLIC PANEL
ALBUMIN: 3.9 g/dL (ref 3.5–5.5)
ALT: 33 IU/L — AB (ref 0–32)
AST: 30 IU/L (ref 0–40)
Albumin/Globulin Ratio: 1.5 (ref 1.2–2.2)
Alkaline Phosphatase: 83 IU/L (ref 39–117)
BILIRUBIN TOTAL: 0.4 mg/dL (ref 0.0–1.2)
BUN/Creatinine Ratio: 19 (ref 9–23)
BUN: 13 mg/dL (ref 6–20)
CHLORIDE: 103 mmol/L (ref 96–106)
CO2: 24 mmol/L (ref 18–29)
CREATININE: 0.67 mg/dL (ref 0.57–1.00)
Calcium: 9 mg/dL (ref 8.7–10.2)
GFR calc non Af Amer: 114 mL/min/{1.73_m2} (ref >59)
GFR, EST AFRICAN AMERICAN: 132 mL/min/{1.73_m2} (ref >59)
Globulin, Total: 2.6 g/dL (ref 1.5–4.5)
Glucose: 88 mg/dL (ref 65–99)
Potassium: 4.5 mmol/L (ref 3.5–5.2)
Sodium: 140 mmol/L (ref 134–144)
TOTAL PROTEIN: 6.5 g/dL (ref 6.0–8.5)

## 2016-06-14 LAB — HEMOGLOBIN A1C
Est. average glucose Bld gHb Est-mCnc: 114 mg/dL
Hgb A1c MFr Bld: 5.6 % (ref 4.8–5.6)

## 2016-06-14 LAB — TSH: TSH: 1.44 u[IU]/mL (ref 0.450–4.500)

## 2016-06-21 ENCOUNTER — Encounter: Payer: Self-pay | Admitting: Primary Care

## 2016-07-01 ENCOUNTER — Encounter: Payer: Self-pay | Admitting: Primary Care

## 2016-07-01 ENCOUNTER — Other Ambulatory Visit: Payer: Self-pay | Admitting: Primary Care

## 2016-07-01 DIAGNOSIS — F419 Anxiety disorder, unspecified: Principal | ICD-10-CM

## 2016-07-01 DIAGNOSIS — F32A Depression, unspecified: Secondary | ICD-10-CM

## 2016-07-01 DIAGNOSIS — F329 Major depressive disorder, single episode, unspecified: Secondary | ICD-10-CM

## 2016-07-01 MED ORDER — VENLAFAXINE HCL ER 75 MG PO CP24: ORAL_CAPSULE | ORAL | 1 refills | Status: DC

## 2016-07-13 ENCOUNTER — Encounter: Payer: Self-pay | Admitting: Primary Care

## 2016-07-13 ENCOUNTER — Telehealth: Payer: 59 | Admitting: Nurse Practitioner

## 2016-07-13 DIAGNOSIS — J019 Acute sinusitis, unspecified: Secondary | ICD-10-CM

## 2016-07-13 MED ORDER — AMOXICILLIN-POT CLAVULANATE 875-125 MG PO TABS: 1.0000 | ORAL_TABLET | Freq: Two times a day (BID) | ORAL | 0 refills | Status: DC

## 2016-07-13 NOTE — Progress Notes (Signed)

## 2016-07-15 ENCOUNTER — Telehealth: Payer: Self-pay

## 2016-07-15 NOTE — Telephone Encounter (Signed)
Pt had evisit on 07/13/16.

## 2016-07-15 NOTE — Telephone Encounter (Signed)
PLEASE NOTE: All timestamps contained within this report are represented as [place] Standard Time. CONFIDENTIALTY NOTICE: This fax transmission is intended only for the addressee. It contains information that is legally privileged, confidential or otherwise protected from use or disclosure. If you are not the intended recipient, you are strictly prohibited from reviewing, disclosing, copying using or disseminating any of this information or taking any action in reliance on or regarding this information. If you have received this fax in error, please notify us immediately by telephone so that we can arrange for its return to us. Phone: [phone], Toll-Free: [phone], Fax: [phone] Page: 1 of 2 Call Id: [id number] [facility] Night - Client TELEPHONE ADVICE RECORD [facility] Patient Name: Molly BameBRANDI Miller Gender: Female DOB: 06/23/1981 Age: 35 Y 5 M 4 D Return Phone Number: [phone] (Primary) Address: City/State/Zip: [place] Client [facility] Night - Client Client Site [facility] - Night Physician Vernona Riegerlark, Katherine - NP Contact Type Call Who Is Calling Patient / Member / Family / Caregiver Call Type Triage / Clinical Relationship To Patient Self Return Phone Number [phone] (Primary) Chief Complaint Earache Reason for Call Symptomatic / Request for Health Information Initial Comment Caller states [place]. Her ears are starting to hurt and has congestion. PreDisposition Did not know what to do Translation No Nurse Assessment Nurse: Katrinka BlazingSmith, RN, Cala BradfordKimberly Date/Time (Eastern Time): 07/13/2016 11:41:15 AM Confirm and document reason for call. If symptomatic, describe symptoms. You must click the next button to save text entered. ---Caller states [place]. Her ears are starting to hurt and has congestion (sinus and ears). No fever. Has the  patient traveled out of the country within the last 30 days? ---No Does the patient have any new or worsening symptoms? ---Yes Will a triage be completed? ---Yes Related visit to physician within the last 2 weeks? ---No Does the PT have any chronic conditions? (i.e. diabetes, asthma, etc.) ---No Is the patient pregnant or possibly pregnant? (Ask all females between the ages of 3412-55) ---No Is this a behavioral health or substance abuse call? ---No Guidelines Guideline Title Affirmed Question Affirmed Notes Nurse Date/Time (Eastern Time) Earache Walking is very unsteady Katrinka BlazingSmith, Automotive engineerN, Kimberly 07/13/2016 11:43:17 AM Disp. Time Lamount Cohen(Eastern Time) Disposition Final User 07/13/2016 11:23:03 AM Attempt made - message left Tiana LoftSmith, RN, Kimberly 07/13/2016 11:47:00 AM See Physician within 4 Hours (or PCP triage) Yes Katrinka BlazingSmith, RN, Cala BradfordKimberly PLEASE NOTE: All timestamps contained within this report are represented as [place] Standard Time. CONFIDENTIALTY NOTICE: This fax transmission is intended only for the addressee. It contains information that is legally privileged, confidential or otherwise protected from use or disclosure. If you are not the intended recipient, you are strictly prohibited from reviewing, disclosing, copying using or disseminating any of this information or taking any action in reliance on or regarding this information. If you have received this fax in error, please notify us immediately by telephone so that we can arrange for its return to us. Phone: [phone], Toll-Free: [phone], Fax: [phone] Page: 2 of 2 Call Id: [id number] Caller Understands: Yes Disagree/Comply: Comply Care Advice Given Per Guideline SEE PHYSICIAN WITHIN 4 HOURS (or PCP triage): * IF OFFICE WILL BE CLOSED AND NO PCP TRIAGE: You need to be seen within the next 3 or 4 hours. A nearby Urgent Care Center is often a good source of care. Another choice is to go to the  ER. Go sooner if you become worse. DRIVING:  Another adult should drive. CALL BACK IF: CARE ADVICE given per Earache (Adult) guideline. * You become worse. After Care Instructions Given Call Event Type User Date / Time Description Education document email Tiana Loft 07/13/2016 11:47:49 AM [facility] Instructions Referrals GO TO FACILITY UNDECIDED

## 2016-07-15 NOTE — Telephone Encounter (Signed)
Noted, evisit on 09/09 and email sent on 09/10 for over-the-counter treatment options.

## 2016-08-20 ENCOUNTER — Encounter: Payer: Self-pay | Admitting: Primary Care

## 2016-09-25 ENCOUNTER — Other Ambulatory Visit: Payer: Self-pay | Admitting: Primary Care

## 2016-09-25 DIAGNOSIS — F419 Anxiety disorder, unspecified: Principal | ICD-10-CM

## 2016-09-25 DIAGNOSIS — F329 Major depressive disorder, single episode, unspecified: Secondary | ICD-10-CM

## 2016-09-25 DIAGNOSIS — F32A Depression, unspecified: Secondary | ICD-10-CM

## 2016-09-25 MED ORDER — VENLAFAXINE HCL ER 75 MG PO CP24: ORAL_CAPSULE | ORAL | 1 refills | Status: DC

## 2016-09-25 NOTE — Telephone Encounter (Signed)
Received faxed refill request for  venlafaxine XR (EFFEXOR-XR) 75 MG 24 hr capsule

## 2016-10-10 ENCOUNTER — Encounter: Payer: Self-pay | Admitting: Primary Care

## 2016-10-11 ENCOUNTER — Telehealth: Payer: Self-pay | Admitting: Primary Care

## 2016-10-11 ENCOUNTER — Encounter: Payer: Self-pay | Admitting: Primary Care

## 2017-03-14 ENCOUNTER — Encounter: Payer: Self-pay | Admitting: Primary Care

## 2017-03-21 ENCOUNTER — Ambulatory Visit: Payer: 59

## 2017-04-02 ENCOUNTER — Other Ambulatory Visit: Payer: Self-pay | Admitting: Primary Care

## 2017-04-02 DIAGNOSIS — F329 Major depressive disorder, single episode, unspecified: Secondary | ICD-10-CM

## 2017-04-02 DIAGNOSIS — F419 Anxiety disorder, unspecified: Principal | ICD-10-CM

## 2017-04-02 DIAGNOSIS — F32A Depression, unspecified: Secondary | ICD-10-CM

## 2017-04-02 NOTE — Telephone Encounter (Signed)
Received refill electronically Last office visit 06/13/16 Last refill 09/25/16 #90/1

## 2017-04-03 NOTE — Telephone Encounter (Signed)
Send patient a message through MyChart to schedule follow up.

## 2017-04-10 NOTE — Telephone Encounter (Signed)
Message left for patient to return my call.  

## 2017-04-14 ENCOUNTER — Encounter: Payer: Self-pay | Admitting: Primary Care

## 2017-04-15 NOTE — Telephone Encounter (Signed)
Noted. Patient viewed results and Kate's comments on MyChart.  

## 2017-07-05 ENCOUNTER — Other Ambulatory Visit: Payer: Self-pay | Admitting: Primary Care

## 2017-07-05 DIAGNOSIS — F419 Anxiety disorder, unspecified: Principal | ICD-10-CM

## 2017-07-05 DIAGNOSIS — F329 Major depressive disorder, single episode, unspecified: Secondary | ICD-10-CM

## 2017-07-05 DIAGNOSIS — F32A Depression, unspecified: Secondary | ICD-10-CM

## 2017-07-08 ENCOUNTER — Encounter: Payer: Self-pay | Admitting: Primary Care

## 2017-07-08 DIAGNOSIS — F32A Depression, unspecified: Secondary | ICD-10-CM

## 2017-07-08 DIAGNOSIS — F329 Major depressive disorder, single episode, unspecified: Secondary | ICD-10-CM

## 2017-07-08 DIAGNOSIS — F419 Anxiety disorder, unspecified: Principal | ICD-10-CM

## 2017-07-08 MED ORDER — VENLAFAXINE HCL ER 75 MG PO CP24: ORAL_CAPSULE | ORAL | 0 refills | Status: DC

## 2017-10-03 ENCOUNTER — Other Ambulatory Visit: Payer: Self-pay | Admitting: Primary Care

## 2017-10-03 DIAGNOSIS — F329 Major depressive disorder, single episode, unspecified: Secondary | ICD-10-CM

## 2017-10-03 DIAGNOSIS — F32A Depression, unspecified: Secondary | ICD-10-CM

## 2017-10-03 DIAGNOSIS — F419 Anxiety disorder, unspecified: Principal | ICD-10-CM

## 2017-10-09 ENCOUNTER — Ambulatory Visit: Payer: BLUE CROSS/BLUE SHIELD | Admitting: Primary Care

## 2017-10-09 ENCOUNTER — Encounter: Payer: Self-pay | Admitting: Primary Care

## 2017-10-09 VITALS — BP 122/78 | HR 96 | Temp 97.9°F | Ht 69.0 in | Wt 265.1 lb

## 2017-10-09 DIAGNOSIS — F419 Anxiety disorder, unspecified: Secondary | ICD-10-CM

## 2017-10-09 DIAGNOSIS — F32A Depression, unspecified: Secondary | ICD-10-CM

## 2017-10-09 DIAGNOSIS — J302 Other seasonal allergic rhinitis: Secondary | ICD-10-CM

## 2017-10-09 DIAGNOSIS — F329 Major depressive disorder, single episode, unspecified: Secondary | ICD-10-CM | POA: Diagnosis not present

## 2017-10-09 MED ORDER — MONTELUKAST SODIUM 10 MG PO TABS: 10.0000 mg | ORAL_TABLET | Freq: Every day | ORAL | 0 refills | Status: DC

## 2017-10-09 MED ORDER — BUPROPION HCL ER (SR) 100 MG PO TB12: 100.0000 mg | ORAL_TABLET | Freq: Two times a day (BID) | ORAL | 1 refills | Status: DC

## 2017-10-09 NOTE — Patient Instructions (Signed)
Continue Effexor XR 75 mg once daily.  Start bupropion 100 mg once daily for one weekly, then increase to one tablet twice daily thereafter.  Try taking the Singular at bedtime without the Xyzal. Update me in a week or so.  Schedule a follow up visit in 6 weeks for re-evaluation.  It was a pleasure to see you today!

## 2017-10-09 NOTE — Progress Notes (Signed)
Subjective:    Patient ID: Molly Miller, female    DOB: 02/28/1981, 36 y.o.   MRN: [id number]  HPI  Molly Miller is a 36 year old female who presents today to discuss anxiety and depression. She is currently managed on Effexor XR 75 mg.   Her [facility], discharged home yesterday. She's been under a lot of stress with this.  Prior to her boyfriend's hospital stay she noticed decreased motivation to do anything, feeling fatigued, difficulty sleeping, feeling sad. She felt as though the Effexor dose wasn't enough. She did go without her Effexor for two days in September and noticed a significant increase in symptoms.   Since her boyfriend's hospital stay her symptoms are worse. She's also feeling more overwhelmed. GAD 7 score of 11 and PHQ 9 score of 19 today. She denies SI/HI.  2) Allergic Rhinitis: Also with chronic symptoms, most of the year. Using Xzyal at this time but has found it not as effective during seasonal changes. She's tried Zyrtec, Allegra, and Claritin in the past without improvement. She's also used nasal sprays which cause her to feel bad.   Review of Systems  Constitutional: Positive for fatigue.  Cardiovascular: Negative for palpitations.  Psychiatric/Behavioral:       See HPI       Past Medical History:  Diagnosis Date  . Acne   . Anxiety   . Depression   . Kidney stone      Social History   Socioeconomic History  . Marital status: Divorced    Spouse name: Not on file  . Number of children: Not on file  . Years of education: Not on file  . Highest education level: Not on file  Social Needs  . Financial resource strain: Not on file  . Food insecurity - worry: Not on file  . Food insecurity - inability: Not on file  . Transportation needs - medical: Not on file  . Transportation needs - non-medical: Not on file  Occupational History  . Not on file  Tobacco Use  . Smoking status:  Never Smoker  . Smokeless tobacco: Never Used  Substance and Sexual Activity  . Alcohol use: Yes    Alcohol/week: 0.0 oz    Comment: social  . Drug use: No  . Sexual activity: Not on file  Other Topics Concern  . Not on file  Social History Narrative   Single   Works at [employer]   Enjoys shopping and antique and [employer] stores, gardening.       Past Surgical History:  Procedure Laterality Date  . CYSTOSCOPY W/ URETERAL STENT PLACEMENT Right 02/14/2016   Procedure: CYSTOSCOPY WITH STENT EXCHANGE;  Surgeon: Hildred LaserBrian James Budzyn, MD;  Location: [facility];  Service: Urology;  Laterality: Right;  . CYSTOSCOPY WITH STENT PLACEMENT Right 01/14/2016   Procedure: RIGHT CYSTOSCOPY, RIGHT RETROGRADE, RIGHT URETERAL STENT PLACEMENT;  Surgeon: Crist FatBenjamin W Herrick, MD;  Location: [facility];  Service: Urology;  Laterality: Right;  . HOLMIUM LASER APPLICATION Right 02/14/2016   Procedure: HOLMIUM LASER APPLICATION;  Surgeon: Hildred LaserBrian James Budzyn, MD;  Location: [facility];  Service: Urology;  Laterality: Right;  . URETEROSCOPY WITH HOLMIUM LASER LITHOTRIPSY Right 02/14/2016   Procedure: URETEROSCOPY WITH HOLMIUM LASER LITHOTRIPSY;  Surgeon: Hildred LaserBrian James Budzyn, MD;  Location: [facility];  Service: Urology;  Laterality: Right;    Family History  Problem Relation Age of Onset  . Hypertension Father   . Arthritis  Father   . Heart disease Father   . Stroke Mother   . Seizures Mother   . Dementia Mother   . Cancer Mother        Cervical?  . Nephrolithiasis Mother   . Hematuria Mother     Allergies  Allergen Reactions  . Tramadol Nausea Only    Current Outpatient Medications on File Prior to Visit  Medication Sig Dispense Refill  . Ascorbic Acid (VITAMIN C) 1000 MG tablet Take 1,000 mg by mouth daily.    Marland Kitchen. venlafaxine XR (EFFEXOR-XR) 75 MG 24 hr capsule TAKE 1 CAPSULE BY MOUTH  DAILY WITH BREAKFAST 90 capsule 0  . spironolactone (ALDACTONE) 50 MG tablet Take 50 mg by mouth daily. Reported on  03/21/2016     No current facility-administered medications on file prior to visit.     BP 122/78   Pulse 96   Temp 97.9 F (36.6 C) (Oral)   Ht 5\' 9"  (1.753 m)   Wt 265 lb 1.9 oz (120.3 kg)   LMP 09/22/2017   SpO2 97%   BMI 39.15 kg/m    Objective:   Physical Exam  Constitutional: She appears well-nourished.  Neck: Neck supple.  Cardiovascular: Normal rate and regular rhythm.  Pulmonary/Chest: Effort normal and breath sounds normal.  Skin: Skin is warm and dry.  Psychiatric: She has a normal mood and affect.          Assessment & Plan:

## 2017-10-09 NOTE — Assessment & Plan Note (Signed)
Taking Xyzal most of the year, not as effective during seasonal changes. Rx for Singulair sent to pharmacy to use during seasonal changes. Can resume Xyzal when ready. She will update.

## 2017-10-09 NOTE — Assessment & Plan Note (Signed)
Depression has increased over the last 3-6 months. PHQ 9 score of 19 and GAD 7 score of 11 today. Discussed options, will continue Effexor at 75 and add on low dose Wellbutrin. Start with 100 mg once daily x 1 week then increase to BID thereafter.  Discussed potential side effects. Follow up in 6 weeks for re-evaluation.

## 2017-10-10 MED ORDER — VENLAFAXINE HCL ER 75 MG PO CP24: ORAL_CAPSULE | ORAL | 3 refills | Status: DC

## 2017-10-29 ENCOUNTER — Encounter: Payer: Self-pay | Admitting: Primary Care

## 2017-11-14 ENCOUNTER — Encounter: Payer: Self-pay | Admitting: Primary Care

## 2017-11-14 DIAGNOSIS — F329 Major depressive disorder, single episode, unspecified: Secondary | ICD-10-CM

## 2017-11-14 DIAGNOSIS — F32A Depression, unspecified: Secondary | ICD-10-CM

## 2017-11-14 DIAGNOSIS — F419 Anxiety disorder, unspecified: Principal | ICD-10-CM

## 2017-11-14 MED ORDER — BUPROPION HCL ER (SR) 100 MG PO TB12: 100.0000 mg | ORAL_TABLET | Freq: Two times a day (BID) | ORAL | 1 refills | Status: DC

## 2018-01-09 ENCOUNTER — Encounter: Payer: Self-pay | Admitting: Primary Care

## 2018-01-09 DIAGNOSIS — F329 Major depressive disorder, single episode, unspecified: Secondary | ICD-10-CM

## 2018-01-09 DIAGNOSIS — F32A Depression, unspecified: Secondary | ICD-10-CM

## 2018-01-09 DIAGNOSIS — F419 Anxiety disorder, unspecified: Principal | ICD-10-CM

## 2018-01-09 MED ORDER — BUPROPION HCL ER (SR) 100 MG PO TB12: 100.0000 mg | ORAL_TABLET | Freq: Every day | ORAL | 1 refills | Status: DC

## 2018-02-25 ENCOUNTER — Encounter: Payer: Self-pay | Admitting: Primary Care

## 2018-03-31 ENCOUNTER — Other Ambulatory Visit: Payer: Self-pay | Admitting: Primary Care

## 2018-03-31 DIAGNOSIS — F419 Anxiety disorder, unspecified: Principal | ICD-10-CM

## 2018-03-31 DIAGNOSIS — F32A Depression, unspecified: Secondary | ICD-10-CM

## 2018-03-31 DIAGNOSIS — F329 Major depressive disorder, single episode, unspecified: Secondary | ICD-10-CM

## 2018-04-01 NOTE — Telephone Encounter (Signed)
Venlafaxine XR  refill request from [facility]  Last OV: 10/09/17 - patient is PAST DUE for follow up Last Refill: #90, 3 refills given/filled on 10/10/17    This request is way too soon.  Request denied and notification sent that patient needs follow up appointment.  Thanks.

## 2018-07-04 ENCOUNTER — Other Ambulatory Visit: Payer: Self-pay | Admitting: Primary Care

## 2018-07-04 DIAGNOSIS — F329 Major depressive disorder, single episode, unspecified: Secondary | ICD-10-CM

## 2018-07-04 DIAGNOSIS — F419 Anxiety disorder, unspecified: Principal | ICD-10-CM

## 2018-07-04 DIAGNOSIS — F32A Depression, unspecified: Secondary | ICD-10-CM

## 2018-10-03 ENCOUNTER — Other Ambulatory Visit: Payer: Self-pay | Admitting: Primary Care

## 2018-10-03 DIAGNOSIS — F419 Anxiety disorder, unspecified: Principal | ICD-10-CM

## 2018-10-03 DIAGNOSIS — F329 Major depressive disorder, single episode, unspecified: Secondary | ICD-10-CM

## 2018-10-03 DIAGNOSIS — F32A Depression, unspecified: Secondary | ICD-10-CM

## 2018-10-05 NOTE — Telephone Encounter (Signed)
Patient needs office follow up visit for further refills of her medications. I sent a 30 day supply of her Effexor medication for now until she can get in. Okay to squeeze her in for CPE or follow up.

## 2018-10-05 NOTE — Telephone Encounter (Signed)
Last office visit 10/09/2017 Seasonal Allergic Rhinitis and Anxiety/depression.  Last refilled 10/10/2017 for #90 with 3 refills.  No future appointments.  Refill?

## 2018-10-16 ENCOUNTER — Ambulatory Visit: Payer: BLUE CROSS/BLUE SHIELD | Admitting: Primary Care

## 2018-10-16 ENCOUNTER — Encounter: Payer: Self-pay | Admitting: Primary Care

## 2018-10-16 VITALS — BP 120/76 | HR 74 | Temp 98.2°F | Ht 69.0 in | Wt 266.5 lb

## 2018-10-16 DIAGNOSIS — R140 Abdominal distension (gaseous): Secondary | ICD-10-CM

## 2018-10-16 DIAGNOSIS — F329 Major depressive disorder, single episode, unspecified: Secondary | ICD-10-CM | POA: Diagnosis not present

## 2018-10-16 DIAGNOSIS — R109 Unspecified abdominal pain: Secondary | ICD-10-CM | POA: Diagnosis not present

## 2018-10-16 DIAGNOSIS — F419 Anxiety disorder, unspecified: Secondary | ICD-10-CM | POA: Diagnosis not present

## 2018-10-16 DIAGNOSIS — Z0000 Encounter for general adult medical examination without abnormal findings: Secondary | ICD-10-CM | POA: Diagnosis not present

## 2018-10-16 DIAGNOSIS — Z23 Encounter for immunization: Secondary | ICD-10-CM | POA: Diagnosis not present

## 2018-10-16 DIAGNOSIS — F32A Depression, unspecified: Secondary | ICD-10-CM

## 2018-10-16 HISTORY — DX: Abdominal distension (gaseous): R14.0

## 2018-10-16 LAB — COMPREHENSIVE METABOLIC PANEL
ALBUMIN: 4.4 g/dL (ref 3.5–5.2)
ALK PHOS: 94 U/L (ref 39–117)
ALT: 33 U/L (ref 0–35)
AST: 23 U/L (ref 0–37)
BILIRUBIN TOTAL: 0.5 mg/dL (ref 0.2–1.2)
BUN: 13 mg/dL (ref 6–23)
CO2: 23 mEq/L (ref 19–32)
Calcium: 9.3 mg/dL (ref 8.4–10.5)
Chloride: 104 mEq/L (ref 96–112)
Creatinine, Ser: 0.54 mg/dL (ref 0.40–1.20)
GFR: 134.51 mL/min (ref >60.00)
Glucose, Bld: 89 mg/dL (ref 70–99)
POTASSIUM: 3.9 meq/L (ref 3.5–5.1)
SODIUM: 137 meq/L (ref 135–145)
TOTAL PROTEIN: 7.1 g/dL (ref 6.0–8.3)

## 2018-10-16 LAB — LIPID PANEL
Cholesterol: 193 mg/dL (ref 0–200)
HDL: 31.8 mg/dL — AB (ref >39.00)
NonHDL: 161.65
TRIGLYCERIDES: 276 mg/dL — AB (ref 0.0–149.0)
Total CHOL/HDL Ratio: 6
VLDL: 55.2 mg/dL — ABNORMAL HIGH (ref 0.0–40.0)

## 2018-10-16 LAB — LDL CHOLESTEROL, DIRECT: Direct LDL: 117 mg/dL

## 2018-10-16 LAB — HEMOGLOBIN A1C: HEMOGLOBIN A1C: 5.7 % (ref 4.6–6.5)

## 2018-10-16 MED ORDER — VENLAFAXINE HCL ER 75 MG PO CP24: 150.0000 mg | ORAL_CAPSULE | Freq: Every day | ORAL | 0 refills | Status: DC

## 2018-10-16 NOTE — Assessment & Plan Note (Addendum)
Chronic for 6-8 months.  Doubt celiac but will check labs today. Also check for alpha gal given inability to tolerate some pork and beef products.  Suspect more IBS and discussed to start targeting foods and eliminating. Handout provided. Exam today unremarkable. No alarm signs.

## 2018-10-16 NOTE — Addendum Note (Signed)
Addended by: Tawnya CrookSAMBATH, Arian Murley on: 10/16/2018 02:29 PM   Modules accepted: Orders

## 2018-10-16 NOTE — Progress Notes (Signed)
Subjective:    Patient ID: Molly Miller, female    DOB: 1981/03/11, 37 y.o.   MRN: [id number]  HPI  Molly Miller is a 37 year old female who presents today for complete physical.  She would like to be tested for gluten intolerance. Over the last 6-8 months she has noticed abdominal bloating with foods such as sandwiches, certain seasonings, chicken pot pie, certain cheeses, eating out at restaurants, pork, some steak. The following day she'll feel joint aches and feels like her body has run a marathon. Sometimes nausea. She's experimented with gluten free food and hasn't noticed the abdominal bloating, joint aches. She denies vomiting, fevers. She has been taking omeprazole 20 mg intermittently with some improvement.   Immunizations: -Tetanus: Unsure, over 10 years.  -Influenza: Declines   Diet: She endorses a fair diet Breakfast: Fast food, trying to make healthy choices. Egg burrito. Lunch: Salad with protein Dinner: Meat, potatoes, green beans Snacks: None Desserts: 3-4 times weekly  Beverages: Water, diet soda, unsweet tea, coffee  Exercise: She is not exercising  Eye exam: Completed in 2018 Dental exam: Completed over one year ago Pap Smear: Completed last in 2016  BP Readings from Last 3 Encounters:  10/16/18 120/76  10/09/17 122/78  06/13/16 126/84    Review of Systems  Constitutional: Negative for unexpected weight change.  HENT: Negative for rhinorrhea.   Respiratory: Negative for cough and shortness of breath.   Cardiovascular: Negative for chest pain.  Gastrointestinal: Positive for abdominal distention and diarrhea. Negative for constipation and vomiting.       See HPI  Genitourinary: Negative for difficulty urinating and menstrual problem.  Musculoskeletal: Negative for arthralgias and myalgias.  Skin: Negative for rash.  Allergic/Immunologic: Negative for environmental allergies.  Neurological: Negative for dizziness, numbness and headaches.    Psychiatric/Behavioral:       Overall doing well on Effexor and Wellbutrin but does have breakthrough anxiety. Doesn't feel as good as she did when Effexor was initiated.        Past Medical History:  Diagnosis Date  . Acne   . Anxiety   . Depression   . Kidney stone      Social History   Socioeconomic History  . Marital status: Divorced    Spouse name: Not on file  . Number of children: Not on file  . Years of education: Not on file  . Highest education level: Not on file  Occupational History  . Not on file  Social Needs  . Financial resource strain: Not on file  . Food insecurity:    Worry: Not on file    Inability: Not on file  . Transportation needs:    Medical: Not on file    Non-medical: Not on file  Tobacco Use  . Smoking status: Never Smoker  . Smokeless tobacco: Never Used  Substance and Sexual Activity  . Alcohol use: Yes    Alcohol/week: 0.0 standard drinks    Comment: social  . Drug use: No  . Sexual activity: Not on file  Lifestyle  . Physical activity:    Days per week: Not on file    Minutes per session: Not on file  . Stress: Not on file  Relationships  . Social connections:    Talks on phone: Not on file    Gets together: Not on file    Attends religious service: Not on file    Active member of club or organization: Not on file  Attends meetings of clubs or organizations: Not on file    Relationship status: Not on file  . Intimate partner violence:    Fear of current or ex partner: Not on file    Emotionally abused: Not on file    Physically abused: Not on file    Forced sexual activity: Not on file  Other Topics Concern  . Not on file  Social History Narrative   Single   Works at [employer]   Enjoys shopping and antique and [employer] stores, gardening.       Past Surgical History:  Procedure Laterality Date  . CYSTOSCOPY W/ URETERAL STENT PLACEMENT Right 02/14/2016   Procedure: CYSTOSCOPY WITH STENT EXCHANGE;  Surgeon: Hildred LaserBrian  James Budzyn, MD;  Location: [facility];  Service: Urology;  Laterality: Right;  . CYSTOSCOPY WITH STENT PLACEMENT Right 01/14/2016   Procedure: RIGHT CYSTOSCOPY, RIGHT RETROGRADE, RIGHT URETERAL STENT PLACEMENT;  Surgeon: Crist FatBenjamin W Herrick, MD;  Location: [facility];  Service: Urology;  Laterality: Right;  . HOLMIUM LASER APPLICATION Right 02/14/2016   Procedure: HOLMIUM LASER APPLICATION;  Surgeon: Hildred LaserBrian James Budzyn, MD;  Location: [facility];  Service: Urology;  Laterality: Right;  . URETEROSCOPY WITH HOLMIUM LASER LITHOTRIPSY Right 02/14/2016   Procedure: URETEROSCOPY WITH HOLMIUM LASER LITHOTRIPSY;  Surgeon: Hildred LaserBrian James Budzyn, MD;  Location: [facility];  Service: Urology;  Laterality: Right;    Family History  Problem Relation Age of Onset  . Hypertension Father   . Arthritis Father   . Heart disease Father   . Stroke Mother   . Seizures Mother   . Dementia Mother   . Cancer Mother        Cervical?  . Nephrolithiasis Mother   . Hematuria Mother     Allergies  Allergen Reactions  . Tramadol Nausea Only    Current Outpatient Medications on File Prior to Visit  Medication Sig Dispense Refill  . Ascorbic Acid (VITAMIN C) 1000 MG tablet Take 1,000 mg by mouth daily.    Marland Kitchen. buPROPion (WELLBUTRIN SR) 100 MG 12 hr tablet TAKE 1 TABLET BY MOUTH EVERY DAY 90 tablet 1  . omeprazole (PRILOSEC) 20 MG capsule Take 20 mg by mouth daily.    Marland Kitchen. venlafaxine XR (EFFEXOR-XR) 75 MG 24 hr capsule TAKE 1 CAPSULE BY MOUTH DAILY WITH BREAKFAST 30 capsule 0   No current facility-administered medications on file prior to visit.     BP 120/76   Pulse 74   Temp 98.2 F (36.8 C) (Oral)   Ht 5\' 9"  (1.753 m)   Wt 266 lb 8 oz (120.9 kg)   LMP 10/11/2018   SpO2 95%   BMI 39.36 kg/m    Objective:   Physical Exam  Constitutional: She is oriented to person, place, and time. She appears well-nourished.  HENT:  Mouth/Throat: No oropharyngeal exudate.  Eyes: Pupils are equal, round, and reactive to light.  EOM are normal.  Neck: Neck supple. No thyromegaly present.  Cardiovascular: Normal rate and regular rhythm.  Respiratory: Effort normal and breath sounds normal.  GI: Soft. Bowel sounds are normal. There is no abdominal tenderness.  Musculoskeletal: Normal range of motion.  Neurological: She is alert and oriented to person, place, and time.  Skin: Skin is warm and dry.  Psychiatric: She has a normal mood and affect.           Assessment & Plan:

## 2018-10-16 NOTE — Assessment & Plan Note (Signed)
Tetanus due, provided today. Declines influenza vaccination. Pap smear due, she declines today and will reschedule. Discussed the importance of a healthy diet and regular exercise in order for weight loss, and to reduce the risk of any potential medical problems. Exam unremarkable. Labs pending. Follow up in 1 year for CPE.

## 2018-10-16 NOTE — Assessment & Plan Note (Signed)
Breakthrough anxiety on current regimen, doesn't feel as effective as it once was. Will trail an increase in Effexor to 150 mg, she will take two of her 75 mg capsules for a few weeks and update. Continue Wellbutrin.

## 2018-10-16 NOTE — Patient Instructions (Signed)
We've increased your dose of venlafaxine (Effexor) to 150 mg daily. Continue bupropion 100 mg daily. Please update me in a few weeks.  Start exercising. You should be getting 150 minutes of moderate intensity exercise weekly.  It's important to improve your diet by reducing consumption of fast food, fried food, processed snack foods, sugary drinks. Increase consumption of fresh vegetables and fruits, whole grains, water.  Ensure you are drinking 64 ounces of water daily.  Stop by the lab prior to leaving today. I will notify you of your results once received.   We will see you in one year for your annual exam or sooner if needed.   Diet for Irritable Bowel Syndrome When you have irritable bowel syndrome (IBS), the foods you eat and your eating habits are very important. IBS may cause various symptoms, such as abdominal pain, constipation, or diarrhea. Choosing the right foods can help ease discomfort caused by these symptoms. Work with your health care provider and dietitian to find the best eating plan to help control your symptoms. What general guidelines do I need to follow?  Keep a food diary. This will help you identify foods that cause symptoms. Write down: ? What you eat and when. ? What symptoms you have. ? When symptoms occur in relation to your meals.  Avoid foods that cause symptoms. Talk with your dietitian about other ways to get the same nutrients that are in these foods.  Eat more foods that contain fiber. Take a fiber supplement if directed by your dietitian.  Eat your meals slowly, in a relaxed setting.  Aim to eat 5-6 small meals per day. Do not skip meals.  Drink enough fluids to keep your urine clear or pale yellow.  Ask your health care provider if you should take an over-the-counter probiotic during flare-ups to help restore healthy gut bacteria.  If you have cramping or diarrhea, try making your meals low in fat and high in carbohydrates. Examples of  carbohydrates are pasta, rice, whole grain breads and cereals, fruits, and vegetables.  If dairy products cause your symptoms to flare up, try eating less of them. You might be able to handle yogurt better than other dairy products because it contains bacteria that help with digestion. What foods are not recommended? The following are some foods and drinks that may worsen your symptoms:  Fatty foods, such as [place] fries.  Milk products, such as cheese or ice cream.  Chocolate.  Alcohol.  Products with caffeine, such as coffee.  Carbonated drinks, such as soda.  The items listed above may not be a complete list of foods and beverages to avoid. Contact your dietitian for more information. What foods are good sources of fiber? Your health care provider or dietitian may recommend that you eat more foods that contain fiber. Fiber can help reduce constipation and other IBS symptoms. Add foods with fiber to your diet a little at a time so that your body can get used to them. Too much fiber at once might cause gas and swelling of your abdomen. The following are some foods that are good sources of fiber:  Apples.  Peaches.  Pears.  Berries.  Figs.  Broccoli (raw).  Cabbage.  Carrots.  Raw peas.  Kidney beans.  Lima beans.  Whole grain bread.  Whole grain cereal.  Where to find more information: [employer] for Functional Gastrointestinal Disorders: www.iffgd.[employer] of Diabetes and Digestive and Kidney Diseases: NetworkAffair.co.za.aspx This information is not intended to replace  advice given to you by your health care provider. Make sure you discuss any questions you have with your health care provider. Document Released: 01/11/2004 Document Revised: 03/28/2016 Document Reviewed: 01/21/2014 Elsevier Interactive Patient Education  2018 [place] 18-39  Years, Female Preventive care refers to lifestyle choices and visits with your health care provider that can promote health and wellness. What does preventive care include?  A yearly physical exam. This is also called an annual well check.  Dental exams once or twice a year.  Routine eye exams. Ask your health care provider how often you should have your eyes checked.  Personal lifestyle choices, including: ? Daily care of your teeth and gums. ? Regular physical activity. ? Eating a healthy diet. ? Avoiding tobacco and drug use. ? Limiting alcohol use. ? Practicing safe sex. ? Taking vitamin and mineral supplements as recommended by your health care provider. What happens during an annual well check? The services and screenings done by your health care provider during your annual well check will depend on your age, overall health, lifestyle risk factors, and family history of disease. Counseling Your health care provider may ask you questions about your:  Alcohol use.  Tobacco use.  Drug use.  Emotional well-being.  Home and relationship well-being.  Sexual activity.  Eating habits.  Work and work Statistician.  Method of birth control.  Menstrual cycle.  Pregnancy history.  Screening You may have the following tests or measurements:  Height, weight, and BMI.  Diabetes screening. This is done by checking your blood sugar (glucose) after you have not eaten for a while (fasting).  Blood pressure.  Lipid and cholesterol levels. These may be checked every 5 years starting at age 55.  Skin check.  Hepatitis C blood test.  Hepatitis B blood test.  Sexually transmitted disease (STD) testing.  BRCA-related cancer screening. This may be done if you have a family history of breast, ovarian, tubal, or peritoneal cancers.  Pelvic exam and Pap test. This may be done every 3 years starting at age 13. Starting at age 26, this may be done every 5 years if you have a  Pap test in combination with an HPV test.  Discuss your test results, treatment options, and if necessary, the need for more tests with your health care provider. Vaccines Your health care provider may recommend certain vaccines, such as:  Influenza vaccine. This is recommended every year.  Tetanus, diphtheria, and acellular pertussis (Tdap, Td) vaccine. You may need a Td booster every 10 years.  Varicella vaccine. You may need this if you have not been vaccinated.  HPV vaccine. If you are 52 or younger, you may need three doses over 6 months.  Measles, mumps, and rubella (MMR) vaccine. You may need at least one dose of MMR. You may also need a second dose.  Pneumococcal 13-valent conjugate (PCV13) vaccine. You may need this if you have certain conditions and were not previously vaccinated.  Pneumococcal polysaccharide (PPSV23) vaccine. You may need one or two doses if you smoke cigarettes or if you have certain conditions.  Meningococcal vaccine. One dose is recommended if you are age 37-21 years and a first-year college student living in a residence hall, or if you have one of several medical conditions. You may also need additional booster doses.  Hepatitis A vaccine. You may need this if you have certain conditions or if you travel or work in places where you may be exposed to hepatitis  A.  Hepatitis B vaccine. You may need this if you have certain conditions or if you travel or work in places where you may be exposed to hepatitis B.  Haemophilus influenzae type b (Hib) vaccine. You may need this if you have certain risk factors.  Talk to your health care provider about which screenings and vaccines you need and how often you need them. This information is not intended to replace advice given to you by your health care provider. Make sure you discuss any questions you have with your health care provider. Document Released: 12/17/2001 Document Revised: 07/10/2016 Document Reviewed:  08/22/2015 Elsevier Interactive Patient Education  [employer].

## 2018-10-16 NOTE — Addendum Note (Signed)
Addended by: Doreene NestLARK, Mase Dhondt K on: 10/16/2018 11:29 AM   Modules accepted: Orders

## 2018-10-21 LAB — ALPHA-GAL PANEL
Beef IgE: <0.1 kU/L (ref <0.35)
Class: 0
Class: 0
Class: 0
Galactose-alpha-1,3-galactose IgE: <0.1 kU/L (ref <0.10)

## 2018-10-22 LAB — CELIAC PNL 2 RFLX ENDOMYSIAL AB TTR
(tTG) Ab, IgG: <1 U/mL
ENDOMYSIAL AB IGA: NEGATIVE
GLIADIN IGA: 5 U (ref <20)
GLIADIN IGG: 3 U (ref <20)
Immunoglobulin A: 157 mg/dL (ref 47–310)

## 2018-10-29 DIAGNOSIS — F419 Anxiety disorder, unspecified: Principal | ICD-10-CM

## 2018-10-29 DIAGNOSIS — F329 Major depressive disorder, single episode, unspecified: Secondary | ICD-10-CM

## 2018-10-29 DIAGNOSIS — F32A Depression, unspecified: Secondary | ICD-10-CM

## 2018-10-29 MED ORDER — VENLAFAXINE HCL ER 75 MG PO CP24: 150.0000 mg | ORAL_CAPSULE | Freq: Every day | ORAL | 1 refills | Status: DC

## 2018-12-18 ENCOUNTER — Other Ambulatory Visit: Payer: Self-pay | Admitting: Primary Care

## 2018-12-18 DIAGNOSIS — F329 Major depressive disorder, single episode, unspecified: Secondary | ICD-10-CM

## 2018-12-18 DIAGNOSIS — F32A Depression, unspecified: Secondary | ICD-10-CM

## 2018-12-18 DIAGNOSIS — F419 Anxiety disorder, unspecified: Principal | ICD-10-CM

## 2018-12-31 ENCOUNTER — Other Ambulatory Visit: Payer: Self-pay | Admitting: Primary Care

## 2018-12-31 DIAGNOSIS — F329 Major depressive disorder, single episode, unspecified: Secondary | ICD-10-CM

## 2018-12-31 DIAGNOSIS — F32A Depression, unspecified: Secondary | ICD-10-CM

## 2018-12-31 DIAGNOSIS — F419 Anxiety disorder, unspecified: Principal | ICD-10-CM

## 2019-01-26 NOTE — Telephone Encounter (Signed)
Molly Miller, will you get her scheduled for an in office visit?

## 2019-01-26 NOTE — Telephone Encounter (Signed)
Noted. Appointment has been schedule 01/27/2019 at 2pm

## 2019-01-27 ENCOUNTER — Ambulatory Visit: Payer: BLUE CROSS/BLUE SHIELD | Admitting: Primary Care

## 2019-01-27 ENCOUNTER — Other Ambulatory Visit: Payer: Self-pay | Admitting: Primary Care

## 2019-01-27 ENCOUNTER — Other Ambulatory Visit: Payer: Self-pay

## 2019-01-27 ENCOUNTER — Ambulatory Visit (INDEPENDENT_AMBULATORY_CARE_PROVIDER_SITE_OTHER)
Admission: RE | Admit: 2019-01-27 | Discharge: 2019-01-27 | Disposition: A | Discharge status: Home / Self Care | Payer: BLUE CROSS/BLUE SHIELD | Source: Ambulatory Visit | Attending: Primary Care | Admitting: Primary Care

## 2019-01-27 ENCOUNTER — Encounter: Payer: Self-pay | Admitting: Primary Care

## 2019-01-27 VITALS — BP 124/80 | HR 100 | Temp 98.2°F | Ht 69.0 in | Wt 268.8 lb

## 2019-01-27 DIAGNOSIS — F419 Anxiety disorder, unspecified: Principal | ICD-10-CM

## 2019-01-27 DIAGNOSIS — M25572 Pain in left ankle and joints of left foot: Secondary | ICD-10-CM | POA: Diagnosis not present

## 2019-01-27 DIAGNOSIS — F32A Depression, unspecified: Secondary | ICD-10-CM

## 2019-01-27 DIAGNOSIS — F329 Major depressive disorder, single episode, unspecified: Secondary | ICD-10-CM

## 2019-01-27 HISTORY — DX: Pain in left ankle and joints of left foot: M25.572

## 2019-01-27 LAB — CBC WITH DIFFERENTIAL/PLATELET
BASOS PCT: 1.5 % (ref 0.0–3.0)
Basophils Absolute: 0.2 10*3/uL — ABNORMAL HIGH (ref 0.0–0.1)
Eosinophils Absolute: 0.1 10*3/uL (ref 0.0–0.7)
Eosinophils Relative: 1.1 % (ref 0.0–5.0)
HEMATOCRIT: 44.2 % (ref 36.0–46.0)
Hemoglobin: 15.1 g/dL — ABNORMAL HIGH (ref 12.0–15.0)
LYMPHS PCT: 14.7 % (ref 12.0–46.0)
Lymphs Abs: 1.5 10*3/uL (ref 0.7–4.0)
MCHC: 34.2 g/dL (ref 30.0–36.0)
MCV: 83.5 fl (ref 78.0–100.0)
Monocytes Absolute: 0.5 10*3/uL (ref 0.1–1.0)
Monocytes Relative: 5.1 % (ref 3.0–12.0)
Neutro Abs: 8.1 10*3/uL — ABNORMAL HIGH (ref 1.4–7.7)
Neutrophils Relative %: 77.6 % — ABNORMAL HIGH (ref 43.0–77.0)
Platelets: 259 10*3/uL (ref 150.0–400.0)
RBC: 5.29 Mil/uL — ABNORMAL HIGH (ref 3.87–5.11)
RDW: 13.3 % (ref 11.5–15.5)
WBC: 10.5 10*3/uL (ref 4.0–10.5)

## 2019-01-27 LAB — URIC ACID: Uric Acid, Serum: 4.7 mg/dL (ref 2.4–7.0)

## 2019-01-27 NOTE — Patient Instructions (Signed)
Complete xray(s) and lab prior to leaving today. I will notify you of your results once received.  Elevate your ankle and apply ice for swelling.   It was a pleasure to see you today!

## 2019-01-27 NOTE — Progress Notes (Signed)
Subjective:    Patient ID: Molly Miller, female    DOB: 02-09-1981, 38 y.o.   MRN: [id number]  HPI  Molly Miller is a 38 year old female who presents today with a chief complaint of ankle pain and swelling.  Her pain is located to the left alteral ankle that she first noticed 2-3 weeks ago. Since then her swelling has progressed. She describes her pain as burning that begins to the left lateral malleolus with radiation across to the medial side of her foot. Decrease in ROM with pain with left lateral movement. She denies recent injury/trauma, plantar foot pain, erythema, changes in medications/food.   BP Readings from Last 3 Encounters:  01/27/19 124/80  10/16/18 120/76  10/09/17 122/78     Review of Systems  Respiratory: Negative for shortness of breath.   Cardiovascular: Negative for chest pain.  Musculoskeletal:       Ankle edema  Skin: Negative for color change and wound.  Neurological: Negative for weakness.       Past Medical History:  Diagnosis Date  . Acne   . Anxiety   . Depression   . Kidney stone      Social History   Socioeconomic History  . Marital status: Divorced    Spouse name: Not on file  . Number of children: Not on file  . Years of education: Not on file  . Highest education level: Not on file  Occupational History  . Not on file  Social Needs  . Financial resource strain: Not on file  . Food insecurity:    Worry: Not on file    Inability: Not on file  . Transportation needs:    Medical: Not on file    Non-medical: Not on file  Tobacco Use  . Smoking status: Never Smoker  . Smokeless tobacco: Never Used  Substance and Sexual Activity  . Alcohol use: Yes    Alcohol/week: 0.0 standard drinks    Comment: social  . Drug use: No  . Sexual activity: Not on file  Lifestyle  . Physical activity:    Days per week: Not on file    Minutes per session: Not on file  . Stress: Not on file  Relationships  . Social connections:    Talks on  phone: Not on file    Gets together: Not on file    Attends religious service: Not on file    Active member of club or organization: Not on file    Attends meetings of clubs or organizations: Not on file    Relationship status: Not on file  . Intimate partner violence:    Fear of current or ex partner: Not on file    Emotionally abused: Not on file    Physically abused: Not on file    Forced sexual activity: Not on file  Other Topics Concern  . 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       Past Surgical History:  Procedure Laterality Date  . CYSTOSCOPY W/ URETERAL STENT PLACEMENT Right 02/14/2016   Procedure: CYSTOSCOPY WITH STENT EXCHANGE;  Surgeon: Hildred Laser, MD;  Location: [facility];  Service: Urology;  Laterality: Right;  . CYSTOSCOPY WITH STENT PLACEMENT Right 01/14/2016   Procedure: RIGHT CYSTOSCOPY, RIGHT RETROGRADE, RIGHT URETERAL STENT PLACEMENT;  Surgeon: Crist Fat, MD;  Location: [facility];  Service: Urology;  Laterality: Right;  .  HOLMIUM LASER APPLICATION Right 02/14/2016   Procedure: HOLMIUM LASER APPLICATION;  Surgeon: Hildred Laser, MD;  Location: [facility];  Service: Urology;  Laterality: Right;  . URETEROSCOPY WITH HOLMIUM LASER LITHOTRIPSY Right 02/14/2016   Procedure: URETEROSCOPY WITH HOLMIUM LASER LITHOTRIPSY;  Surgeon: Hildred Laser, MD;  Location: [facility];  Service: Urology;  Laterality: Right;    Family History  Problem Relation Age of Onset  . Hypertension Father   . Arthritis Father   . Heart disease Father   . Stroke Mother   . Seizures Mother   . Dementia Mother   . Cancer Mother        Cervical?  . Nephrolithiasis Mother   . Hematuria Mother     Allergies  Allergen Reactions  . Tramadol Nausea Only    Current Outpatient Medications on File Prior to Visit  Medication Sig Dispense Refill  . Ascorbic Acid (VITAMIN C) 1000 MG  tablet Take 1,000 mg by mouth daily.    Marland Kitchen buPROPion (WELLBUTRIN SR) 100 MG 12 hr tablet TAKE 1 TABLET BY MOUTH TWICE A DAY 180 tablet 1  . omeprazole (PRILOSEC) 20 MG capsule Take 20 mg by mouth daily.    Marland Kitchen venlafaxine XR (EFFEXOR-XR) 75 MG 24 hr capsule TAKE 2 CAPSULES (150 MG TOTAL) BY MOUTH DAILY WITH BREAKFAST. 180 capsule 2   No current facility-administered medications on file prior to visit.     BP 124/80   Pulse 100   Temp 98.2 F (36.8 C) (Oral)   Ht 5\' 9"  (1.753 m)   Wt 268 lb 12 oz (121.9 kg)   LMP 12/27/2018   SpO2 97%   BMI 39.69 kg/m    Objective:   Physical Exam  Cardiovascular: Normal rate.  Pulses:      Dorsalis pedis pulses are 2+ on the left side.       Posterior tibial pulses are 2+ on the left side.  Respiratory: Effort normal.  Musculoskeletal:     Left ankle: She exhibits decreased range of motion and swelling. She exhibits no deformity and normal pulse. No tenderness. No lateral malleolus tenderness found.       Feet:     Comments: Decrease in ROM with pain with external rotation. No erythema. Moderate swelling.   Neurological:  Normal sensation to plantar feet  Skin: Skin is warm and dry. No erythema.           Assessment & Plan:

## 2019-01-27 NOTE — Assessment & Plan Note (Signed)
Present for 2-3 weeks. No trauma/injury. No obvious infection or entry wound.  Differentials include soft tissue involvement, gout, swelling from inactivity, fracture (low suspicion).   Discussed to elevate, ice, use compression, NSAID's. Await results.

## 2019-01-28 ENCOUNTER — Other Ambulatory Visit (INDEPENDENT_AMBULATORY_CARE_PROVIDER_SITE_OTHER): Payer: BLUE CROSS/BLUE SHIELD

## 2019-01-28 DIAGNOSIS — M25572 Pain in left ankle and joints of left foot: Secondary | ICD-10-CM

## 2019-01-28 DIAGNOSIS — R2242 Localized swelling, mass and lump, left lower limb: Secondary | ICD-10-CM | POA: Diagnosis not present

## 2019-01-28 LAB — SEDIMENTATION RATE: Sed Rate: 19 mm/hr (ref 0–20)

## 2019-03-12 ENCOUNTER — Ambulatory Visit (INDEPENDENT_AMBULATORY_CARE_PROVIDER_SITE_OTHER): Payer: BLUE CROSS/BLUE SHIELD | Admitting: Primary Care

## 2019-03-12 DIAGNOSIS — Z30019 Encounter for initial prescription of contraceptives, unspecified: Secondary | ICD-10-CM | POA: Diagnosis not present

## 2019-03-12 NOTE — Progress Notes (Signed)
Subjective:    Patient ID: Molly Miller, female    DOB: 22-Nov-1980, 38 y.o.   MRN: [id number]  HPI  Virtual Visit via Video Note  I connected with Molly Miller on 03/12/19 at  3:20 PM EDT by a video enabled telemedicine application and verified that I am speaking with the correct person using two identifiers.  Location: Patient: Home Provider: Office   I discussed the limitations of evaluation and management by telemedicine and the availability of in person appointments. The patient expressed understanding and agreed to proceed.  History of Present Illness:  Molly Miller is a 38 year old female who presents today to discuss birth control.  She would like to start birth control to prevent pregnancy. She's been on Depo Provera as a teenage which caused a tremendous amount of weight. Her LMP was in late March 2020. Menstrual cycles are monthly on average. She denies menorrhagia, dysmenorrhea. She is sexually active.    Observations/Objective:  Alert and oriented. Appears well, not sickly. No distress. Speaking in complete sentences.   Assessment and Plan:  See problem based charting  Follow Up Instructions:  Call the office to schedule a lab only appointment for urine pregnancy test.  Let me know your preference for birth control based off of the options presented.  It was a pleasure to see you today! [name], NP-C    I discussed the assessment and treatment plan with the patient. The patient was provided an opportunity to ask questions and all were answered. The patient agreed with the plan and demonstrated an understanding of the instructions.   The patient was advised to call back or seek an in-person evaluation if the symptoms worsen or if the condition fails to improve as anticipated.     [name], NP    Review of Systems  Respiratory: Negative for shortness of breath.   Cardiovascular: Negative for chest pain and palpitations.   Genitourinary: Negative for menstrual problem and vaginal discharge.       Past Medical History:  Diagnosis Date  . Acne   . Anxiety   . Depression   . Kidney stone      Social History   Socioeconomic History  . Marital status: Divorced    Spouse name: Not on file  . Number of children: Not on file  . Years of education: Not on file  . Highest education level: Not on file  Occupational History  . Not on file  Social Needs  . Financial resource strain: Not on file  . Food insecurity:    Worry: Not on file    Inability: Not on file  . Transportation needs:    Medical: Not on file    Non-medical: Not on file  Tobacco Use  . Smoking status: Never Smoker  . Smokeless tobacco: Never Used  Substance and Sexual Activity  . Alcohol use: Yes    Alcohol/week: 0.0 standard drinks    Comment: social  . Drug use: No  . Sexual activity: Not on file  Lifestyle  . Physical activity:    Days per week: Not on file    Minutes per session: Not on file  . Stress: Not on file  Relationships  . Social connections:    Talks on phone: Not on file    Gets together: Not on file    Attends religious service: Not on file    Active member of club or organization: Not on file  Attends meetings of clubs or organizations: Not on file    Relationship status: Not on file  . Intimate partner violence:    Fear of current or ex partner: Not on file    Emotionally abused: Not on file    Physically abused: Not on file    Forced sexual activity: Not on file  Other Topics Concern  . Not on file  Social History Narrative   Single   Works at [employer]   Enjoys shopping and antique and [employer] stores, gardening.       Past Surgical History:  Procedure Laterality Date  . CYSTOSCOPY W/ URETERAL STENT PLACEMENT Right 02/14/2016   Procedure: CYSTOSCOPY WITH STENT EXCHANGE;  Surgeon: Hildred LaserBrian James Budzyn, MD;  Location: [facility];  Service: Urology;  Laterality: Right;  . CYSTOSCOPY WITH STENT  PLACEMENT Right 01/14/2016   Procedure: RIGHT CYSTOSCOPY, RIGHT RETROGRADE, RIGHT URETERAL STENT PLACEMENT;  Surgeon: Crist FatBenjamin W Herrick, MD;  Location: [facility];  Service: Urology;  Laterality: Right;  . HOLMIUM LASER APPLICATION Right 02/14/2016   Procedure: HOLMIUM LASER APPLICATION;  Surgeon: Hildred LaserBrian James Budzyn, MD;  Location: [facility];  Service: Urology;  Laterality: Right;  . URETEROSCOPY WITH HOLMIUM LASER LITHOTRIPSY Right 02/14/2016   Procedure: URETEROSCOPY WITH HOLMIUM LASER LITHOTRIPSY;  Surgeon: Hildred LaserBrian James Budzyn, MD;  Location: [facility];  Service: Urology;  Laterality: Right;    Family History  Problem Relation Age of Onset  . Hypertension Father   . Arthritis Father   . Heart disease Father   . Stroke Mother   . Seizures Mother   . Dementia Mother   . Cancer Mother        Cervical?  . Nephrolithiasis Mother   . Hematuria Mother     Allergies  Allergen Reactions  . Tramadol Nausea Only    Current Outpatient Medications on File Prior to Visit  Medication Sig Dispense Refill  . Ascorbic Acid (VITAMIN C) 1000 MG tablet Take 1,000 mg by mouth daily.    Marland Kitchen. buPROPion (WELLBUTRIN SR) 100 MG 12 hr tablet TAKE 1 TABLET BY MOUTH TWICE A DAY 180 tablet 1  . omeprazole (PRILOSEC) 20 MG capsule Take 20 mg by mouth daily.    Marland Kitchen. venlafaxine XR (EFFEXOR-XR) 75 MG 24 hr capsule TAKE 2 CAPSULES (150 MG TOTAL) BY MOUTH DAILY WITH BREAKFAST. 180 capsule 1   No current facility-administered medications on file prior to visit.     There were no vitals taken for this visit.   Objective:   Physical Exam  Constitutional: She is oriented to person, place, and time. She appears well-nourished.  Respiratory: Effort normal.  Neurological: She is alert and oriented to person, place, and time.  Psychiatric: She has a normal mood and affect.           Assessment & Plan:

## 2019-03-12 NOTE — Assessment & Plan Note (Addendum)
Patient requesting birth control for pregnancy prevention. She is currently thinking about options including OCP's vs Nuva Ring. We will avoid Depo Provera given risks for weight gain.  Urine pregnancy pending. She will update regarding her final decision for birth control once pregnancy test is complete and is negative.

## 2019-03-12 NOTE — Patient Instructions (Signed)
Call the office to schedule a lab only appointment for urine pregnancy test.  Let me know your preference for birth control based off of the options presented.  It was a pleasure to see you today! [name], NP-C

## 2019-03-18 ENCOUNTER — Other Ambulatory Visit: Payer: Self-pay

## 2019-03-18 ENCOUNTER — Other Ambulatory Visit: Payer: BLUE CROSS/BLUE SHIELD

## 2019-04-29 DIAGNOSIS — Z3009 Encounter for other general counseling and advice on contraception: Secondary | ICD-10-CM

## 2019-06-02 ENCOUNTER — Other Ambulatory Visit: Payer: Self-pay

## 2019-06-02 ENCOUNTER — Ambulatory Visit: Payer: BC Managed Care – PPO | Admitting: Advanced Practice Midwife

## 2019-06-02 ENCOUNTER — Encounter: Payer: Self-pay | Admitting: Advanced Practice Midwife

## 2019-06-02 VITALS — BP 139/72 | HR 72 | Wt 264.0 lb

## 2019-06-02 DIAGNOSIS — Z124 Encounter for screening for malignant neoplasm of cervix: Secondary | ICD-10-CM

## 2019-06-02 DIAGNOSIS — Z3009 Encounter for other general counseling and advice on contraception: Secondary | ICD-10-CM

## 2019-06-02 DIAGNOSIS — Z113 Encounter for screening for infections with a predominantly sexual mode of transmission: Secondary | ICD-10-CM | POA: Diagnosis not present

## 2019-06-02 DIAGNOSIS — Z0000 Encounter for general adult medical examination without abnormal findings: Secondary | ICD-10-CM | POA: Diagnosis not present

## 2019-06-02 DIAGNOSIS — Z1151 Encounter for screening for human papillomavirus (HPV): Secondary | ICD-10-CM | POA: Diagnosis not present

## 2019-06-02 DIAGNOSIS — Z01419 Encounter for gynecological examination (general) (routine) without abnormal findings: Secondary | ICD-10-CM | POA: Diagnosis not present

## 2019-06-02 NOTE — Patient Instructions (Signed)
Preventive Care 21-39 Years Old, Female Preventive care refers to visits with your health care provider and lifestyle choices that can promote health and wellness. This includes:  A yearly physical exam. This may also be called an annual well check.  Regular dental visits and eye exams.  Immunizations.  Screening for certain conditions.  Healthy lifestyle choices, such as eating a healthy diet, getting regular exercise, not using drugs or products that contain nicotine and tobacco, and limiting alcohol use. What can I expect for my preventive care visit? Physical exam Your health care provider will check your:  Height and weight. This may be used to calculate body mass index (BMI), which tells if you are at a healthy weight.  Heart rate and blood pressure.  Skin for abnormal spots. Counseling Your health care provider may ask you questions about your:  Alcohol, tobacco, and drug use.  Emotional well-being.  Home and relationship well-being.  Sexual activity.  Eating habits.  Work and work environment.  Method of birth control.  Menstrual cycle.  Pregnancy history. What immunizations do I need?  Influenza (flu) vaccine  This is recommended every year. Tetanus, diphtheria, and pertussis (Tdap) vaccine  You may need a Td booster every 10 years. Varicella (chickenpox) vaccine  You may need this if you have not been vaccinated. Human papillomavirus (HPV) vaccine  If recommended by your health care provider, you may need three doses over 6 months. Measles, mumps, and rubella (MMR) vaccine  You may need at least one dose of MMR. You may also need a second dose. Meningococcal conjugate (MenACWY) vaccine  One dose is recommended if you are age 19-21 years and a first-year college student living in a residence hall, or if you have one of several medical conditions. You may also need additional booster doses. Pneumococcal conjugate (PCV13) vaccine  You may need  this if you have certain conditions and were not previously vaccinated. Pneumococcal polysaccharide (PPSV23) vaccine  You may need one or two doses if you smoke cigarettes or if you have certain conditions. Hepatitis A vaccine  You may need this if you have certain conditions or if you travel or work in places where you may be exposed to hepatitis A. Hepatitis B vaccine  You may need this if you have certain conditions or if you travel or work in places where you may be exposed to hepatitis B. Haemophilus influenzae type b (Hib) vaccine  You may need this if you have certain conditions. You may receive vaccines as individual doses or as more than one vaccine together in one shot (combination vaccines). Talk with your health care provider about the risks and benefits of combination vaccines. What tests do I need?  Blood tests  Lipid and cholesterol levels. These may be checked every 5 years starting at age 20.  Hepatitis C test.  Hepatitis B test. Screening  Diabetes screening. This is done by checking your blood sugar (glucose) after you have not eaten for a while (fasting).  Sexually transmitted disease (STD) testing.  BRCA-related cancer screening. This may be done if you have a family history of breast, ovarian, tubal, or peritoneal cancers.  Pelvic exam and Pap test. This may be done every 3 years starting at age 21. Starting at age 30, this may be done every 5 years if you have a Pap test in combination with an HPV test. Talk with your health care provider about your test results, treatment options, and if necessary, the need for more tests.   Follow these instructions at home: Eating and drinking   Eat a diet that includes fresh fruits and vegetables, whole grains, lean protein, and low-fat dairy.  Take vitamin and mineral supplements as recommended by your health care provider.  Do not drink alcohol if: ? Your health care provider tells you not to drink. ? You are  pregnant, may be pregnant, or are planning to become pregnant.  If you drink alcohol: ? Limit how much you have to 0-1 drink a day. ? Be aware of how much alcohol is in your drink. In the [place] one drink equals one 12 oz bottle of beer (355 mL), one 5 oz glass of wine (148 mL), or one 1 oz glass of hard liquor (44 mL). Lifestyle  Take daily care of your teeth and gums.  Stay active. Exercise for at least 30 minutes on 5 or more days each week.  Do not use any products that contain nicotine or tobacco, such as cigarettes, e-cigarettes, and chewing tobacco. If you need help quitting, ask your health care provider.  If you are sexually active, practice safe sex. Use a condom or other form of birth control (contraception) in order to prevent pregnancy and STIs (sexually transmitted infections). If you plan to become pregnant, see your health care provider for a preconception visit. What's next?  Visit your health care provider once a year for a well check visit.  Ask your health care provider how often you should have your eyes and teeth checked.  Stay up to date on all vaccines. This information is not intended to replace advice given to you by your health care provider. Make sure you discuss any questions you have with your health care provider. Document Released: 12/17/2001 Document Revised: 07/02/2018 Document Reviewed: 07/02/2018 Elsevier Patient Education  2020 Elsevier Inc.  

## 2019-06-02 NOTE — Progress Notes (Signed)
Discuss IUD  Last intercourse around three weeks ago

## 2019-06-02 NOTE — Progress Notes (Signed)
GYNECOLOGY ANNUAL PREVENTATIVE CARE ENCOUNTER NOTE  History:     Molly Miller is a 38 y.o. female here for a routine annual gynecologic exam.  Current complaints: None.   Denies abnormal vaginal bleeding, discharge, pelvic pain, problems with intercourse or other gynecologic concerns.  Patient is requesting STI screening  Patient requests contraception counseling. Previously prescribed OCPs but experienced mood swings and desires low hormone or non-hormonal contraception.  Patient verbalizes concern for BRCA gene. She endorses presence of BRCA gene variant in a cousin on her father's side of the family. She denies evidence of gene in first or second degree relative.  Patient feels very anxious about this and is performing self breast exams almost daily. She denies concerning findings during any of these exams.\  Patient lives with her partner of 13 years. She stopped smoking for 13 years but restarted when her partner was diagnosed with Guillan-Barre Syndrome two years ago. She endorses 1 PPD. Denies thoughts of SI, HI. Denies concern for IPV.    Gynecologic History Patient's last menstrual period was 05/19/2019. Contraception: OCP (estrogen/progesterone) Last Pap: Remote. Results were: normal with negative HPV Last mammogram: N/A.   Obstetric History OB History  No obstetric history on file.    Past Medical History:  Diagnosis Date  . Acne   . Anxiety   . Depression   . Kidney stone     Past Surgical History:  Procedure Laterality Date  . CYSTOSCOPY W/ URETERAL STENT PLACEMENT Right 02/14/2016   Procedure: CYSTOSCOPY WITH STENT EXCHANGE;  Surgeon: Nickie Retort, MD;  Location: [facility];  Service: Urology;  Laterality: Right;  . CYSTOSCOPY WITH STENT PLACEMENT Right 01/14/2016   Procedure: RIGHT CYSTOSCOPY, RIGHT RETROGRADE, RIGHT URETERAL STENT PLACEMENT;  Surgeon: Ardis Hughs, MD;  Location: [facility];  Service: Urology;  Laterality: Right;  . HOLMIUM LASER  APPLICATION Right 1/61/0960   Procedure: HOLMIUM LASER APPLICATION;  Surgeon: Nickie Retort, MD;  Location: [facility];  Service: Urology;  Laterality: Right;  . URETEROSCOPY WITH HOLMIUM LASER LITHOTRIPSY Right 02/14/2016   Procedure: URETEROSCOPY WITH HOLMIUM LASER LITHOTRIPSY;  Surgeon: Nickie Retort, MD;  Location: [facility];  Service: Urology;  Laterality: Right;    Current Outpatient Medications on File Prior to Visit  Medication Sig Dispense Refill  . buPROPion (WELLBUTRIN SR) 100 MG 12 hr tablet TAKE 1 TABLET BY MOUTH TWICE A DAY 180 tablet 1  . omeprazole (PRILOSEC) 20 MG capsule Take 20 mg by mouth daily.    Marland Kitchen venlafaxine XR (EFFEXOR-XR) 75 MG 24 hr capsule TAKE 2 CAPSULES (150 MG TOTAL) BY MOUTH DAILY WITH BREAKFAST. 180 capsule 1  . Ascorbic Acid (VITAMIN C) 1000 MG tablet Take 1,000 mg by mouth daily.     No current facility-administered medications on file prior to visit.     Allergies  Allergen Reactions  . Tramadol Nausea Only    Social History:  reports that she has never smoked. She has never used smokeless tobacco. She reports current alcohol use. She reports that she does not use drugs.  Family History  Problem Relation Age of Onset  . Hypertension Father   . Arthritis Father   . Heart disease Father   . Stroke Mother   . Seizures Mother   . Dementia Mother   . Cancer Mother        Cervical?  . Nephrolithiasis Mother   . Hematuria Mother     The following portions of the patient's history were reviewed  and updated as appropriate: allergies, current medications, past family history, past medical history, past social history, past surgical history and problem list.  Review of Systems Pertinent items noted in HPI and remainder of comprehensive ROS otherwise negative.  Physical Exam:  BP 139/72   Pulse 72   Wt 264 lb (119.7 kg)   LMP 05/19/2019   BMI 38.99 kg/m  CONSTITUTIONAL: Well-developed, well-nourished female in no acute distress.  HENT:   Normocephalic, atraumatic, External right and left ear normal. Oropharynx is clear and moist EYES: Conjunctivae and EOM are normal. Pupils are equal, round, and reactive to light. No scleral icterus.  NECK: Normal range of motion, supple, no masses.  Normal thyroid.  SKIN: Skin is warm and dry. No rash noted. Not diaphoretic. No erythema. No pallor. MUSCULOSKELETAL: Normal range of motion. No tenderness.  No cyanosis, clubbing, or edema.  2+ distal pulses. NEUROLOGIC: Alert and oriented to person, place, and time. Normal reflexes, muscle tone coordination. No cranial nerve deficit noted. PSYCHIATRIC: Normal mood and affect. Normal behavior. Normal judgment and thought content. CARDIOVASCULAR: Normal heart rate noted, regular rhythm RESPIRATORY: Clear to auscultation bilaterally. Effort and breath sounds normal, no problems with respiration noted. BREASTS: Symmetric in size. No masses, skin changes, nipple drainage, or lymphadenopathy. ABDOMEN: Soft, normal bowel sounds, no distention noted.  No tenderness, rebound or guarding.  PELVIC: Normal appearing external genitalia; normal appearing vaginal mucosa and cervix.  No abnormal discharge noted.  Pap smear obtained.  Normal uterine size, no other palpable masses, no uterine or adnexal tenderness.   Assessment and Plan:    1. Well woman exam with routine gynecological exam - No concerning findings - Consider genetic screening if ongoing anxiety r/t BRCA in third degree relative - Offered smoking cessation information, declined - Reviewed recommendations for weight management, exercise - Cytology - PAP( [facility]) - POCT urine pregnancy - CBC - Hemoglobin A1c - Comprehensive metabolic panel - TSH  2. Encounter for counseling regarding contraception - Reviewed all forms of birth control options available including abstinence; fertility period awareness methods; over the counter/barrier methods; hormonal contraceptive medication including  pill, patch, ring, injection,contraceptive implant; hormonal and nonhormonal IUDs; permanent sterilization options including vasectomy and the various tubal sterilization modalities. Risks and benefits reviewed.  Questions were answered.  Information was given to patient to review.  - Referred to [facility] [place], [email or web address]. Patient strongly considering [facility]  3. Routine screening for STI (sexually transmitted infection) - Hepatitis B surface antigen - Hepatitis C antibody - HIV Antibody (routine testing w rflx) - RPR  Will follow up results of pap smear and manage accordingly. Routine preventative health maintenance measures emphasized. Please refer to After Visit Summary for other counseling recommendations.      Return PRN for IUD placement or in one year for annual well woman  Total visit time 30 minutes. Greater than 50% spent in counseling and coordination or care  Mallie Snooks, MSN, CNM Certified Nurse Midwife, [facility] for [employer], [facility] 06/02/19 11:18 AM

## 2019-06-03 LAB — TSH: TSH: 1.52 u[IU]/mL (ref 0.450–4.500)

## 2019-06-03 LAB — CBC
Hematocrit: 45.2 % (ref 34.0–46.6)
Hemoglobin: 15.5 g/dL (ref 11.1–15.9)
MCH: 28.5 pg (ref 26.6–33.0)
MCHC: 34.3 g/dL (ref 31.5–35.7)
MCV: 83 fL (ref 79–97)
Platelets: 267 10*3/uL (ref 150–450)
RBC: 5.44 x10E6/uL — ABNORMAL HIGH (ref 3.77–5.28)
RDW: 13 % (ref 11.7–15.4)
WBC: 9.6 10*3/uL (ref 3.4–10.8)

## 2019-06-03 LAB — COMPREHENSIVE METABOLIC PANEL
ALT: 32 IU/L (ref 0–32)
AST: 23 IU/L (ref 0–40)
Albumin/Globulin Ratio: 2.1 (ref 1.2–2.2)
Albumin: 4.7 g/dL (ref 3.8–4.8)
Alkaline Phosphatase: 99 IU/L (ref 39–117)
BUN/Creatinine Ratio: 17 (ref 9–23)
BUN: 13 mg/dL (ref 6–20)
Bilirubin Total: 0.5 mg/dL (ref 0.0–1.2)
CO2: 20 mmol/L (ref 20–29)
Calcium: 9.3 mg/dL (ref 8.7–10.2)
Chloride: 102 mmol/L (ref 96–106)
Creatinine, Ser: 0.76 mg/dL (ref 0.57–1.00)
GFR calc Af Amer: 115 mL/min/{1.73_m2} (ref >59)
GFR calc non Af Amer: 100 mL/min/{1.73_m2} (ref >59)
Globulin, Total: 2.2 g/dL (ref 1.5–4.5)
Glucose: 95 mg/dL (ref 65–99)
Potassium: 4.6 mmol/L (ref 3.5–5.2)
Sodium: 140 mmol/L (ref 134–144)
Total Protein: 6.9 g/dL (ref 6.0–8.5)

## 2019-06-03 LAB — HEPATITIS C ANTIBODY: Hep C Virus Ab: <0.1 s/co ratio (ref 0.0–0.9)

## 2019-06-03 LAB — HIV ANTIBODY (ROUTINE TESTING W REFLEX): HIV Screen 4th Generation wRfx: NONREACTIVE

## 2019-06-03 LAB — HEMOGLOBIN A1C
Est. average glucose Bld gHb Est-mCnc: 114 mg/dL
Hgb A1c MFr Bld: 5.6 % (ref 4.8–5.6)

## 2019-06-03 LAB — HEPATITIS B SURFACE ANTIGEN: Hepatitis B Surface Ag: NEGATIVE

## 2019-06-03 LAB — RPR: RPR Ser Ql: NONREACTIVE

## 2019-06-05 LAB — CYTOLOGY - PAP
Diagnosis: NEGATIVE
HPV: NOT DETECTED

## 2019-07-10 ENCOUNTER — Other Ambulatory Visit: Payer: Self-pay | Admitting: Primary Care

## 2019-07-10 DIAGNOSIS — F329 Major depressive disorder, single episode, unspecified: Secondary | ICD-10-CM

## 2019-07-10 DIAGNOSIS — F419 Anxiety disorder, unspecified: Secondary | ICD-10-CM

## 2019-07-10 DIAGNOSIS — F32A Depression, unspecified: Secondary | ICD-10-CM

## 2019-08-18 DIAGNOSIS — F329 Major depressive disorder, single episode, unspecified: Secondary | ICD-10-CM

## 2019-08-18 DIAGNOSIS — F32A Depression, unspecified: Secondary | ICD-10-CM

## 2019-08-18 DIAGNOSIS — F419 Anxiety disorder, unspecified: Secondary | ICD-10-CM

## 2019-08-20 ENCOUNTER — Other Ambulatory Visit: Payer: Self-pay | Admitting: Primary Care

## 2019-08-20 DIAGNOSIS — F419 Anxiety disorder, unspecified: Secondary | ICD-10-CM

## 2019-08-20 DIAGNOSIS — F329 Major depressive disorder, single episode, unspecified: Secondary | ICD-10-CM

## 2019-08-20 DIAGNOSIS — F32A Depression, unspecified: Secondary | ICD-10-CM

## 2019-08-20 MED ORDER — VENLAFAXINE HCL ER 75 MG PO CP24: 150.0000 mg | ORAL_CAPSULE | Freq: Every day | ORAL | 0 refills | Status: DC

## 2019-11-11 ENCOUNTER — Other Ambulatory Visit: Payer: Self-pay | Admitting: Primary Care

## 2019-11-11 DIAGNOSIS — F32A Depression, unspecified: Secondary | ICD-10-CM

## 2019-11-11 DIAGNOSIS — F329 Major depressive disorder, single episode, unspecified: Secondary | ICD-10-CM

## 2019-11-29 ENCOUNTER — Encounter: Payer: BC Managed Care – PPO | Admitting: Primary Care

## 2019-11-29 DIAGNOSIS — Z0289 Encounter for other administrative examinations: Secondary | ICD-10-CM

## 2019-12-02 ENCOUNTER — Encounter: Payer: BC Managed Care – PPO | Admitting: Primary Care

## 2020-01-02 ENCOUNTER — Other Ambulatory Visit: Payer: Self-pay | Admitting: Primary Care

## 2020-01-02 DIAGNOSIS — F32A Depression, unspecified: Secondary | ICD-10-CM

## 2020-01-02 DIAGNOSIS — F329 Major depressive disorder, single episode, unspecified: Secondary | ICD-10-CM

## 2020-01-03 NOTE — Telephone Encounter (Signed)
Last prescribed on 07/10/2019 . Last appointment on 03/12/2019. Patient have not show and cancelled the last 2 scheduled appointments.

## 2020-02-06 ENCOUNTER — Other Ambulatory Visit: Payer: Self-pay | Admitting: Family Medicine

## 2020-02-06 DIAGNOSIS — F329 Major depressive disorder, single episode, unspecified: Secondary | ICD-10-CM

## 2020-02-06 DIAGNOSIS — F32A Depression, unspecified: Secondary | ICD-10-CM

## 2020-02-06 DIAGNOSIS — F419 Anxiety disorder, unspecified: Secondary | ICD-10-CM

## 2020-02-23 ENCOUNTER — Other Ambulatory Visit: Payer: Self-pay | Admitting: Family Medicine

## 2020-02-23 ENCOUNTER — Other Ambulatory Visit: Payer: Self-pay | Admitting: Primary Care

## 2020-02-23 DIAGNOSIS — F419 Anxiety disorder, unspecified: Secondary | ICD-10-CM

## 2020-02-23 DIAGNOSIS — F329 Major depressive disorder, single episode, unspecified: Secondary | ICD-10-CM

## 2020-02-23 DIAGNOSIS — F32A Depression, unspecified: Secondary | ICD-10-CM

## 2020-02-24 NOTE — Telephone Encounter (Signed)
Attempted to reach patient to schedule. No answer and voicemail is full. 

## 2020-02-24 NOTE — Telephone Encounter (Signed)
Attempted to reach patient. No answer and voicemail is full.

## 2020-02-24 NOTE — Telephone Encounter (Signed)
Last filled 01-03-20 #30 Last OV 03/12/2019.  Patient have not show and cancelled the last 2 scheduled appointments. [facility]

## 2020-02-24 NOTE — Telephone Encounter (Signed)
Last filled 01-26-20 #60 Last OV 03/12/2019. Patient have not show and cancelled the last 2 scheduled appointments. [facility]

## 2020-02-24 NOTE — Telephone Encounter (Signed)
Molly Miller, see other note resulting refill request. She must be seen for follow-up before I can refill other medications. Let me know when she is scheduled.

## 2020-02-24 NOTE — Telephone Encounter (Signed)
Will decline refill request that she has failed to follow-up.  Irving Burton, please kindly notify patient that I can no longer fill her prescriptions until we see her for follow-up. If she schedules a follow-up visit I will send a 30-day supply to her pharmacy. Let me know.

## 2020-03-01 ENCOUNTER — Ambulatory Visit (INDEPENDENT_AMBULATORY_CARE_PROVIDER_SITE_OTHER): Payer: BC Managed Care – PPO | Admitting: Primary Care

## 2020-03-01 ENCOUNTER — Encounter: Payer: Self-pay | Admitting: Primary Care

## 2020-03-01 ENCOUNTER — Other Ambulatory Visit: Payer: Self-pay

## 2020-03-01 VITALS — BP 134/74 | HR 111 | Temp 96.5°F | Ht 69.0 in | Wt 262.0 lb

## 2020-03-01 DIAGNOSIS — F329 Major depressive disorder, single episode, unspecified: Secondary | ICD-10-CM

## 2020-03-01 DIAGNOSIS — F32A Depression, unspecified: Secondary | ICD-10-CM

## 2020-03-01 DIAGNOSIS — J302 Other seasonal allergic rhinitis: Secondary | ICD-10-CM

## 2020-03-01 DIAGNOSIS — F419 Anxiety disorder, unspecified: Secondary | ICD-10-CM

## 2020-03-01 MED ORDER — BUPROPION HCL ER (XL) 150 MG PO TB24: 150.0000 mg | ORAL_TABLET | Freq: Every day | ORAL | 0 refills | Status: DC

## 2020-03-01 MED ORDER — VENLAFAXINE HCL ER 37.5 MG PO CP24: ORAL_CAPSULE | ORAL | 0 refills | Status: DC

## 2020-03-01 NOTE — Patient Instructions (Signed)
Wean off of venlafaxine XR for depression and anxiety. Take 2 of the 37.5 mg capsules daily for two weeks, then 1 of the 37.5 mg capsules for 2 weeks, then stop.  Start bupropion XL 150 mg once daily for depression.  Please update me in 4 weeks.  It was a pleasure to see you today!

## 2020-03-01 NOTE — Assessment & Plan Note (Signed)
Deteriorated, likely secondary to home stressors.   Wean off of venlafaxine as this is no longer effective. Rx for 37.5 mg course sent to pharmacy, instructions provided for weaning.   Rx for bupropion XL 150 mg sent to pharmacy. She will update in 4 weeks.

## 2020-03-01 NOTE — Assessment & Plan Note (Signed)
Doing well on Claritin, continue same. 

## 2020-03-01 NOTE — Progress Notes (Signed)
Subjective:    Patient ID: Molly Miller, female    DOB: 1980/11/16, 39 y.o.   MRN: [id number]  HPI  This visit occurred during the SARS-CoV-2 public health emergency.  Safety protocols were in place, including screening questions prior to the visit, additional usage of staff PPE, and extensive cleaning of exam room while observing appropriate contact time as indicated for disinfecting solutions.   Molly Miller is a 39 year old female with a history of anxiety and depression, lower extremity edema, obesity who presents today for follow up of chronic conditions.  Managed on venlafaxine XR 150 mg and bupropion SR 100 mg BID for anxiety and depression. She is actually taking bupropion SR 100 mg once daily. Over the last 6 months she's been under a lot of stress with her home/personal life.   Since then she's had low motivation to do things, increased stressed, irritability, anxiety, tearfulness. She once felt like her medication regimen helped but doesn't feel as though her venalfaxine is helpful.  She ran out of her bupropion last week, has felt worse.   She was once managed on Lexapro and Zoloft which caused her to feel more anxious.   BP Readings from Last 3 Encounters:  03/01/20 134/74  06/02/19 139/72  01/27/19 124/80     Review of Systems  Respiratory: Negative for shortness of breath.   Cardiovascular: Negative for chest pain.  Psychiatric/Behavioral:       See HPI       Past Medical History:  Diagnosis Date  . Acne   . Anxiety   . Depression   . Kidney stone      Social History   Socioeconomic History  . Marital status: Divorced    Spouse name: Not on file  . Number of children: Not on file  . Years of education: Not on file  . Highest education level: Not on file  Occupational History  . Not on file  Tobacco Use  . Smoking status: Never Smoker  . Smokeless tobacco: Never Used  Substance and Sexual Activity  . Alcohol use: Yes    Alcohol/week: 0.0  standard drinks    Comment: social  . Drug use: No  . Sexual activity: Not on file  Other Topics Concern  . 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      Social Determinants of Health   Financial Resource Strain:   . Difficulty of Paying Living Expenses:   Food Insecurity:   . Worried About Charity fundraiser in the Last Year:   . Arboriculturist in the Last Year:   Transportation Needs:   . Film/video editor (Medical):   Marland Kitchen Lack of Transportation (Non-Medical):   Physical Activity:   . Days of Exercise per Week:   . Minutes of Exercise per Session:   Stress:   . Feeling of Stress :   Social Connections:   . Frequency of Communication with Friends and Family:   . Frequency of Social Gatherings with Friends and Family:   . Attends Religious Services:   . Active Member of Clubs or Organizations:   . Attends Archivist Meetings:   Marland Kitchen Marital Status:   Intimate Partner Violence:   . Fear of Current or Ex-Partner:   . Emotionally Abused:   Marland Kitchen Physically Abused:   . Sexually Abused:  Past Surgical History:  Procedure Laterality Date  . CYSTOSCOPY W/ URETERAL STENT PLACEMENT Right 02/14/2016   Procedure: CYSTOSCOPY WITH STENT EXCHANGE;  Surgeon: Hildred Laser, MD;  Location: [facility];  Service: Urology;  Laterality: Right;  . CYSTOSCOPY WITH STENT PLACEMENT Right 01/14/2016   Procedure: RIGHT CYSTOSCOPY, RIGHT RETROGRADE, RIGHT URETERAL STENT PLACEMENT;  Surgeon: Crist Fat, MD;  Location: [facility];  Service: Urology;  Laterality: Right;  . HOLMIUM LASER APPLICATION Right 02/14/2016   Procedure: HOLMIUM LASER APPLICATION;  Surgeon: Hildred Laser, MD;  Location: [facility];  Service: Urology;  Laterality: Right;  . URETEROSCOPY WITH HOLMIUM LASER LITHOTRIPSY Right 02/14/2016   Procedure: URETEROSCOPY WITH HOLMIUM LASER LITHOTRIPSY;  Surgeon: Hildred Laser, MD;  Location: [facility];  Service: Urology;  Laterality: Right;    Family History  Problem Relation Age of Onset  . Hypertension Father   . Arthritis Father   . Heart disease Father   . Stroke Mother   . Seizures Mother   . Dementia Mother   . Cancer Mother        Cervical?  . Nephrolithiasis Mother   . Hematuria Mother     Allergies  Allergen Reactions  . Tramadol Nausea Only    Current Outpatient Medications on File Prior to Visit  Medication Sig Dispense Refill  . Biotin 10 MG TABS      No current facility-administered medications on file prior to visit.    BP 134/74   Pulse (!) 111   Temp (!) 96.5 F (35.8 C) (Temporal)   Ht 5\' 9"  (1.753 m)   Wt 262 lb (118.8 kg)   LMP 02/27/2020   SpO2 98%   BMI 38.69 kg/m    Objective:   Physical Exam  Constitutional: She appears well-nourished.  Cardiovascular: Normal rate and regular rhythm.  Respiratory: Effort normal and breath sounds normal.  Musculoskeletal:     Cervical back: Neck supple.  Skin: Skin is warm and dry.  Psychiatric: She has a normal mood and affect.  Tearful at times during exam           Assessment & Plan:

## 2020-03-23 ENCOUNTER — Other Ambulatory Visit: Payer: Self-pay | Admitting: Primary Care

## 2020-03-23 DIAGNOSIS — F32A Depression, unspecified: Secondary | ICD-10-CM

## 2020-03-23 NOTE — Telephone Encounter (Signed)
Last prescribed on 03/01/2020 . Last OV on 03/01/2020. No future OV scheduled

## 2020-03-24 NOTE — Telephone Encounter (Signed)
No refill needed. We weaned her off of venlafaxine, see office notes from late April 2021.

## 2020-03-27 DIAGNOSIS — F419 Anxiety disorder, unspecified: Secondary | ICD-10-CM

## 2020-03-27 DIAGNOSIS — F32A Depression, unspecified: Secondary | ICD-10-CM

## 2020-03-27 MED ORDER — FLUOXETINE HCL 20 MG PO TABS: 20.0000 mg | ORAL_TABLET | Freq: Every day | ORAL | 1 refills | Status: DC

## 2020-05-02 ENCOUNTER — Encounter: Payer: Self-pay | Admitting: Primary Care

## 2020-05-02 ENCOUNTER — Telehealth (INDEPENDENT_AMBULATORY_CARE_PROVIDER_SITE_OTHER): Payer: BC Managed Care – PPO | Admitting: Primary Care

## 2020-05-02 DIAGNOSIS — F32A Depression, unspecified: Secondary | ICD-10-CM

## 2020-05-02 DIAGNOSIS — F329 Major depressive disorder, single episode, unspecified: Secondary | ICD-10-CM | POA: Diagnosis not present

## 2020-05-02 DIAGNOSIS — F419 Anxiety disorder, unspecified: Secondary | ICD-10-CM

## 2020-05-02 MED ORDER — DULOXETINE HCL 20 MG PO CPEP: 20.0000 mg | ORAL_CAPSULE | Freq: Every day | ORAL | 1 refills | Status: DC

## 2020-05-02 NOTE — Progress Notes (Signed)
Subjective:    Patient ID: Molly Miller, female    DOB: 10-27-1981, 39 y.o.   MRN: [id number]  HPI  Virtual Visit via Video Note  I connected with [name] on 05/02/20 at  9:20 AM EDT by a video enabled telemedicine application and verified that I am speaking with the correct person using two identifiers.  Patient and I are participating in the visit.   Location: Patient: Home Provider: Office   I discussed the limitations of evaluation and management by telemedicine and the availability of in person appointments. The patient expressed understanding and agreed to proceed.  History of Present Illness:  Molly Miller is a 39 year old female with a history of anxiety and depression who presents today for follow up of anxiety and depression.  She was last evaluated in late April 2021 for ongoing symptoms of anxiety and depression, felt as though her venlafaxine had become ineffective, even at a higher dose of XR 150 mg. Given her ongoing symptoms we weaned her off of venlafaxine, refilled bupropion. She sent notification one month later that the bupropion made her feel worse, so we switched her to fluoxetine.  Over the last month, since being on fluoxetine, she's slightly better but she feels "numb". She doesn't feel excitement in anything, though process is slower, having difficulty processing thoughts. Positive effects include less tearfulness, but doesn't really desire to cry.   She was managed on Zoloft and Paxil in the past, neither medications were effective. She was trialed on Lexapro in 2016, but felt "zapped". She felt better on venlafaxine, but didn't feel like she could find a balance with emotions. She once saw therapy, has not been in quite some time. She has never seen psychiatry.    Observations/Objective:  Alert and oriented. Appears well, not sickly. No distress. Speaking in complete sentences.   Assessment and Plan:  Feeling too "numb" with fluoxetine,  has had similar feelings with Lexapro in the past. She's failed Zoloft, Paxil, Lexapro, Effexor, Wellbutrin.  Discussed options for treatment, and given that she's not improving despite numerous medication attempts, we will send her to psychiatry for evaluation. Referral placed.  We also discussed one remaining treatment option which includes Cymbalta. Given that she's had some improvement, we may be more successful with Cymbalta.   Wean off of fluoxetine, start low dose Cymbalta. Follow up in 4-6 weeks.   Follow Up Instructions:  Cut your fluoxetine in half and take a half tablet daily for one week, then stop.  Start your duloxetine (Cymbalta) 20 mg once daily for anxiety and depression.  You will be contacted regarding your referral to psychiatry.  Please let us know if you have not been contacted within two weeks.   Schedule a follow up visit for 4-6 weeks.  It was a pleasure to see you today! [name], NP-C    I discussed the assessment and treatment plan with the patient. The patient was provided an opportunity to ask questions and all were answered. The patient agreed with the plan and demonstrated an understanding of the instructions.   The patient was advised to call back or seek an in-person evaluation if the symptoms worsen or if the condition fails to improve as anticipated.   [name], NP    Review of Systems  Cardiovascular: Negative for chest pain.  Gastrointestinal: Negative for nausea.  Neurological: Negative for headaches.  Psychiatric/Behavioral:       See HPI  Past Medical History:  Diagnosis Date  . Acne   . Anxiety   . Depression   . Kidney stone      Social History   Socioeconomic History  . Marital status: Divorced    Spouse name: Not on file  . Number of children: Not on file  . Years of education: Not on file  . Highest education level: Not on file  Occupational History  . Not on file  Tobacco Use  . Smoking status:  Never Smoker  . Smokeless tobacco: Never Used  Substance and Sexual Activity  . Alcohol use: Yes    Alcohol/week: 0.0 standard drinks    Comment: social  . Drug use: No  . Sexual activity: Not on file  Other Topics Concern  . 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      Social Determinants of Health   Financial Resource Strain:   . Difficulty of Paying Living Expenses:   Food Insecurity:   . Worried About Programme researcher, broadcasting/film/video in the Last Year:   . Barista in the Last Year:   Transportation Needs:   . Freight forwarder (Medical):   Marland Kitchen Lack of Transportation (Non-Medical):   Physical Activity:   . Days of Exercise per Week:   . Minutes of Exercise per Session:   Stress:   . Feeling of Stress :   Social Connections:   . Frequency of Communication with Friends and Family:   . Frequency of Social Gatherings with Friends and Family:   . Attends Religious Services:   . Active Member of Clubs or Organizations:   . Attends Banker Meetings:   Marland Kitchen Marital Status:   Intimate Partner Violence:   . Fear of Current or Ex-Partner:   . Emotionally Abused:   Marland Kitchen Physically Abused:   . Sexually Abused:     Past Surgical History:  Procedure Laterality Date  . CYSTOSCOPY W/ URETERAL STENT PLACEMENT Right 02/14/2016   Procedure: CYSTOSCOPY WITH STENT EXCHANGE;  Surgeon: Hildred Laser, MD;  Location: [facility];  Service: Urology;  Laterality: Right;  . CYSTOSCOPY WITH STENT PLACEMENT Right 01/14/2016   Procedure: RIGHT CYSTOSCOPY, RIGHT RETROGRADE, RIGHT URETERAL STENT PLACEMENT;  Surgeon: Crist Fat, MD;  Location: [facility];  Service: Urology;  Laterality: Right;  . HOLMIUM LASER APPLICATION Right 02/14/2016   Procedure: HOLMIUM LASER APPLICATION;  Surgeon: Hildred Laser, MD;  Location: [facility];  Service: Urology;  Laterality: Right;  . URETEROSCOPY WITH  HOLMIUM LASER LITHOTRIPSY Right 02/14/2016   Procedure: URETEROSCOPY WITH HOLMIUM LASER LITHOTRIPSY;  Surgeon: Hildred Laser, MD;  Location: [facility];  Service: Urology;  Laterality: Right;    Family History  Problem Relation Age of Onset  . Hypertension Father   . Arthritis Father   . Heart disease Father   . Stroke Mother   . Seizures Mother   . Dementia Mother   . Cancer Mother        Cervical?  . Nephrolithiasis Mother   . Hematuria Mother     Allergies  Allergen Reactions  . Tramadol Nausea Only    Current Outpatient Medications on File Prior to Visit  Medication Sig Dispense Refill  . Biotin 10 MG TABS      No current facility-administered medications on file prior to visit.    LMP 04/09/2020  Objective:   Physical Exam Pulmonary:     Effort: Pulmonary effort is normal.  Neurological:     Mental Status: She is alert and oriented to person, place, and time.     Comments: Cognition seems slower today, difficulty expressing her feelings.   Psychiatric:        Mood and Affect: Mood normal.            Assessment & Plan:

## 2020-05-02 NOTE — Patient Instructions (Signed)
Cut your fluoxetine in half and take a half tablet daily for one week, then stop.  Start your duloxetine (Cymbalta) 20 mg once daily for anxiety and depression.  You will be contacted regarding your referral to psychiatry.  Please let us know if you have not been contacted within two weeks.   Schedule a follow up visit for 4-6 weeks.  It was a pleasure to see you today! [name], NP-C

## 2020-05-02 NOTE — Assessment & Plan Note (Signed)
Feeling too "numb" with fluoxetine, has had similar feelings with Lexapro in the past. She's failed Zoloft, Paxil, Lexapro, Effexor, Wellbutrin.  Discussed options for treatment, and given that she's not improving despite numerous medication attempts, we will send her to psychiatry for evaluation. Referral placed.  We also discussed one remaining treatment option which includes Cymbalta. Given that she's had some improvement, we may be more successful with Cymbalta.   Wean off of fluoxetine, start low dose Cymbalta. Follow up in 4-6 weeks.

## 2020-05-21 ENCOUNTER — Other Ambulatory Visit: Payer: Self-pay | Admitting: Primary Care

## 2020-05-21 DIAGNOSIS — F32A Depression, unspecified: Secondary | ICD-10-CM

## 2020-05-21 DIAGNOSIS — F419 Anxiety disorder, unspecified: Secondary | ICD-10-CM

## 2020-05-26 MED ORDER — DULOXETINE HCL 20 MG PO CPEP: 40.0000 mg | ORAL_CAPSULE | Freq: Every day | ORAL | 0 refills | Status: DC

## 2020-06-07 MED ORDER — DULOXETINE HCL 20 MG PO CPEP: 20.0000 mg | ORAL_CAPSULE | Freq: Every day | ORAL | 0 refills | Status: DC

## 2020-08-09 ENCOUNTER — Encounter: Payer: Self-pay | Admitting: Psychiatry

## 2020-08-09 ENCOUNTER — Telehealth (INDEPENDENT_AMBULATORY_CARE_PROVIDER_SITE_OTHER): Payer: BC Managed Care – PPO | Admitting: Psychiatry

## 2020-08-09 ENCOUNTER — Other Ambulatory Visit: Payer: Self-pay

## 2020-08-09 DIAGNOSIS — F411 Generalized anxiety disorder: Secondary | ICD-10-CM | POA: Diagnosis not present

## 2020-08-09 DIAGNOSIS — Z9189 Other specified personal risk factors, not elsewhere classified: Secondary | ICD-10-CM | POA: Diagnosis not present

## 2020-08-09 DIAGNOSIS — F3181 Bipolar II disorder: Secondary | ICD-10-CM | POA: Diagnosis not present

## 2020-08-09 MED ORDER — HYDROXYZINE HCL 25 MG PO TABS: 25.0000 mg | ORAL_TABLET | Freq: Every evening | ORAL | 0 refills | Status: DC | PRN

## 2020-08-09 MED ORDER — ARIPIPRAZOLE 2 MG PO TABS: 2.0000 mg | ORAL_TABLET | Freq: Every day | ORAL | 1 refills | Status: DC

## 2020-08-09 NOTE — Patient Instructions (Addendum)
Aripiprazole tablets What is this medicine? ARIPIPRAZOLE (ay ri PIP ray zole) is an atypical antipsychotic. It is used to treat schizophrenia and bipolar disorder, also known as manic-depression. It is also used to treat Tourette's disorder and some symptoms of autism. This medicine may also be used in combination with antidepressants to treat major depressive disorder. This medicine may be used for other purposes; ask your health care provider or pharmacist if you have questions. COMMON BRAND NAME(S): Abilify What should I tell my health care provider before I take this medicine? They need to know if you have any of these conditions:  dehydration  dementia  diabetes  heart disease  history of stroke  low blood counts, like low white cell, platelet, or red cell counts  Parkinson's disease  seizures  suicidal thoughts, plans, or attempt; a previous suicide attempt by you or a family member  an unusual or allergic reaction to aripiprazole, other medicines, foods, dyes, or preservatives  pregnant or trying to get pregnant  breast-feeding How should I use this medicine? Take this medicine by mouth with a glass of water. Follow the directions on the prescription label. You can take this medicine with or without food. Take your doses at regular intervals. Do not take your medicine more often than directed. Do not stop taking except on the advice of your doctor or health care professional. A special MedGuide will be given to you by the pharmacist with each prescription and refill. Be sure to read this information carefully each time. Talk to your pediatrician regarding the use of this medicine in children. While this drug may be prescribed for children as young as 6 years of age for selected conditions, precautions do apply. Overdosage: If you think you have taken too much of this medicine contact a poison control center or emergency room at once. NOTE: This medicine is only for you. Do  not share this medicine with others. What if I miss a dose? If you miss a dose, take it as soon as you can. If it is almost time for your next dose, take only that dose. Do not take double or extra doses. What may interact with this medicine? Do not take this medicine with any of the following medications:  brexpiprazole  cisapride  dronedarone  metoclopramide  pimozide  thioridazine This medicine may also interact with the following medications:  alcohol  carbamazepine  certain medicines for anxiety or sleep  certain medicines for blood pressure  certain medicines for fungal infections like ketoconazole, fluconazole, posaconazole, and itraconazole  clarithromycin  dofetilide  fluoxetine  other medicines that prolong the QT interval (cause an abnormal heart rhythm)  paroxetine  quinidine  rifampin  ziprasidone This list may not describe all possible interactions. Give your health care provider a list of all the medicines, herbs, non-prescription drugs, or dietary supplements you use. Also tell them if you smoke, drink alcohol, or use illegal drugs. Some items may interact with your medicine. What should I watch for while using this medicine? Visit your health care professional for regular checks on your progress. Tell your health care professional if symptoms do not start to get better or if they get worse. Do not stop taking except on your health care professional's advice. You may develop a severe reaction. Your health care professional will tell you how much medicine to take. Patients and their families should watch out for new or worsening depression or thoughts of suicide. Also watch out for sudden changes in feelings   such as feeling anxious, agitated, panicky, irritable, hostile, aggressive, impulsive, severely restless, overly excited and hyperactive, or not being able to sleep. If this happens, especially at the beginning of antidepressant treatment or after a  change in dose, call your health care professional. You may get dizzy or drowsy. Do not drive, use machinery, or do anything that needs mental alertness until you know how this medicine affects you. Do not stand or sit up quickly, especially if you are an older patient. This reduces the risk of dizzy or fainting spells. Alcohol may interfere with the effect of this medicine. Avoid alcoholic drinks. This drug can cause problems with controlling your body temperature. It can lower the response of your body to cold temperatures. If possible, stay indoors during cold weather. If you must go outdoors, wear warm clothes. It can also lower the response of your body to heat. Do not overheat. Do not over-exercise. Stay out of the sun when possible. If you must be in the sun, wear cool clothing. Drink plenty of water. If you have trouble controlling your body temperature, call your health care provider right away. This medicine may cause dry eyes and blurred vision. If you wear contact lenses, you may feel some discomfort. Lubricating drops may help. See your eye doctor if the problem does not go away or is severe. This medicine may increase blood sugar. Ask your health care provider if changes in diet or medicines are needed if you have diabetes. There are have been reports of increased sexual urges or other strong urges such as gambling while taking this medicine. If you experience any of these while taking this medicine, you should report this to your health care professional as soon as possible. What side effects may I notice from receiving this medicine? Side effects that you should report to your doctor or health care professional as soon as possible:  allergic reactions like skin rash, itching or hives, swelling of the face, lips, or tongue  breathing problems  confusion  fast, irregular heartbeat  fever or chills, sore throat  inability to keep still  males: prolonged or painful erection  new or  increased gambling urges, sexual urges, uncontrolled spending, binge or compulsive eating, or other urges  problems with balance, talking, walking  seizures  signs and symptoms of high blood sugar such as being more thirsty or hungry or having to urinate more than normal. You may also feel very tired or have blurry vision  signs and symptoms of low blood pressure like dizziness; feeling faint or lightheaded, falls; unusually weak or tired  signs and symptoms of neuroleptic malignant syndrome like confusion; fast or irregular heartbeat; high fever; increased sweating; stiff muscles  sudden numbness or weakness of the face, arm, or leg  suicidal thoughts or other mood changes  trouble swallowing  uncontrollable movements of the arms, face, head, mouth, neck, or upper body Side effects that usually do not require medical attention (report to your doctor or health care professional if they continue or are bothersome):  constipation  headache  nausea, vomiting  trouble sleeping  weight gain This list may not describe all possible side effects. Call your doctor for medical advice about side effects. You may report side effects to FDA at 1-800-FDA-1088. Where should I keep my medicine? Keep out of the reach of children. Store at room temperature between 15 and 30 degrees C (59 and 86 degrees F). Throw away any unused medicine after the expiration date. NOTE: This sheet   is a summary. It may not cover all possible information. If you have questions about this medicine, talk to your doctor, pharmacist, or health care provider.  2020 [employer] Standard (2019-08-17 16:17:22) Hydroxyzine capsules or tablets What is this medicine? HYDROXYZINE (hye DROX i zeen) is an antihistamine. This medicine is used to treat allergy symptoms. It is also used to treat anxiety and tension. This medicine can be used with other medicines to induce sleep before surgery. This medicine may be used for other  purposes; ask your health care provider or pharmacist if you have questions. COMMON BRAND NAME(S): ANX, Atarax, Rezine, Vistaril What should I tell my health care provider before I take this medicine? They need to know if you have any of these conditions:  glaucoma  heart disease  history of irregular heartbeat  kidney disease  liver disease  lung or breathing disease, like asthma  stomach or intestine problems  thyroid disease  trouble passing urine  an unusual or allergic reaction to hydroxyzine, cetirizine, other medicines, foods, dyes or preservatives  pregnant or trying to get pregnant  breast-feeding How should I use this medicine? Take this medicine by mouth with a full glass of water. Follow the directions on the prescription label. You may take this medicine with food or on an empty stomach. Take your medicine at regular intervals. Do not take your medicine more often than directed. Talk to your pediatrician regarding the use of this medicine in children. Special care may be needed. While this drug may be prescribed for children as young as 49 years of age for selected conditions, precautions do apply. Patients over 63 years old may have a stronger reaction and need a smaller dose. Overdosage: If you think you have taken too much of this medicine contact a poison control center or emergency room at once. NOTE: This medicine is only for you. Do not share this medicine with others. What if I miss a dose? If you miss a dose, take it as soon as you can. If it is almost time for your next dose, take only that dose. Do not take double or extra doses. What may interact with this medicine? Do not take this medicine with any of the following medications:  cisapride  dronedarone  pimozide  thioridazine This medicine may also interact with the following medications:  alcohol  antihistamines for allergy, cough, and cold  atropine  barbiturate medicines for sleep or  seizures, like phenobarbital  certain antibiotics like erythromycin or clarithromycin  certain medicines for anxiety or sleep  certain medicines for bladder problems like oxybutynin, tolterodine  certain medicines for depression or psychotic disturbances  certain medicines for irregular heart beat  certain medicines for Parkinson's disease like benztropine, trihexyphenidyl  certain medicines for seizures like phenobarbital, primidone  certain medicines for stomach problems like dicyclomine, hyoscyamine  certain medicines for travel sickness like scopolamine  ipratropium  narcotic medicines for pain  other medicines that prolong the QT interval (an abnormal heart rhythm) like dofetilide This list may not describe all possible interactions. Give your health care provider a list of all the medicines, herbs, non-prescription drugs, or dietary supplements you use. Also tell them if you smoke, drink alcohol, or use illegal drugs. Some items may interact with your medicine. What should I watch for while using this medicine? Tell your doctor or health care professional if your symptoms do not improve. You may get drowsy or dizzy. Do not drive, use machinery, or do anything that needs mental alertness  until you know how this medicine affects you. Do not stand or sit up quickly, especially if you are an older patient. This reduces the risk of dizzy or fainting spells. Alcohol may interfere with the effect of this medicine. Avoid alcoholic drinks. Your mouth may get dry. Chewing sugarless gum or sucking hard candy, and drinking plenty of water may help. Contact your doctor if the problem does not go away or is severe. This medicine may cause dry eyes and blurred vision. If you wear contact lenses you may feel some discomfort. Lubricating drops may help. See your eye doctor if the problem does not go away or is severe. If you are receiving skin tests for allergies, tell your doctor you are using  this medicine. What side effects may I notice from receiving this medicine? Side effects that you should report to your doctor or health care professional as soon as possible:  allergic reactions like skin rash, itching or hives, swelling of the face, lips, or tongue  changes in vision  confusion  fast, irregular heartbeat  seizures  tremor  trouble passing urine or change in the amount of urine Side effects that usually do not require medical attention (report to your doctor or health care professional if they continue or are bothersome):  constipation  drowsiness  dry mouth  headache  tiredness This list may not describe all possible side effects. Call your doctor for medical advice about side effects. You may report side effects to FDA at 1-800-FDA-1088. Where should I keep my medicine? Keep out of the reach of children. Store at room temperature between 15 and 30 degrees C (59 and 86 degrees F). Keep container tightly closed. Throw away any unused medicine after the expiration date. NOTE: This sheet is a summary. It may not cover all possible information. If you have questions about this medicine, talk to your doctor, pharmacist, or health care provider.  2020 [employer] Standard (2018-10-12 13:19:55)

## 2020-08-09 NOTE — Progress Notes (Signed)
Provider Location : [facility] Patient Location : Home  Participants: Patient , Provider  Virtual Visit via Video Note  I connected with Molly Miller on 08/09/20 at 11:00 AM EDT by a video enabled telemedicine application and verified that I am speaking with the correct person using two identifiers.   I discussed the limitations of evaluation and management by telemedicine and the availability of in person appointments. The patient expressed understanding and agreed to proceed.    I discussed the assessment and treatment plan with the patient. The patient was provided an opportunity to ask questions and all were answered. The patient agreed with the plan and demonstrated an understanding of the instructions.   The patient was advised to call back or seek an in-person evaluation if the symptoms worsen or if the condition fails to improve as anticipated.    Psychiatric Initial Adult Assessment   Patient Identification: Molly Miller MRN:  [id number] Date of Evaluation:  08/09/2020 Referral Source: Vernona Rieger NP  Chief Complaint:   Chief Complaint    Establish Care     Visit Diagnosis:    ICD-10-CM   1. Bipolar 2 disorder (HCC) Active F31.81 ARIPiprazole (ABILIFY) 2 MG tablet    hydrOXYzine (ATARAX/VISTARIL) 25 MG tablet   mixed, mild  2. GAD (generalized anxiety disorder)  F41.1 hydrOXYzine (ATARAX/VISTARIL) 25 MG tablet    TSH  3. At risk for prolonged QT interval syndrome  Z91.89 EKG 12-Lead    History of Present Illness:  Molly Miller is a 39 year old Caucasian female, divorced, currently lives with her boyfriend in [place], employed, has a history of depression, anxiety, was evaluated by telemedicine today.  Patient reports she has been struggling with anxiety and depression since the past several years.  She reports her depression started getting worse after the death of her mother which was 5 years ago.  She reports her mother had medical problems.  Patient  also reports that she went through psychosocial stressors of her boyfriend going through medical problems.  That also contributed to worsening depression and anxiety symptoms.  She currently struggles more with depression as well as mood swings.  She reports sadness, lack of energy, low motivation and sleep problems often.  She also reports she goes through episodes of at least 1 day during the week when she feels very energetic, has a lot of racing thoughts, has to keep moving or doing things to keep herself occupied, does a lot of cleaning during those days, reports sleep problems during those days and so on.  She denies any spending sprees because she knows she has no money to spend that way.  She reports if she is able to get herself to sleep at some point those nights she will be able to get up the next day and function okay.  This has been going on since the past several years.  Patient reports she also struggles with anxiety, feeling nervous, inability to relax, worrying about different things.  This has been getting worse since the past several years.  However since starting Cymbalta by her primary care provider she reports her anxiety symptoms have improved.  Her Cymbalta was increased to 40 mg recently however she reports that it made her more sluggish and she did not like the effect and hence went back to the 20 mg.  She is currently taking the 20 mg which helps to some extent.  Patient does report she struggles with attention and focus and has always felt that  she may have ADHD however has never been formally diagnosed.  She does report she is all over the place often when she has to work.  She reports her attention and focus symptoms is getting worse since the past several months.  Patient denies any suicidality, homicidality or perceptual disturbances.  Patient denies any substance abuse problems.  She does report increased appetite, stress eating when she feels she is in any kind of  situational stresses.  This has been getting worse since the past several months.  Patient denies any other concerns today.   Associated Signs/Symptoms: Depression Symptoms:  depressed mood, insomnia, psychomotor agitation, psychomotor retardation, difficulty concentrating, anxiety, disturbed sleep, increased appetite, (Hypo) Manic Symptoms:  Distractibility, Elevated Mood, Impulsivity, Irritable Mood, Labiality of Mood, Anxiety Symptoms:  Excessive Worry, Psychotic Symptoms:  Denies PTSD Symptoms: Had a traumatic exposure:  as noted above  Past Psychiatric History: Patient denies inpatient mental health admissions.  She was diagnosed with anxiety and depression by her primary care provider.  She denies suicide attempts.  She has tried and failed multiple medications including venlafaxine, Prozac, Wellbutrin,, Lexapro, higher dosage of Cymbalta.  Previous Psychotropic Medications: Yes As noted above  Substance Abuse History in the last 12 months:  No.  Consequences of Substance Abuse: Negative  Past Medical History:  Past Medical History:  Diagnosis Date   Acne    Anxiety    Depression    Kidney stone     Past Surgical History:  Procedure Laterality Date   CYSTOSCOPY W/ URETERAL STENT PLACEMENT Right 02/14/2016   Procedure: CYSTOSCOPY WITH STENT EXCHANGE;  Surgeon: Hildred Laser, MD;  Location: [facility];  Service: Urology;  Laterality: Right;   CYSTOSCOPY WITH STENT PLACEMENT Right 01/14/2016   Procedure: RIGHT CYSTOSCOPY, RIGHT RETROGRADE, RIGHT URETERAL STENT PLACEMENT;  Surgeon: Crist Fat, MD;  Location: [facility];  Service: Urology;  Laterality: Right;   HOLMIUM LASER APPLICATION Right 02/14/2016   Procedure: HOLMIUM LASER APPLICATION;  Surgeon: Hildred Laser, MD;  Location: [facility];  Service: Urology;  Laterality: Right;   URETEROSCOPY WITH HOLMIUM LASER LITHOTRIPSY Right 02/14/2016   Procedure: URETEROSCOPY WITH HOLMIUM LASER LITHOTRIPSY;   Surgeon: Hildred Laser, MD;  Location: [facility];  Service: Urology;  Laterality: Right;    Family Psychiatric History: Mother-depression, dementia, sister-anxiety, depression  Family History:  Family History  Problem Relation Age of Onset   Stroke Mother    Seizures Mother    Dementia Mother    Cancer Mother        Cervical?   Nephrolithiasis Mother    Hematuria Mother    Depression Mother    Hypertension Father    Arthritis Father    Heart disease Father    Depression Sister     Social History:   Social History   Socioeconomic History   Marital status: Divorced    Spouse name: Not on file   Number of children: Not on file   Years of education: Not on file   Highest education level: Not on file  Occupational History   Not on file  Tobacco Use   Smoking status: Former Smoker    Quit date: 04/05/2020    Years since quitting: 0.3   Smokeless tobacco: Never Used   Tobacco comment: june 2021  Vaping Use   Vaping Use: Every day   Substances: Nicotine  Substance and Sexual Activity   Alcohol use: Yes    Alcohol/week: 0.0 standard drinks    Comment: social  Drug use: No   Sexual activity: Not on file  Other Topics Concern   Not on file  Social History Narrative   Single   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      Social Determinants of Health   Financial Resource Strain:    Difficulty of Paying Living Expenses: Not on file  Food Insecurity:    Worried About Programme researcher, broadcasting/film/video in the Last Year: Not on file   [employer] of Food in the Last Year: Not on file  Transportation Needs:    Lack of Transportation (Medical): Not on file   Lack of Transportation (Non-Medical): Not on file  Physical Activity:    Days of Exercise per Week: Not on file   Minutes of Exercise per Session: Not on file  Stress:    Feeling of Stress : Not on file  Social Connections:    Frequency of  Communication with Friends and Family: Not on file   Frequency of Social Gatherings with Friends and Family: Not on file   Attends Religious Services: Not on file   Active Member of Clubs or Organizations: Not on file   Attends Banker Meetings: Not on file   Marital Status: Not on file    Additional Social History: Patient reports she was raised by her mother initially.  She reports she left her mother at the age of 49 and then stayed with her dad for a year.  She reports after that she left her dad and was on by herself at the age of 34.  She graduated high school.  She has been married twice, divorced twice.  She denies having any biological children of her own.  She currently lives in [place] with her boyfriend .  Her boyfriend's daughter and grandson lives with them.  Patient currently is employed at an [employer], community in [place].  Allergies:   Allergies  Allergen Reactions   Tramadol Nausea Only    Metabolic Disorder Labs: Lab Results  Component Value Date   HGBA1C 5.6 06/02/2019   No results found for: PROLACTIN Lab Results  Component Value Date   CHOL 193 10/16/2018   TRIG 276.0 (H) 10/16/2018   HDL 31.80 (L) 10/16/2018   CHOLHDL 6 10/16/2018   VLDL 55.2 (H) 10/16/2018   Lab Results  Component Value Date   TSH 1.520 06/02/2019    Therapeutic Level Labs: No results found for: LITHIUM No results found for: CBMZ No results found for: VALPROATE  Current Medications: Current Outpatient Medications  Medication Sig Dispense Refill   Ascorbic Acid (VITAMIN C) 100 MG tablet Take 100 mg by mouth daily.     Bacillus Coagulans-Inulin (PROBIOTIC) 1-250 BILLION-MG CAPS Take by mouth.     Biotin 10 MG TABS      DULoxetine (CYMBALTA) 20 MG capsule Take 1 capsule (20 mg total) by mouth daily. For anxiety and depression. 90 capsule 0   Melatonin 5 MG CAPS Take by mouth.     ARIPiprazole (ABILIFY) 2 MG tablet Take 1 tablet (2 mg total) by mouth  daily. 30 tablet 1   hydrOXYzine (ATARAX/VISTARIL) 25 MG tablet Take 1-2 tablets (25-50 mg total) by mouth at bedtime as needed for anxiety. And sleep 30 tablet 0   No current facility-administered medications for this visit.    Musculoskeletal: Strength & Muscle Tone: UTA Gait & Station: normal Patient leans: N/A  Psychiatric Specialty Exam: Review of Systems  Psychiatric/Behavioral:  Positive for decreased concentration, dysphoric mood and sleep disturbance. The patient is nervous/anxious.   All other systems reviewed and are negative.   There were no vitals taken for this visit.There is no height or weight on file to calculate BMI.  General Appearance: Casual  Eye Contact:  Fair  Speech:  Normal Rate  Volume:  Normal  Mood:  Anxious, Depressed and Irritable  Affect:  Congruent  Thought Process:  Goal Directed and Descriptions of Associations: Intact  Orientation:  Full (Time, Place, and Person)  Thought Content:  Rumination  Suicidal Thoughts:  No  Homicidal Thoughts:  No  Memory:  Immediate;   Fair Recent;   Fair Remote;   Fair  Judgement:  Fair  Insight:  Fair  Psychomotor Activity:  Normal  Concentration:  Concentration: Fair and Attention Span: Fair  Recall:  [employer] of Knowledge:Fair  Language: Fair  Akathisia:  No  Handed:  Right  AIMS (if indicated):  UTA  Assets:  Communication Skills Desire for Improvement Housing Intimacy Social Support Talents/Skills Transportation Vocational/Educational  ADL's:  Intact  Cognition: WNL  Sleep:  Poor   Screenings: GAD-7     Video Visit from 08/09/2020 in [facility] Office Visit from 10/09/2017 in [facility]  Total GAD-7 Score 14 11    PHQ2-9     Video Visit from 08/09/2020 in [facility] Office Visit from 10/09/2017 in [facility] Visit from 03/30/2015 in [facility] Total  Score 6 4 4   PHQ-9 Total Score 23 19 20       Assessment and Plan: Molly Miller is a 39 year old Caucasian female, employed, divorced, lives in [place] with her boyfriend, has a history of depression, anxiety, mood lability was evaluated by telemedicine today.  Patient is biologically predisposed given her family history, her history of trauma as noted above.  Patient with psychosocial stressors of situational stressors.  Patient based on evaluation today as well as clinical presentation likely meets criteria for bipolar type II since she does have hypomanic episodes along with her depressive episodes.  She will benefit from the following plan.  Plan Bipolar type II mixed-unstable Start Abilify 2 mg p.o. daily. Continue Cymbalta 20 mg p.o. daily   GAD-in progress Cymbalta 20 mg p.o. daily.  She did not tolerate the higher dosage. Start hydroxyzine 25 to 50 mg p.o. nightly as needed for sleep. Discussed with patient drug to drug interaction between hydroxyzine, melatonin as well as Zyrtec.  She will monitor herself and limit use. Will refer for CBT.  Patient will benefit from the following labs-TSH.Order TSH.  Patient will benefit from EKG to monitor her QT.Will order EKG.  Follow-up in clinic in 3 to 4 weeks or sooner if needed.  I have spent atleast 60 minutes face to face by video with patient today. More than 50 % of the time was spent for preparing to see the patient ( e.g., review of test, records ), obtaining and to review and separately obtained history , ordering medications and test ,psychoeducation and supportive psychotherapy and care coordination,as well as documenting clinical information in electronic health record. This note was generated in part or whole with voice recognition software. Voice recognition is usually quite accurate but there are transcription errors that can and very often do occur. I apologize for any typographical errors that were not detected and  corrected.        Quade Ramirez  Elna BreslowEappen, MD 10/7/20218:16 AM

## 2020-08-21 ENCOUNTER — Ambulatory Visit: Payer: BC Managed Care – PPO | Admitting: Primary Care

## 2020-08-21 ENCOUNTER — Encounter: Payer: Self-pay | Admitting: Primary Care

## 2020-08-21 ENCOUNTER — Other Ambulatory Visit: Payer: Self-pay

## 2020-08-21 VITALS — BP 136/72 | HR 82 | Temp 97.9°F | Ht 69.0 in | Wt 272.0 lb

## 2020-08-21 DIAGNOSIS — Z79899 Other long term (current) drug therapy: Secondary | ICD-10-CM

## 2020-08-21 DIAGNOSIS — F3181 Bipolar II disorder: Secondary | ICD-10-CM

## 2020-08-21 DIAGNOSIS — Z9189 Other specified personal risk factors, not elsewhere classified: Secondary | ICD-10-CM

## 2020-08-21 LAB — COMPREHENSIVE METABOLIC PANEL
ALT: 36 U/L — ABNORMAL HIGH (ref 0–35)
AST: 21 U/L (ref 0–37)
Albumin: 4 g/dL (ref 3.5–5.2)
Alkaline Phosphatase: 74 U/L (ref 39–117)
BUN: 16 mg/dL (ref 6–23)
CO2: 27 mEq/L (ref 19–32)
Calcium: 8.9 mg/dL (ref 8.4–10.5)
Chloride: 104 mEq/L (ref 96–112)
Creatinine, Ser: 0.65 mg/dL (ref 0.40–1.20)
GFR: 111.41 mL/min (ref >60.00)
Glucose, Bld: 88 mg/dL (ref 70–99)
Potassium: 4.2 mEq/L (ref 3.5–5.1)
Sodium: 138 mEq/L (ref 135–145)
Total Bilirubin: 0.6 mg/dL (ref 0.2–1.2)
Total Protein: 6.2 g/dL (ref 6.0–8.3)

## 2020-08-21 LAB — CBC
HCT: 41.1 % (ref 36.0–46.0)
Hemoglobin: 13.8 g/dL (ref 12.0–15.0)
MCHC: 33.7 g/dL (ref 30.0–36.0)
MCV: 84.3 fl (ref 78.0–100.0)
Platelets: 233 10*3/uL (ref 150.0–400.0)
RBC: 4.88 Mil/uL (ref 3.87–5.11)
RDW: 13.1 % (ref 11.5–15.5)
WBC: 7.5 10*3/uL (ref 4.0–10.5)

## 2020-08-21 LAB — LIPID PANEL
Cholesterol: 166 mg/dL (ref 0–200)
HDL: 28.6 mg/dL — ABNORMAL LOW (ref >39.00)
NonHDL: 137.54
Total CHOL/HDL Ratio: 6
Triglycerides: 307 mg/dL — ABNORMAL HIGH (ref 0.0–149.0)
VLDL: 61.4 mg/dL — ABNORMAL HIGH (ref 0.0–40.0)

## 2020-08-21 LAB — TSH: TSH: 1.7 u[IU]/mL (ref 0.35–4.50)

## 2020-08-21 LAB — LDL CHOLESTEROL, DIRECT: Direct LDL: 101 mg/dL

## 2020-08-21 LAB — HEMOGLOBIN A1C: Hgb A1c MFr Bld: 5.8 % (ref 4.6–6.5)

## 2020-08-21 NOTE — Assessment & Plan Note (Addendum)
Slightly improved with Ability 2 mg, less variations in mood swings. Continue both Ability 2 mg and Cymbalta 20 mg HS.   She will try the hydroxyzine 25 mg PRN HS.   ECG completed, will send to psychiatrist. TSH pending.

## 2020-08-21 NOTE — Patient Instructions (Signed)
Stop by the lab prior to leaving today. I will notify you of your results once received.   It was a pleasure to see you today!  

## 2020-08-21 NOTE — Assessment & Plan Note (Signed)
Now on Abilify per psychiatry, ECG completed. Will send results to Dr. Elna Breslow. TSH pending.

## 2020-08-21 NOTE — Assessment & Plan Note (Signed)
Checking labs today including TSH, CBC, A1C, CMP.  Discussed the importance of a healthy diet and regular exercise in order for weight loss, and to reduce the risk of any potential medical problems.

## 2020-08-21 NOTE — Progress Notes (Signed)
Subjective:    Patient ID: Molly Miller, female    DOB: 1981-09-07, 39 y.o.   MRN: [id number]  HPI  This visit occurred during the SARS-CoV-2 public health emergency.  Safety protocols were in place, including screening questions prior to the visit, additional usage of staff PPE, and extensive cleaning of exam room while observing appropriate contact time as indicated for disinfecting solutions.   Ms. Degroote is a 39 year old female with a history of anxiety and depression, Bipolar 2 Disorder who presents today for labs and ECG at the request of her psychiatrist.  Long history of anxiety and depression symptoms, has failed numerous medications for such treatment. Now seeing psychiatry who has her managed on Abilify 2 mg daily along with Cymbalta 20 mg. She had her initial visit with Dr. Elna Breslow through a video visit on 08/09/20. Her psychiatrist recently prescribed hydroxyzine 25 mg to take HS for difficulty sleeping, she has not yet tried this as she's concerned as she's taking Zyrtec.  She has an appointment scheduled with her therapist this week, this will be her first visit.  She is needing a TSH and ECG.   BP Readings from Last 3 Encounters:  08/21/20 136/72  03/01/20 134/74  06/02/19 139/72   Wt Readings from Last 3 Encounters:  08/21/20 272 lb (123.4 kg)  03/01/20 262 lb (118.8 kg)  06/02/19 264 lb (119.7 kg)     Review of Systems  Respiratory: Negative for shortness of breath.   Cardiovascular: Negative for chest pain.  Neurological: Negative for dizziness and headaches.  Psychiatric/Behavioral: The patient is nervous/anxious.        Past Medical History:  Diagnosis Date  . Acne   . Anxiety   . Depression   . Kidney stone      Social History   Socioeconomic History  . Marital status: Divorced    Spouse name: Not on file  . Number of children: Not on file  . Years of education: Not on file  . Highest education level: Not on file  Occupational History  .  Not on file  Tobacco Use  . Smoking status: Former Smoker    Quit date: 04/05/2020    Years since quitting: 0.3  . Smokeless tobacco: Never Used  . Tobacco comment: june 2021  Vaping Use  . Vaping Use: Every day  . Substances: Nicotine  Substance and Sexual Activity  . Alcohol use: Yes    Alcohol/week: 0.0 standard drinks    Comment: social  . Drug use: No  . Sexual activity: Not on file  Other Topics Concern  . 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      Social Determinants of Health   Financial Resource Strain:   . Difficulty of Paying Living Expenses: Not on file  Food Insecurity:   . Worried About Programme researcher, broadcasting/film/video in the Last Year: Not on file  . Ran Out of Food in the Last Year: Not on file  Transportation Needs:   . Lack of Transportation (Medical): Not on file  . Lack of Transportation (Non-Medical): Not on file  Physical Activity:   . Days of Exercise per Week: Not on file  . Minutes of Exercise per Session: Not on file  Stress:   . Feeling of Stress : Not on file  Social Connections:   . Frequency of Communication with Friends  and Family: Not on file  . Frequency of Social Gatherings with Friends and Family: Not on file  . Attends Religious Services: Not on file  . Active Member of Clubs or Organizations: Not on file  . Attends Banker Meetings: Not on file  . Marital Status: Not on file  Intimate Partner Violence:   . Fear of Current or Ex-Partner: Not on file  . Emotionally Abused: Not on file  . Physically Abused: Not on file  . Sexually Abused: Not on file    Past Surgical History:  Procedure Laterality Date  . CYSTOSCOPY W/ URETERAL STENT PLACEMENT Right 02/14/2016   Procedure: CYSTOSCOPY WITH STENT EXCHANGE;  Surgeon: Hildred Laser, MD;  Location: [facility];  Service: Urology;  Laterality: Right;  . CYSTOSCOPY WITH STENT PLACEMENT Right  01/14/2016   Procedure: RIGHT CYSTOSCOPY, RIGHT RETROGRADE, RIGHT URETERAL STENT PLACEMENT;  Surgeon: Crist Fat, MD;  Location: [facility];  Service: Urology;  Laterality: Right;  . HOLMIUM LASER APPLICATION Right 02/14/2016   Procedure: HOLMIUM LASER APPLICATION;  Surgeon: Hildred Laser, MD;  Location: [facility];  Service: Urology;  Laterality: Right;  . URETEROSCOPY WITH HOLMIUM LASER LITHOTRIPSY Right 02/14/2016   Procedure: URETEROSCOPY WITH HOLMIUM LASER LITHOTRIPSY;  Surgeon: Hildred Laser, MD;  Location: [facility];  Service: Urology;  Laterality: Right;    Family History  Problem Relation Age of Onset  . Stroke Mother   . Seizures Mother   . Dementia Mother   . Cancer Mother        Cervical?  . Nephrolithiasis Mother   . Hematuria Mother   . Depression Mother   . Hypertension Father   . Arthritis Father   . Heart disease Father   . Depression Sister     Allergies  Allergen Reactions  . Tramadol Nausea Only    Current Outpatient Medications on File Prior to Visit  Medication Sig Dispense Refill  . ARIPiprazole (ABILIFY) 2 MG tablet Take 1 tablet (2 mg total) by mouth daily. 30 tablet 1  . Ascorbic Acid (VITAMIN C) 100 MG tablet Take 100 mg by mouth daily.    . Bacillus Coagulans-Inulin (PROBIOTIC) 1-250 BILLION-MG CAPS Take by mouth.    . Biotin 10 MG TABS     . cetirizine (ZYRTEC) 10 MG tablet Take 10 mg by mouth daily.    . DULoxetine (CYMBALTA) 20 MG capsule Take 1 capsule (20 mg total) by mouth daily. For anxiety and depression. 90 capsule 0  . hydrOXYzine (ATARAX/VISTARIL) 25 MG tablet Take 1-2 tablets (25-50 mg total) by mouth at bedtime as needed for anxiety. And sleep 30 tablet 0  . Melatonin 5 MG CAPS Take by mouth.     No current facility-administered medications on file prior to visit.    BP 136/72   Pulse 82   Temp 97.9 F (36.6 C) (Temporal)   Ht 5\' 9"  (1.753 m)   Wt 272 lb (123.4 kg)   SpO2 98%   BMI 40.17 kg/m    Objective:    Physical Exam Cardiovascular:     Rate and Rhythm: Normal rate and regular rhythm.  Pulmonary:     Effort: Pulmonary effort is normal.     Breath sounds: Normal breath sounds.  Musculoskeletal:     Cervical back: Neck supple.  Skin:    General: Skin is warm and dry.            Assessment & Plan:

## 2020-08-24 ENCOUNTER — Encounter: Payer: Self-pay | Admitting: Licensed Clinical Social Worker

## 2020-08-24 ENCOUNTER — Ambulatory Visit (INDEPENDENT_AMBULATORY_CARE_PROVIDER_SITE_OTHER): Payer: BC Managed Care – PPO | Admitting: Licensed Clinical Social Worker

## 2020-08-24 ENCOUNTER — Other Ambulatory Visit: Payer: Self-pay

## 2020-08-24 DIAGNOSIS — F3181 Bipolar II disorder: Secondary | ICD-10-CM

## 2020-08-24 DIAGNOSIS — F411 Generalized anxiety disorder: Secondary | ICD-10-CM

## 2020-08-25 NOTE — Progress Notes (Signed)
Virtual Visit via Video Note  I connected with Molly Miller Genet on 08/24/20 at  3:00 PM EDT by a video enabled telemedicine application and verified that I am speaking with the correct person using two identifiers.  Location: Patient: Worksite Provider: Home Office   I discussed the limitations of evaluation and management by telemedicine and the availability of in person appointments. The patient expressed understanding and agreed to proceed.  Comprehensive Clinical Assessment (CCA) Note  08/24/2020 Molly Miller [id number]  Visit Diagnosis:      ICD-10-CM   1. Bipolar 2 disorder (HCC)  F31.81   2. GAD (generalized anxiety disorder)  F41.1       CCA Screening, Triage and Referral (STR) STR has been completed on paper by the patient/patient's guardian.  (See scanned document in Chart Review)  CCA Biopsychosocial  Intake/Chief Complaint:  CCA Intake With Chief Complaint CCA Part Two Date: 08/24/20 CCA Part Two Time: 1500 Chief Complaint/Presenting Problem: Pt presents as a 39 year old Caucasian divorced female for assessment. Pt was referred by her psychiatrist and is seeking counseling for depression and anxiety. Pt reported "I want to learn how to put me first and not worry about everyone else". Patient's Currently Reported Symptoms/Problems: Anxiety, Sleep, Mood Lability, Family Stressors, Depression related to grief and loss of mother 5 years ago Individual's Strengths: Pt reported "my relationship with my sister". Individual's Preferences: Pt reported she was talking to a previous therapist, but "didn't feel like I got much from that". Individual's Abilities: Pt works full-time, is task oriented and is able to communicate what she wants from therapy. Type of Services Patient Feels Are Needed: Individual Therapy and Medication Management  Mental Health Symptoms Depression:  Depression: Duration of symptoms greater than two weeks, Change in energy/activity, Difficulty  Concentrating, Increase/decrease in appetite, Fatigue, Sleep (too much or little), Irritability  Mania:  Mania: Increased Energy, Change in energy/activity, Racing thoughts, Irritability  Anxiety:   Anxiety: Tension, Worrying, Restlessness, Irritability, Fatigue, Sleep, Difficulty concentrating  Psychosis:  Psychosis: None  Trauma:  Trauma: N/A (Pt did not wish to discuss for this assessment. Will monitor in sessions.)  Obsessions:  Obsessions: Good insight, Cause anxiety (Pt reported likes certain areas to be clean and tidy, "cannot leave dirty dishes in sink over night or won't be able to sleep".)  Compulsions:  Compulsions: Good insight, "Driven" to perform behaviors/acts  Inattention:  Inattention: Poor follow-through on tasks, Forgetful  Hyperactivity/Impulsivity:  Hyperactivity/Impulsivity: Always on the go, Feeling of restlessness, Fidgets with hands/feet, Hard time playing/leisure activities quietly  Oppositional/Defiant Behaviors:  Oppositional/Defiant Behaviors: Argumentative  Emotional Irregularity:  Emotional Irregularity: Mood lability  Other Mood/Personality Symptoms:  Other Mood/Personality Symptoms: Pt denied current/hx of SI/HI.   Mental Status Exam Appearance and self-care  Stature:  Stature: Tall  Weight:  Weight: Overweight  Clothing:  Clothing: Neat/clean  Grooming:  Grooming: Well-groomed  Cosmetic use:  Cosmetic Use: Age appropriate  Posture/gait:  Posture/Gait: Normal  Motor activity:  Motor Activity: Not Remarkable  Sensorium  Attention:  Attention: Distractible  Concentration:  Concentration: Preoccupied, Anxiety interferes  Orientation:  Orientation: X5  Recall/memory:  Recall/Memory: Normal  Affect and Mood  Affect:  Affect: Anxious  Mood:  Mood: Anxious  Relating  Eye contact:  Eye Contact: Normal  Facial expression:  Facial Expression: Responsive  Attitude toward examiner:  Attitude Toward Examiner: Cooperative  Thought and Language  Speech flow:  Speech Flow: Normal  Thought content:     Preoccupation:  Preoccupations: Obsessions, Ruminations  Hallucinations:  Hallucinations: None  Organization:     Company secretary of Knowledge:  Fund of Knowledge: Good  Intelligence:  Intelligence: Above Average  Abstraction:  Abstraction: Normal  Judgement:  Judgement: Good  Reality Testing:  Reality Testing: Adequate  Insight:  Insight: Good  Decision Making:  Decision Making: Normal  Social Functioning  Social Maturity:  Social Maturity: Responsible  Social Judgement:  Social Judgement: Normal  Stress  Stressors:  Stressors: Veterinary surgeon, Work, Family conflict, Relationship, Transitions  Coping Ability:  Coping Ability: Normal (conversations with family, try to put things out of my mind, baking)  Skill Deficits:  Skill Deficits: None  Supports:  Supports: Family, Friends/Service system     Religion: Religion/Spirituality Are You A Religious Person?: No  Leisure/Recreation: Leisure / Recreation Do You Have Hobbies?: Yes Leisure and Hobbies: Pt reported "anything craft wise, painting furniture, doing something with my hands, cooking".  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 Has patient ever been in the [employer]?: No  Education: Education Did Garment/textile technologist From [employer]?: Yes Did You Have An Individualized Education Program (IIEP): No Did You Have Any Difficulty At School?: No   CCA Family/Childhood History  Family and Relationship History: Family history Marital status: Long term relationship Long term relationship, how  long?: 10 years What types of issues is patient dealing with in the relationship?: Pt reported that SO's daughter and 27 year old grand child live with them. "We don't fight about that, but I feel most of the burden as a caregiver". Pt reported having a hard time not reacting to SO's anger or frustration and this adds to her own anxiety. Are you sexually active?: Yes Does patient have children?: No  Childhood History:  Childhood History By whom was/is the patient raised?: Mother Additional childhood history information: Pt reported she has been independent in caring for self since age 2. Description of patient's relationship with caregiver when they were a child: Pt reported often feeling like she was "pitted against" her sister by mother. Not a lot of contact with father. Patient's description of current relationship with people who raised him/her: Mother has been deceased for past 5 years. Relationship with father got better as I got older. We are okay. How were you disciplined when you got in trouble as a child/adolescent?: Pt reported "getting a look. It killed me to disappoint" her mother. Does patient have siblings?: Yes Number of Siblings: 3 Description of patient's current relationship with siblings: Pt reported closer now with half sister since mother got sick. Not close to other siblings "we had different moms". Did patient suffer any verbal/emotional/physical/sexual abuse as a child?:  (Pt did not wish to disclose at this time.) Did patient suffer from severe childhood neglect?:  (Pt did not wish to disclose at this time.) Has patient ever been sexually abused/assaulted/raped as an adolescent or adult?:  (Pt did not wish to disclose at this time.) Was the patient ever a victim of a crime or a disaster?:  (Pt did not wish to disclose at this time.) Witnessed domestic violence?:  (Pt did not wish to disclose at this time.) Has patient been affected by domestic violence as  an adult?:   (Pt did not wish to disclose at this time.)       CCA Substance Use  Alcohol/Drug Use: Alcohol / Drug Use History of alcohol / drug use?: No history of alcohol / drug abuse                          Recommendations for Services/Supports/Treatments: Recommendations for Services/Supports/Treatments Recommendations For Services/Supports/Treatments: Individual Therapy, Medication Management  DSM5 Diagnoses: Patient Active Problem List   Diagnosis Date Noted  . Bipolar 2 disorder (HCC) 08/09/2020  . GAD (generalized anxiety disorder) 08/09/2020  . At risk for prolonged QT interval syndrome 08/09/2020  . Encounter for initial prescription of contraceptives 03/12/2019  . Acute left ankle pain 01/27/2019  . Abdominal bloating 10/16/2018  . Preventative health care 10/16/2018  . Seasonal allergic rhinitis 10/09/2017  . Bilateral edema of lower extremity 04/29/2016  . Anxiety and depression 03/30/2015  . Morbid obesity (HCC) 03/30/2015    Patient Centered Plan: Patient is on the following Treatment Plan(s):  Anxiety   Follow Up Instructions:  I discussed the assessment and treatment plan with the patient. The patient was provided an opportunity to ask questions and all were answered. The patient agreed with the plan and demonstrated an understanding of the instructions.   The patient was advised to call back or seek an in-person evaluation if the symptoms worsen or if the condition fails to improve as anticipated.  I provided 45 minutes of non-face-to-face time during this encounter.   Shain Pauwels Arnette Felts, LCSW, LCAS

## 2020-09-07 ENCOUNTER — Ambulatory Visit (INDEPENDENT_AMBULATORY_CARE_PROVIDER_SITE_OTHER): Payer: BC Managed Care – PPO | Admitting: Licensed Clinical Social Worker

## 2020-09-07 ENCOUNTER — Encounter: Payer: Self-pay | Admitting: Licensed Clinical Social Worker

## 2020-09-07 ENCOUNTER — Other Ambulatory Visit: Payer: Self-pay

## 2020-09-07 DIAGNOSIS — F3181 Bipolar II disorder: Secondary | ICD-10-CM

## 2020-09-07 DIAGNOSIS — F411 Generalized anxiety disorder: Secondary | ICD-10-CM

## 2020-09-07 NOTE — Progress Notes (Signed)
Virtual Visit via Video Note  I connected with Molly Miller on 09/07/20 at  3:00 PM EDT by a video enabled telemedicine application and verified that I am speaking with the correct person using two identifiers.  Location: Patient: Vehicle Provider: Home Office   I discussed the limitations of evaluation and management by telemedicine and the availability of in person appointments. The patient expressed understanding and agreed to proceed.  THERAPY PROGRESS NOTE  Session Time: 34 Minutes  Participation Level: Active  Behavioral Response: Neat and Well GroomedAlertAnxious  Type of Therapy: Individual Therapy  Treatment Goals addressed: Anxiety, Communication: Setting Boundaries and Coping  Interventions: CBT  Summary: Molly Miller is a 39 y.o. female who presents with depression and anxiety sxs. Pt identified current stressors around relationship with significant other and communication style. Pt reported she would like to be able to say no without feeling guilty and make an effort to prioritize her own needs above the feelings of others. Pt acknowledged that this has been a pattern established from a young age when she was tasked with helping care for her mother who was recovering from an injury. Pt was receptive to therapist suggestions.  Suicidal/Homicidal: No   Therapist Response: Therapist met with patient for first session since completing CCA. Therapist and patient reviewed treatment plan and goals. Pt in agreement. Therapist and patient explored current stressors and attempts to cope. Therapist provided psychoeducation around setting boundaries using the fair fighting rules worksheet and encouraged patient to practice declining small requests. Pt was receptive.  Plan: Return again in 2 weeks.  Diagnosis: Axis I: Bipolar, Depressed and Generalized Anxiety Disorder    Axis II: N/A  Josephine Igo, LCSW, LCAS 09/07/2020

## 2020-09-11 ENCOUNTER — Encounter: Payer: Self-pay | Admitting: Psychiatry

## 2020-09-11 ENCOUNTER — Telehealth (INDEPENDENT_AMBULATORY_CARE_PROVIDER_SITE_OTHER): Payer: BC Managed Care – PPO | Admitting: Psychiatry

## 2020-09-11 ENCOUNTER — Other Ambulatory Visit: Payer: Self-pay

## 2020-09-11 DIAGNOSIS — F411 Generalized anxiety disorder: Secondary | ICD-10-CM

## 2020-09-11 DIAGNOSIS — G4701 Insomnia due to medical condition: Secondary | ICD-10-CM | POA: Diagnosis not present

## 2020-09-11 DIAGNOSIS — F3181 Bipolar II disorder: Secondary | ICD-10-CM

## 2020-09-11 MED ORDER — DULOXETINE HCL 20 MG PO CPEP: 20.0000 mg | ORAL_CAPSULE | Freq: Every day | ORAL | 2 refills | Status: DC

## 2020-09-11 MED ORDER — LAMOTRIGINE 25 MG PO TABS: 25.0000 mg | ORAL_TABLET | Freq: Every day | ORAL | 1 refills | Status: DC

## 2020-09-11 NOTE — Progress Notes (Signed)
Virtual Visit via Video Note  I connected with Molly Miller on 09/11/20 at 11:30 AM EST by a video enabled telemedicine application and verified that I am speaking with the correct person using two identifiers.  Location Provider Location : [facility] Patient Location : Work  Participants: Patient , Provider   I discussed the limitations of evaluation and management by telemedicine and the availability of in person appointments. The patient expressed understanding and agreed to proceed.    I discussed the assessment and treatment plan with the patient. The patient was provided an opportunity to ask questions and all were answered. The patient agreed with the plan and demonstrated an understanding of the instructions.   The patient was advised to call back or seek an in-person evaluation if the symptoms worsen or if the condition fails to improve as anticipated. BH MD OP Progress Note  09/11/2020 4:57 PM LOUIE MEADERS  MRN:  [id number]  Chief Complaint:  Chief Complaint    Follow-up     HPI: Molly Miller is a 39 year old Caucasian female, divorced, currently lives with her boyfriend in [place], has a history of bipolar disorder type II, GAD, was evaluated by telemedicine today.  Patient today reports she does not like the effect of Abilify.  She reports when she took it during the day made her sluggish and tired.  She also reports she hence started taking it at bedtime and that seems to not help her at all during the day.  She also reports she has increased appetite since being on the Abilify and is worried about gaining more weight.  She reports she continues to struggle with racing thoughts.  She also has mood swings, depressive symptoms.  She feels fatigued and tired during the day.  She struggles with low energy and lack of motivation.  Patient also continues to struggle with sleep.  She has difficulty falling asleep.  She reports she may get 4 to 5 hours of sleep at night  since she takes melatonin.  She is currently on 5 mg.  She also takes hydroxyzine which helps to some extent.  She quit using Zyrtec.  She has not had any trouble with allergies since stopping it.  She is currently in therapy with Ms. Juanito Doom and reports that helps.  Patient denies any suicidality, homicidality or perceptual disturbances.  Patient denies any other concerns today.  Visit Diagnosis:    ICD-10-CM   1. Bipolar 2 disorder (HCC)  F31.81 lamoTRIgine (LAMICTAL) 25 MG tablet   depressed, mild with mixed features  2. GAD (generalized anxiety disorder)  F41.1 DULoxetine (CYMBALTA) 20 MG capsule  3. Insomnia due to medical condition  G47.01     Past Psychiatric History: I have reviewed past psychiatric history from my progress note on 08/09/2020.  Past trials of venlafaxine, Prozac, Wellbutrin, Lexapro, Cymbalta higher dosage, Abilify  Past Medical History:  Past Medical History:  Diagnosis Date  . Acne   . Anxiety   . Depression   . Kidney stone     Past Surgical History:  Procedure Laterality Date  . CYSTOSCOPY W/ URETERAL STENT PLACEMENT Right 02/14/2016   Procedure: CYSTOSCOPY WITH STENT EXCHANGE;  Surgeon: Hildred Laser, MD;  Location: [facility];  Service: Urology;  Laterality: Right;  . CYSTOSCOPY WITH STENT PLACEMENT Right 01/14/2016   Procedure: RIGHT CYSTOSCOPY, RIGHT RETROGRADE, RIGHT URETERAL STENT PLACEMENT;  Surgeon: Crist Fat, MD;  Location: [facility];  Service: Urology;  Laterality: Right;  . HOLMIUM LASER  APPLICATION Right 02/14/2016   Procedure: HOLMIUM LASER APPLICATION;  Surgeon: Hildred Laser, MD;  Location: [facility];  Service: Urology;  Laterality: Right;  . URETEROSCOPY WITH HOLMIUM LASER LITHOTRIPSY Right 02/14/2016   Procedure: URETEROSCOPY WITH HOLMIUM LASER LITHOTRIPSY;  Surgeon: Hildred Laser, MD;  Location: [facility];  Service: Urology;  Laterality: Right;    Family Psychiatric History: I have reviewed family psychiatric  history from my progress note on 08/09/2020  Family History:  Family History  Problem Relation Age of Onset  . Stroke Mother   . Seizures Mother   . Dementia Mother   . Cancer Mother        Cervical?  . Nephrolithiasis Mother   . Hematuria Mother   . Depression Mother   . Hypertension Father   . Arthritis Father   . Heart disease Father   . Depression Sister     Social History: I have reviewed social history from my progress note on 08/09/2020 Social History   Socioeconomic History  . Marital status: Divorced    Spouse name: Not on file  . Number of children: Not on file  . Years of education: Not on file  . Highest education level: Not on file  Occupational History  . Not on file  Tobacco Use  . Smoking status: Former Smoker    Quit date: 04/05/2020    Years since quitting: 0.4  . Smokeless tobacco: Never Used  . Tobacco comment: june 2021  Vaping Use  . Vaping Use: Every day  . Substances: Nicotine  Substance and Sexual Activity  . Alcohol use: Yes    Alcohol/week: 0.0 standard drinks    Comment: social  . Drug use: No  . Sexual activity: Not on file  Other Topics Concern  . 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      Social Determinants of Health   Financial Resource Strain:   . Difficulty of Paying Living Expenses: Not on file  Food Insecurity:   . Worried About Programme researcher, broadcasting/film/video in the Last Year: Not on file  . Ran Out of Food in the Last Year: Not on file  Transportation Needs:   . Lack of Transportation (Medical): Not on file  . Lack of Transportation (Non-Medical): Not on file  Physical Activity:   . Days of Exercise per Week: Not on file  . Minutes of Exercise per Session: Not on file  Stress:   . Feeling of Stress : Not on file  Social Connections:   . Frequency of Communication with Friends and Family: Not on file  . Frequency of Social Gatherings  with Friends and Family: Not on file  . Attends Religious Services: Not on file  . Active Member of Clubs or Organizations: Not on file  . Attends Banker Meetings: Not on file  . Marital Status: Not on file    Allergies:  Allergies  Allergen Reactions  . Tramadol Nausea Only    Metabolic Disorder Labs: Lab Results  Component Value Date   HGBA1C 5.8 08/21/2020   No results found for: PROLACTIN Lab Results  Component Value Date   CHOL 166 08/21/2020   TRIG 307.0 (H) 08/21/2020   HDL 28.60 (L) 08/21/2020   CHOLHDL 6 08/21/2020   VLDL 61.4 (H) 08/21/2020   Lab Results  Component Value Date   TSH 1.70 08/21/2020  TSH 1.520 06/02/2019    Therapeutic Level Labs: No results found for: LITHIUM No results found for: VALPROATE No components found for:  CBMZ  Current Medications: Current Outpatient Medications  Medication Sig Dispense Refill  . Ascorbic Acid (VITAMIN C) 100 MG tablet Take 100 mg by mouth daily.    . Bacillus Coagulans-Inulin (PROBIOTIC) 1-250 BILLION-MG CAPS Take by mouth.    . Biotin 10 MG TABS     . DULoxetine (CYMBALTA) 20 MG capsule Take 1 capsule (20 mg total) by mouth daily. For anxiety and depression. 30 capsule 2  . hydrOXYzine (ATARAX/VISTARIL) 25 MG tablet Take 1-2 tablets (25-50 mg total) by mouth at bedtime as needed for anxiety. And sleep 30 tablet 0  . lamoTRIgine (LAMICTAL) 25 MG tablet Take 1 tablet (25 mg total) by mouth daily. 30 tablet 1  . Melatonin 5 MG CAPS Take by mouth.     No current facility-administered medications for this visit.     Musculoskeletal: Strength & Muscle Tone: UTA Gait & Station: UTA Patient leans: N/A  Psychiatric Specialty Exam: Review of Systems  Constitutional: Positive for fatigue.  Psychiatric/Behavioral: Positive for decreased concentration, dysphoric mood and sleep disturbance. The patient is nervous/anxious.   All other systems reviewed and are negative.   There were no vitals taken  for this visit.There is no height or weight on file to calculate BMI.  General Appearance: Casual  Eye Contact:  Fair  Speech:  Clear and Coherent  Volume:  Normal  Mood:  Anxious and Depressed  Affect:  Congruent  Thought Process:  Goal Directed and Descriptions of Associations: Intact  Orientation:  Full (Time, Place, and Person)  Thought Content: Logical   Suicidal Thoughts:  No  Homicidal Thoughts:  No  Memory:  Immediate;   Fair Recent;   Fair Remote;   Fair  Judgement:  Fair  Insight:  Fair  Psychomotor Activity:  Normal  Concentration:  Concentration: Fair and Attention Span: Fair  Recall:  F[employer]ervFair  Fund of Knowledge: Fair  Language: Fair  Akathisia:  No  Handed:  Right  AIMS (if indicated):UTA  Assets:  Communication Skills Desire for Improvement Housing  ADL's:  Intact  Cognition: WNL  Sleep:  Poor   Screenings: GAD-7     [facility]  Total GAD-7 Score 14 11    PHQ2-9     Video Visit [facility] 08/09/2020 [facility] Office [facility]  PHQ-2 Total Score 6 4 4   PHQ-9 Total Score 23 19 20        Assessment and Plan: Molly Miller is a 39 year old Caucasian female, employed, divorced, lives in [place], has a history of bipolar disorder, GAD was evaluated by telemedicine today.  Patient is biologically predisposed given her family history, history of trauma.  Patient with psychosocial stressors of situational stressors.  Patient also with adverse side effects to Abilify.  She continues to struggle with mood symptoms as well as sleep issues.  Plan as noted below.  Plan Bipolar disorder type II depressed, mixed mild-unstable Discontinue Abilify due to side effects. Start Lamictal 25 mg p.o. daily.   Discussed side effects including Stevens-Johnson syndrome. Continue Cymbalta 20 mg p.o. daily.  GAD-improving Continue CBT Cymbalta 20 mg p.o. daily Hydroxyzine 25 to 50 mg p.o. nightly as needed  Insomnia-unstable Discussed to work on sleep hygiene techniques.  Discussed sleep restriction.  Discussed setting up bedtime and awake at times.  Switching of laptop other devices and TV at least couple of hours before bedtime.  Avoiding caffeine and alcohol. Increase melatonin to 10 mg p.o. nightly She also has hydroxyzine available as needed. If she continues to struggle with sleep then will consider prescribing a sleep aid.  I have reviewed and discussed TSH dated 08/21/2020-within normal limits  I have reviewed EKG dated 08/21/2020-QTC within normal limits.  Follow-up in clinic in 3 to 4 weeks or sooner if needed.  I have spent atleast 20 minutes face to face by video with patient today. More than 50 % of the time was spent for preparing to see the patient ( e.g., review of test, records ), ordering medications and test ,psychoeducation and supportive psychotherapy and care coordination,as well as documenting clinical information in electronic health record. This note was generated in part or whole with voice recognition software. Voice recognition is usually quite accurate but there are transcription errors that can and very often do occur. I apologize for any typographical errors that were not detected and corrected.       Jomarie Longs, MD 09/11/2020, 4:57 PM

## 2020-09-11 NOTE — Patient Instructions (Signed)
Lamotrigine tablets What is this medicine? LAMOTRIGINE (la MOE tri jeen) is used to control seizures in adults and children with epilepsy and Lennox-Gastaut syndrome. It is also used in adults to treat bipolar disorder. This medicine may be used for other purposes; ask your health care provider or pharmacist if you have questions. COMMON BRAND NAME(S): Lamictal, Subvenite What should I tell my health care provider before I take this medicine? They need to know if you have any of these conditions:  aseptic meningitis during prior use of lamotrigine  depression  folate deficiency  kidney disease  liver disease  suicidal thoughts, plans, or attempt; a previous suicide attempt by you or a family member  an unusual or allergic reaction to lamotrigine or other seizure medications, other medicines, foods, dyes, or preservatives  pregnant or trying to get pregnant  breast-feeding How should I use this medicine? Take this medicine by mouth with a glass of water. Follow the directions on the prescription label. Do not chew these tablets. If this medicine upsets your stomach, take it with food or milk. Take your doses at regular intervals. Do not take your medicine more often than directed. A special MedGuide will be given to you by the pharmacist with each new prescription and refill. Be sure to read this information carefully each time. Talk to your pediatrician regarding the use of this medicine in children. While this drug may be prescribed for children as young as 2 years for selected conditions, precautions do apply. Overdosage: If you think you have taken too much of this medicine contact a poison control center or emergency room at once. NOTE: This medicine is only for you. Do not share this medicine with others. What if I miss a dose? If you miss a dose, take it as soon as you can. If it is almost time for your next dose, take only that dose. Do not take double or extra doses. What may  interact with this medicine?  atazanavir  carbamazepine  female hormones, including contraceptive or birth control pills  lopinavir  methotrexate  phenobarbital  phenytoin  primidone  pyrimethamine  rifampin  ritonavir  trimethoprim  valproic acid This list may not describe all possible interactions. Give your health care provider a list of all the medicines, herbs, non-prescription drugs, or dietary supplements you use. Also tell them if you smoke, drink alcohol, or use illegal drugs. Some items may interact with your medicine. What should I watch for while using this medicine? Visit your doctor or health care provider for regular checks on your progress. If you take this medicine for seizures, wear a Medic Alert bracelet or necklace. Carry an identification card with information about your condition, medicines, and doctor or health care provider. It is important to take this medicine exactly as directed. When first starting treatment, your dose will need to be adjusted slowly. It may take weeks or months before your dose is stable. You should contact your doctor or health care provider if your seizures get worse or if you have any new types of seizures. Do not stop taking this medicine unless instructed by your doctor or health care provider. Stopping your medicine suddenly can increase your seizures or their severity. This medicine may cause serious skin reactions. They can happen weeks to months after starting the medicine. Contact your health care provider right away if you notice fevers or flu-like symptoms with a rash. The rash may be red or purple and then turn into blisters or peeling   of the skin. Or, you might notice a red rash with swelling of the face, lips or lymph nodes in your neck or under your arms. You may get drowsy, dizzy, or have blurred vision. Do not drive, use machinery, or do anything that needs mental alertness until you know how this medicine affects you. To  reduce dizzy or fainting spells, do not sit or stand up quickly, especially if you are an older patient. Alcohol can increase drowsiness and dizziness. Avoid alcoholic drinks. If you are taking this medicine for bipolar disorder, it is important to report any changes in your mood to your doctor or health care provider. If your condition gets worse, you get mentally depressed, feel very hyperactive or manic, have difficulty sleeping, or have thoughts of hurting yourself or committing suicide, you need to get help from your health care provider right away. If you are a caregiver for someone taking this medicine for bipolar disorder, you should also report these behavioral changes right away. The use of this medicine may increase the chance of suicidal thoughts or actions. Pay special attention to how you are responding while on this medicine. Your mouth may get dry. Chewing sugarless gum or sucking hard candy, and drinking plenty of water may help. Contact your doctor if the problem does not go away or is severe. Women who become pregnant while using this medicine may enroll in the [place] Antiepileptic Drug Pregnancy Registry by calling 1-888-233-2334. This registry collects information about the safety of antiepileptic drug use during pregnancy. This medicine may cause a decrease in folic acid. You should make sure that you get enough folic acid while you are taking this medicine. Discuss the foods you eat and the vitamins you take with your health care provider. What side effects may I notice from receiving this medicine? Side effects that you should report to your doctor or health care professional as soon as possible:  allergic reactions like skin rash, itching or hives, swelling of the face, lips, or tongue  changes in vision  depressed mood  elevated mood, decreased need for sleep, racing thoughts, impulsive behavior  loss of balance or coordination  mouth sores  rash, fever, and  swollen lymph nodes  redness, blistering, peeling or loosening of the skin, including inside the mouth  right upper belly pain  seizures  severe muscle pain  signs and symptoms of aseptic meningitis such as stiff neck and sensitivity to light, headache, drowsiness, fever, nausea, vomiting, rash  signs of infection - fever or chills, cough, sore throat, pain or difficulty passing urine  suicidal thoughts or other mood changes  swollen lymph nodes  trouble walking  unusual bruising or bleeding  unusually weak or tired  yellowing of the eyes or skin Side effects that usually do not require medical attention (report to your doctor or health care professional if they continue or are bothersome):  diarrhea  dizziness  dry mouth  stuffy nose  tiredness  tremors  trouble sleeping This list may not describe all possible side effects. Call your doctor for medical advice about side effects. You may report side effects to FDA at 1-800-FDA-1088. Where should I keep my medicine? Keep out of reach of children. Store at room temperature between 15 and 30 degrees C (59 and 86 degrees F). Throw away any unused medicine after the expiration date. NOTE: This sheet is a summary. It may not cover all possible information. If you have questions about this medicine, talk to your doctor,   pharmacist, or health care provider.  2020 Elsevier/Gold Standard (2019-01-22 15:03:40) Stevens-Johnson Syndrome Stevens-Johnson syndrome is a rare disorder of the mucous membranes and skin. The syndrome tends to progress through several stages:  The mucous membranes become inflamed.  The top layer of skin dies and starts to shed. The more skin that dies, the more serious the disorder becomes.  The body loses fluids quickly.  The body loses its ability to keep germs out. This condition requires immediate treatment in the hospital to prevent complications such as:  Too much fluid loss.  Blood  infection.  Eye damage.  Skin damage and infection.  Vision loss, if the eyes are affected.  Damage to the lungs, heart, kidneys, or liver. What are the causes? The most common cause of this condition is an allergic reaction to a medicine. Medicines that are known to cause this condition include:  Antibiotic medicines.  Antiseizure medicines.  Medicine that is used to treat gout.  NSAIDs. This condition can also be caused by an infection. In some cases, the cause may not be known. What increases the risk? You are more likely to develop this condition if you have:  A variation in the HLA gene. This variation may be passed down through families (inherited).  A family history of Stevens-Johnson syndrome.  Cancer, or you are having cancer treatment.  A weak body defense system (immune system).  Systemic lupus erythematosus. This condition is more likely to develop in people of Asian descent. What are the signs or symptoms? This condition often begins with several days of flu-like symptoms. Symptoms of this condition include:  Fever.  Sore throat.  Fatigue.  Headache.  Muscle aches.  Dry cough.  Burning feeling in the eyes. A painful red or purple rash may develop on the face, trunk, palms, or soles, and spread to other parts of the body. The rash creates blisters and open sores on the skin. If the mucous membranes are affected, the rash may be in the:  Mouth.  Nose.  Eyes.  Genitals.  Digestive tract.  Urinary tract. Other signs and symptoms include:  Shedding of the skin or mucous membranes.  Swelling of the tongue and face.  Itchy, red, swollen areas of skin (hives).  Redness, sensitivity to light, and dryness in the eyes.  Pain in the mouth and throat.  Pain when urinating.  Pain when swallowing. How is this diagnosed? This condition is diagnosed based on:  A physical exam.  Tests, such as: ? A biopsy. This involves removing a sample  of skin or eye tissue to be looked at under a microscope. ? Blood tests. ? Imaging tests. If you are having any eye symptoms, you may need to be seen by an eye specialist (ophthalmologist). How is this treated? This condition may be treated by:  Stopping medicines that may be causing symptoms.  Getting fluids and nourishment through an IV or through a tube that is passed through your nose and into your stomach (nasogastric tube or NG tube).  Gently removing dead skin and putting a moist bandage (dressing) with medicines on those areas.  Applying eye drops or having eye surgery.  Using a mouthwash that numbs the mouth and throat to help with swallowing.  Taking medicines to: ? Help you relax (sedatives). ? Control your pain. ? Fight infection (antibiotics). ? Stop skin swelling and itching. Follow these instructions at home: Medicines  Take over-the-counter and prescription medicines only as told by your health care provider.  If  you were prescribed an antibiotic medicine, take it as told by your health care provider. Do not stop using the antibiotic even if you start to feel better.  If a medicine triggered your condition, talk with your health care provider before you take the medicine again. Do not take it if your health care provider tells you not to.  Do not start taking any new medicines before you ask your health care provider if they are safe for you. 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 If the condition was caused by a medicine, always tell your health care providers which medicine caused it.  Follow instructions from your health care provider about: ? How to clean and take care of your skin. ? When to change and remove dressings. ? How to protect your skin from the sun.  Wear a medical bracelet or necklace that says that you had this condition and tells the cause of it.  Ask your health care  provider if you should be tested for the HLA gene.  Keep all follow-up visits as told by your health care provider. This is important. Contact a health care provider if:  You have trouble managing complications of the condition. Get help right away if:  You have flu-like symptoms after you have an infection or after you start a new medicine.  You develop symptoms on your skin or mucous membranes again. These symptoms may represent a serious problem that is an emergency. Do not wait to see if the symptoms will go away. Get medical help right away. Call your local emergency services (911 in the [place] Do not drive yourself to the hospital. Summary  Stevens-Johnson syndrome is a disorder of the mucous membranes and skin.  This condition requires immediate treatment in the hospital to prevent complications.  If a medicine triggered your condition, talk with your health care provider before you take the medicine again. Do not take it if your health care provider tells you not to.  Wear a medical bracelet or necklace that says that you had this condition and tells the cause of it.  Get help right away if you have flu-like symptoms after you have an infection or after you start a new medicine. This information is not intended to replace advice given to you by your health care provider. Make sure you discuss any questions you have with your health care provider. Document Revised: 09/08/2018 Document Reviewed: 09/08/2018 Elsevier Patient Education  2020 [employer].

## 2020-09-18 DIAGNOSIS — F3181 Bipolar II disorder: Secondary | ICD-10-CM | POA: Diagnosis not present

## 2020-09-18 DIAGNOSIS — Z9189 Other specified personal risk factors, not elsewhere classified: Secondary | ICD-10-CM | POA: Diagnosis not present

## 2020-09-18 DIAGNOSIS — F411 Generalized anxiety disorder: Secondary | ICD-10-CM | POA: Diagnosis not present

## 2020-09-21 ENCOUNTER — Ambulatory Visit: Payer: BC Managed Care – PPO | Admitting: Licensed Clinical Social Worker

## 2020-09-21 ENCOUNTER — Other Ambulatory Visit: Payer: Self-pay

## 2020-10-12 ENCOUNTER — Telehealth (INDEPENDENT_AMBULATORY_CARE_PROVIDER_SITE_OTHER): Payer: BC Managed Care – PPO | Admitting: Psychiatry

## 2020-10-12 ENCOUNTER — Other Ambulatory Visit: Payer: Self-pay

## 2020-10-12 ENCOUNTER — Encounter: Payer: Self-pay | Admitting: Psychiatry

## 2020-10-12 DIAGNOSIS — F3181 Bipolar II disorder: Secondary | ICD-10-CM | POA: Diagnosis not present

## 2020-10-12 DIAGNOSIS — F411 Generalized anxiety disorder: Secondary | ICD-10-CM

## 2020-10-12 DIAGNOSIS — G4701 Insomnia due to medical condition: Secondary | ICD-10-CM | POA: Diagnosis not present

## 2020-10-12 MED ORDER — L-METHYLFOLATE 7.5 MG PO TABS: 7.5000 mg | ORAL_TABLET | Freq: Every morning | ORAL | 3 refills | Status: DC

## 2020-10-12 NOTE — Progress Notes (Signed)
Virtual Visit via Telephone Note  I connected with Molly Miller on 10/12/20 at 11:30 AM EST by telephone and verified that I am speaking with the correct person using two identifiers.  Location Provider Location : [facility] Patient Location : Work  Participants: Patient , Provider   I discussed the limitations, risks, security and privacy concerns of performing an evaluation and management service by telephone and the availability of in person appointments. I also discussed with the patient that there may be a patient responsible charge related to this service. The patient expressed understanding and agreed to proceed.   I discussed the assessment and treatment plan with the patient. The patient was provided an opportunity to ask questions and all were answered. The patient agreed with the plan and demonstrated an understanding of the instructions.   The patient was advised to call back or seek an in-person evaluation if the symptoms worsen or if the condition fails to improve as anticipated.   BH MD OP Progress Note  10/12/2020 11:54 AM Molly Miller  MRN:  [id number]  Chief Complaint:  Chief Complaint    Follow-up     HPI: Molly Miller is a 39 year old Caucasian female, divorced, currently lives with her boyfriend in [place], has a history of bipolar disorder type II, GAD was evaluated by phone today.  Patient today reports she has not yet started the Lamictal yet.  She was waiting for the GeneSight testing to be back.  She reports the Cymbalta does help to some extent with her mood however she continues to struggle with mood swings, irritability.  She reports sleep as okay.  She also takes melatonin and hydroxyzine which helps.  Patient denies any suicidality, homicidality or perceptual disturbances.  Patient reports work is going well.  She continues to be in psychotherapy sessions which are beneficial.  She denies any other concerns today.  GeneSight testing  results were reviewed and discussed with patient during the session today.    Visit Diagnosis:    ICD-10-CM   1. Bipolar 2 disorder (HCC)  F31.81 L-Methylfolate 7.5 MG TABS   depressed mild with mixed features  2. GAD (generalized anxiety disorder)  F41.1 L-Methylfolate 7.5 MG TABS  3. Insomnia due to medical condition  G47.01    anxiety, bipolar    Past Psychiatric History: I have reviewed past psychiatric history from my progress note on 08/09/2020.  Past trials of venlafaxine, Prozac, Wellbutrin, Lexapro, Cymbalta, Abilify  Past Medical History:  Past Medical History:  Diagnosis Date  . Acne   . Anxiety   . Depression   . Kidney stone     Past Surgical History:  Procedure Laterality Date  . CYSTOSCOPY W/ URETERAL STENT PLACEMENT Right 02/14/2016   Procedure: CYSTOSCOPY WITH STENT EXCHANGE;  Surgeon: Hildred Laser, MD;  Location: [facility];  Service: Urology;  Laterality: Right;  . CYSTOSCOPY WITH STENT PLACEMENT Right 01/14/2016   Procedure: RIGHT CYSTOSCOPY, RIGHT RETROGRADE, RIGHT URETERAL STENT PLACEMENT;  Surgeon: Crist Fat, MD;  Location: [facility];  Service: Urology;  Laterality: Right;  . HOLMIUM LASER APPLICATION Right 02/14/2016   Procedure: HOLMIUM LASER APPLICATION;  Surgeon: Hildred Laser, MD;  Location: [facility];  Service: Urology;  Laterality: Right;  . URETEROSCOPY WITH HOLMIUM LASER LITHOTRIPSY Right 02/14/2016   Procedure: URETEROSCOPY WITH HOLMIUM LASER LITHOTRIPSY;  Surgeon: Hildred Laser, MD;  Location: [facility];  Service: Urology;  Laterality: Right;    Family Psychiatric History: I have reviewed family psychiatric history  from my progress note on 08/09/2020  Family History:  Family History  Problem Relation Age of Onset  . Stroke Mother   . Seizures Mother   . Dementia Mother   . Cancer Mother        Cervical?  . Nephrolithiasis Mother   . Hematuria Mother   . Depression Mother   . Hypertension Father   . Arthritis Father   .  Heart disease Father   . Depression Sister     Social History: Reviewed social history from my progress note on 08/09/2020 Social History   Socioeconomic History  . Marital status: Divorced    Spouse name: Not on file  . Number of children: Not on file  . Years of education: Not on file  . Highest education level: Not on file  Occupational History  . Not on file  Tobacco Use  . Smoking status: Former Smoker    Quit date: 04/05/2020    Years since quitting: 0.5  . Smokeless tobacco: Never Used  . Tobacco comment: june 2021  Vaping Use  . Vaping Use: Every day  . Substances: Nicotine  Substance and Sexual Activity  . Alcohol use: Yes    Alcohol/week: 0.0 standard drinks    Comment: social  . Drug use: No  . Sexual activity: Not on file  Other Topics Concern  . 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      Social Determinants of Health   Financial Resource Strain: Not on file  Food Insecurity: Not on file  Transportation Needs: Not on file  Physical Activity: Not on file  Stress: Not on file  Social Connections: Not on file    Allergies:  Allergies  Allergen Reactions  . Tramadol Nausea Only    Metabolic Disorder Labs: Lab Results  Component Value Date   HGBA1C 5.8 08/21/2020   No results found for: PROLACTIN Lab Results  Component Value Date   CHOL 166 08/21/2020   TRIG 307.0 (H) 08/21/2020   HDL 28.60 (L) 08/21/2020   CHOLHDL 6 08/21/2020   VLDL 61.4 (H) 08/21/2020   Lab Results  Component Value Date   TSH 1.70 08/21/2020   TSH 1.520 06/02/2019    Therapeutic Level Labs: No results found for: LITHIUM No results found for: VALPROATE No components found for:  CBMZ  Current Medications: Current Outpatient Medications  Medication Sig Dispense Refill  . Ascorbic Acid (VITAMIN C) 100 MG tablet Take 100 mg by mouth daily.    . Bacillus Coagulans-Inulin  (PROBIOTIC) 1-250 BILLION-MG CAPS Take by mouth.    . Biotin 10 MG TABS     . DULoxetine (CYMBALTA) 20 MG capsule Take 1 capsule (20 mg total) by mouth daily. For anxiety and depression. 30 capsule 2  . hydrOXYzine (ATARAX/VISTARIL) 25 MG tablet Take 1-2 tablets (25-50 mg total) by mouth at bedtime as needed for anxiety. And sleep 30 tablet 0  . L-Methylfolate 7.5 MG TABS Take 1 tablet (7.5 mg total) by mouth every morning. 30 tablet 3  . lamoTRIgine (LAMICTAL) 25 MG tablet Take 1 tablet (25 mg total) by mouth daily. 30 tablet 1  . Melatonin 5 MG CAPS Take by mouth.     No current facility-administered medications for this visit.     Musculoskeletal: Strength & Muscle Tone: UTA Gait & Station: UTA Patient leans: N/A  Psychiatric Specialty Exam: Review of Systems  Psychiatric/Behavioral: Positive for sleep disturbance (improving).       Mood swings , irritable  All other systems reviewed and are negative.   There were no vitals taken for this visit.There is no height or weight on file to calculate BMI.  General Appearance: UTA  Eye Contact:  UTA  Speech:  Clear and Coherent  Volume:  Normal  Mood:  Irritable and Mood swings  Affect:  UTA  Thought Process:  Goal Directed and Descriptions of Associations: Intact  Orientation:  Full (Time, Place, and Person)  Thought Content: Logical   Suicidal Thoughts:  No  Homicidal Thoughts:  No  Memory:  Immediate;   Fair Recent;   Fair Remote;   Fair  Judgement:  Fair  Insight:  Fair  Psychomotor Activity:  UTA  Concentration:  Concentration: Fair and Attention Span: Fair  Recall:  [employer] of Knowledge: Fair  Language: Fair  Akathisia:  No  Handed:  Right  AIMS (if indicated): UTA  Assets:  Communication Skills Desire for Improvement Housing Social Support Talents/Skills Transportation Vocational/Educational  ADL's:  Intact  Cognition: WNL  Sleep:  Improving   Screenings: GAD-7   Flowsheet Row Video Visit from  08/09/2020 in [facility] Office Visit from 10/09/2017 in [facility]  Total GAD-7 Score 14 11    PHQ2-9   Flowsheet Row Video Visit [facility] 08/09/2020 in [facility] Office Visit from 10/09/2017 in [facility] Visit from 03/30/2015 in [facility] Total Score 6 4 4   PHQ-9 Total Score 23 19 20        Assessment and Plan: Molly Miller is a 39 year old Caucasian female, employed, lives in [place], has a history of bipolar disorder, GAD was evaluated by telemedicine today.  Patient is biologically predisposed given her family history, history of trauma.  Patient will continue to benefit from medication readjustment and she continues to struggle with mood symptoms.  Plan as noted below.  Plan Bipolar disorder type II depressed mixed mild-unstable Start Lamictal 25 mg p.o. daily. Continue Cymbalta 20 mg p.o. daily. Even though Lamictal was prescribed last visit she did not start it since she wanted to wait for the GeneSight testing to be back.  GAD-improving Continue CBT Cymbalta 20 mg p.o. daily Hydroxyzine 25 to 50 mg p.o. nightly as needed  Insomnia-improving Continue sleep hygiene techniques Hydroxyzine 25 to 50 mg p.o. nightly as needed Melatonin 10 mg p.o. nightly  GeneSight testing results were discussed with patient today.  Answered all her questions. 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  She is agreeable.  Follow-up in clinic in 4 weeks or sooner if needed.  I have spent atleast 30 minutes non face to face  with patient today. More than 50 % of the time was spent for preparing to see the patient ( e.g., review of test, records ), ordering medications and test ,psychoeducation and supportive psychotherapy and care coordination,as well as documenting clinical information in electronic health  record,interpreting and communication of test results This note was generated in part or whole with voice recognition software. Voice recognition is usually quite accurate but there are transcription errors that can and very often do occur. I apologize for any typographical errors that were not detected and corrected.         24, MD 10/13/2020, 8:04 AM

## 2020-10-13 ENCOUNTER — Other Ambulatory Visit: Payer: Self-pay | Admitting: Psychiatry

## 2020-10-13 DIAGNOSIS — F3181 Bipolar II disorder: Secondary | ICD-10-CM

## 2020-10-13 DIAGNOSIS — F411 Generalized anxiety disorder: Secondary | ICD-10-CM

## 2020-11-10 ENCOUNTER — Telehealth (INDEPENDENT_AMBULATORY_CARE_PROVIDER_SITE_OTHER): Payer: BC Managed Care – PPO | Admitting: Psychiatry

## 2020-11-10 ENCOUNTER — Encounter: Payer: Self-pay | Admitting: Psychiatry

## 2020-11-10 ENCOUNTER — Other Ambulatory Visit: Payer: Self-pay

## 2020-11-10 DIAGNOSIS — F3181 Bipolar II disorder: Secondary | ICD-10-CM

## 2020-11-10 DIAGNOSIS — G4701 Insomnia due to medical condition: Secondary | ICD-10-CM | POA: Diagnosis not present

## 2020-11-10 DIAGNOSIS — F411 Generalized anxiety disorder: Secondary | ICD-10-CM

## 2020-11-10 IMAGING — DX LEFT ANKLE COMPLETE - 3+ VIEW
3 series · 3 of 3 positions shown · non-contrast
Comparison: None.

CLINICAL DATA: Left ankle swelling for 2-3 weeks.  No trauma.

EXAM:
LEFT ANKLE COMPLETE - 3+ VIEW

[ankle ap]
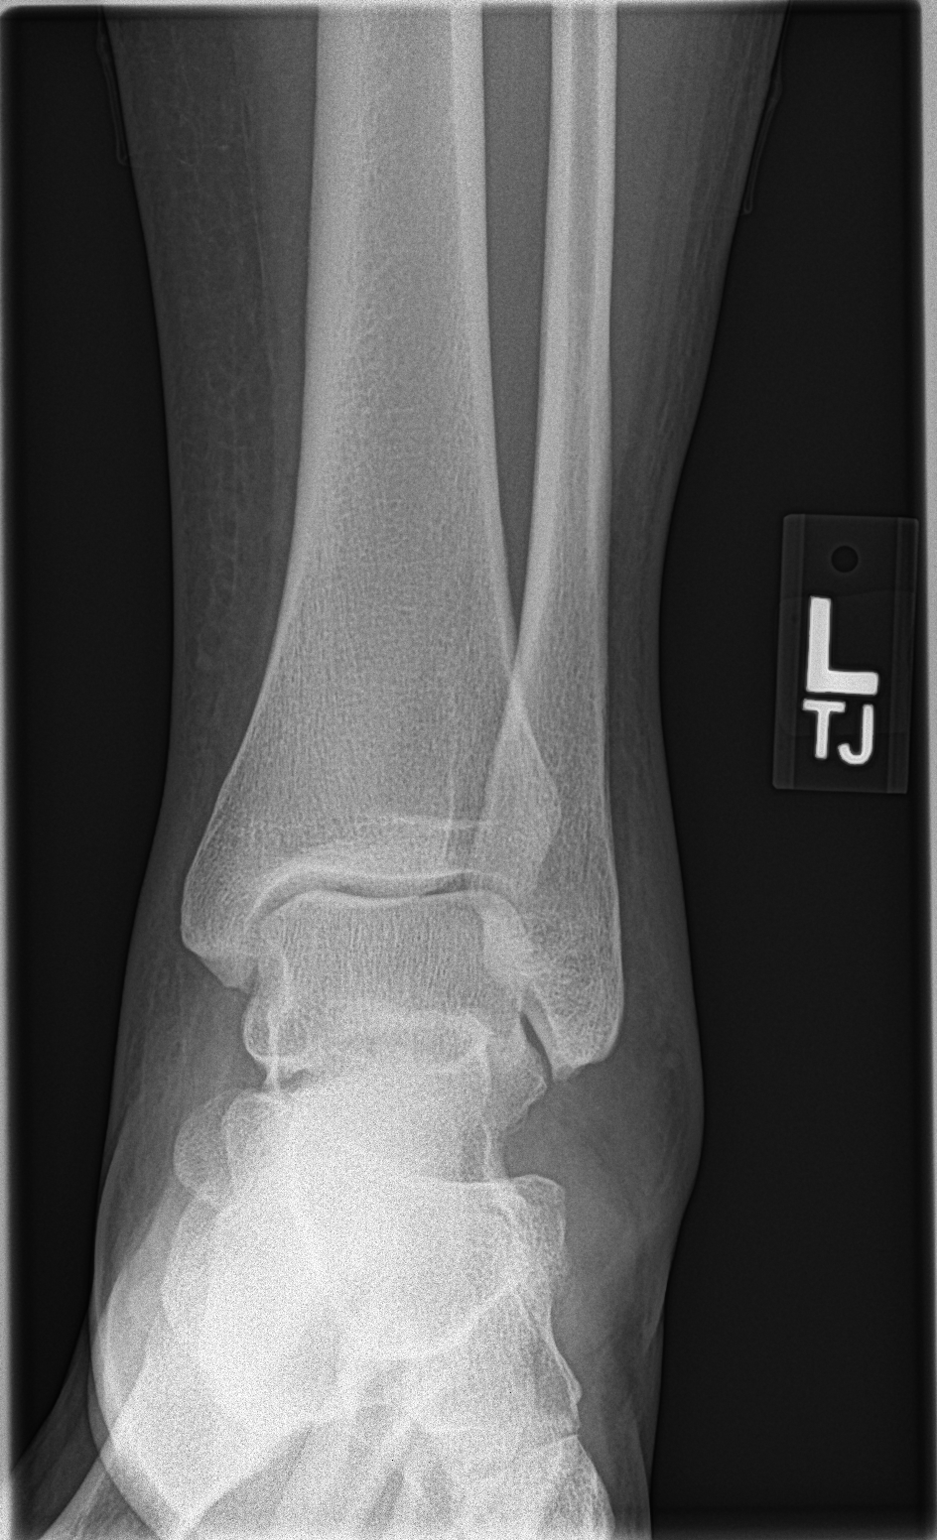

[ankle obl]
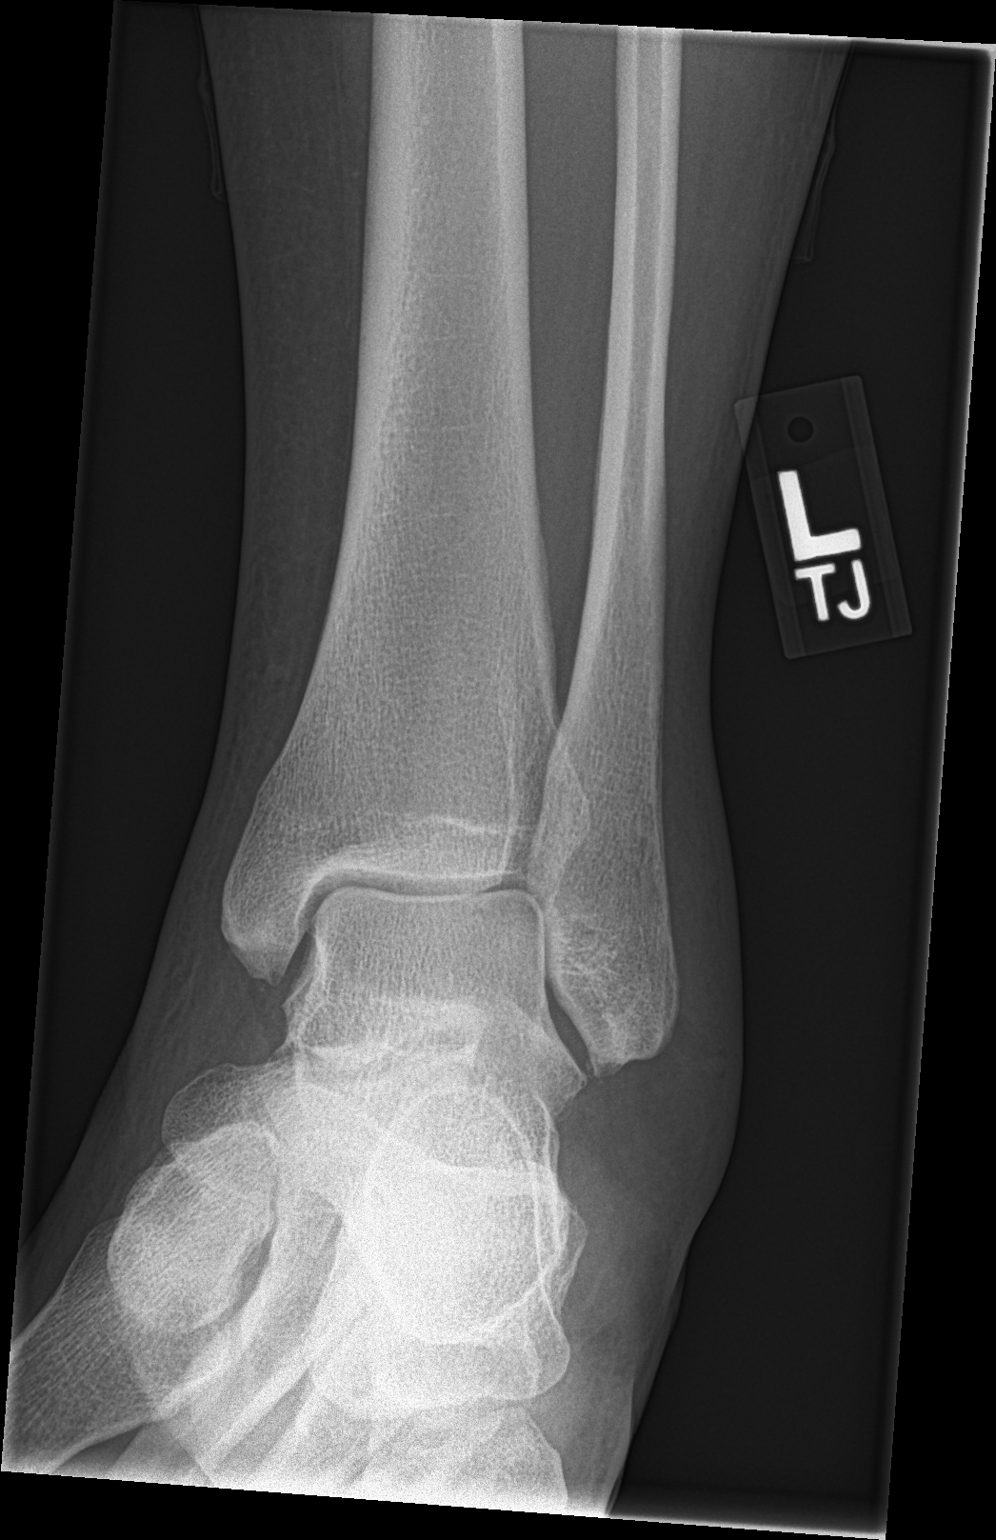

[ankle lat]
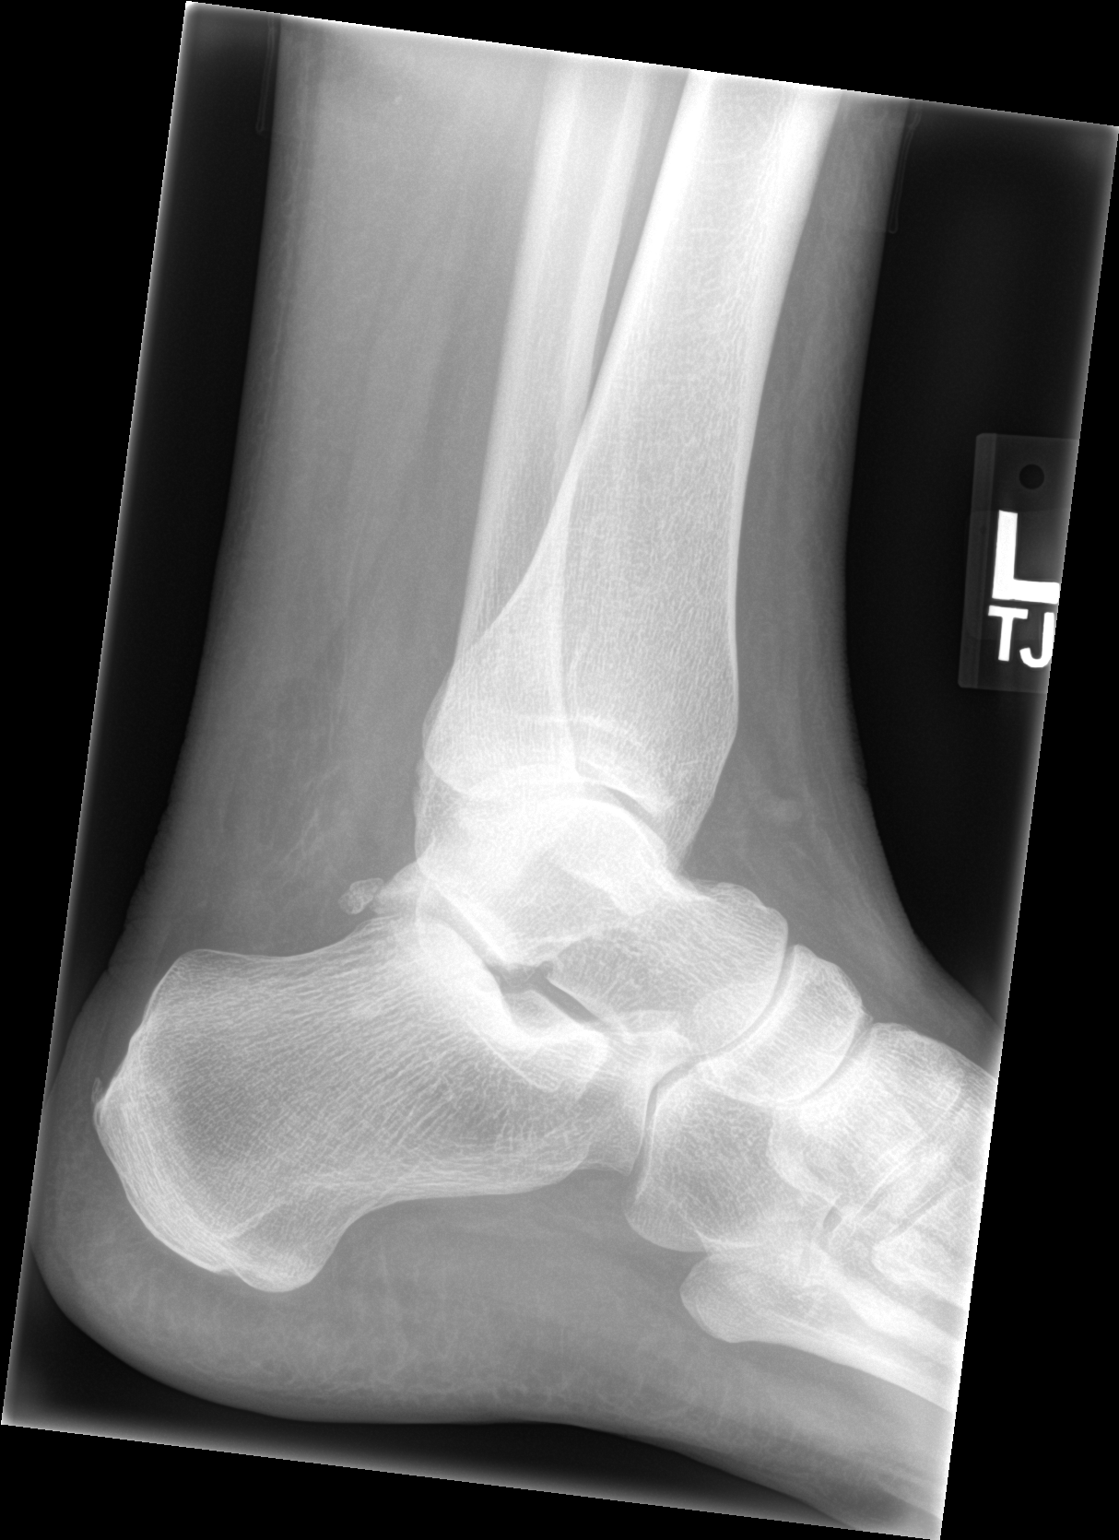

[3 of 3 positions shown; findings below may reference images not displayed]

FINDINGS: Soft tissue swelling identified diffusely, most prominent laterally.
No fracture or dislocation. No bony lesion. The ankle mortise is
intact. No other acute abnormalities.
IMPRESSION: Soft tissue swelling.  An underlying cause is not seen.

## 2020-11-10 MED ORDER — HYDROXYZINE HCL 25 MG PO TABS: 25.0000 mg | ORAL_TABLET | Freq: Every evening | ORAL | 1 refills | Status: DC | PRN

## 2020-11-10 MED ORDER — LAMOTRIGINE 25 MG PO TABS: 25.0000 mg | ORAL_TABLET | Freq: Two times a day (BID) | ORAL | 1 refills | Status: DC

## 2020-11-10 NOTE — Progress Notes (Signed)
Virtual Visit via Telephone Note  I connected with Molly Miller on 11/10/20 at  9:20 AM EST by telephone and verified that I am speaking with the correct person using two identifiers.  Location Provider Location : [facility] Patient Location : Car  Participants: Patient , Provider   I discussed the limitations, risks, security and privacy concerns of performing an evaluation and management service by telephone and the availability of in person appointments. I also discussed with the patient that there may be a patient responsible charge related to this service. The patient expressed understanding and agreed to proceed.   I discussed the assessment and treatment plan with the patient. The patient was provided an opportunity to ask questions and all were answered. The patient agreed with the plan and demonstrated an understanding of the instructions.   The patient was advised to call back or seek an in-person evaluation if the symptoms worsen or if the condition fails to improve as anticipated.   [facility] MD OP Progress Note  11/10/2020 1:40 PM Molly Miller  MRN:  [id number]  Chief Complaint:  Chief Complaint    Follow-up     HPI: Molly Miller is a 40 year old Caucasian female, divorced, currently lives with her boyfriend in [place], has a history of bipolar disorder type II, GAD was evaluated by the medicine today.  Patient today reports she continues to struggle with mood swings, irritability.  She does not know if the Lamictal low dosage as causing the irritability or not.  She denies any other side effects.  She is compliant on her medications.  She reports she is compliant on the Cymbalta.  She likes the effect of Cymbalta. . She reports sleep as restless on and off.  She has been taking hydroxyzine 25 mg as needed.  Denies side effects.  Reports it helps.  Patient denies any suicidality, homicidality or perceptual disturbances.  She reports work is going well.  Patient  denies any other concerns today.    Visit Diagnosis:    ICD-10-CM   1. Bipolar 2 disorder (HCC)  F31.81 lamoTRIgine (LAMICTAL) 25 MG tablet   depressed, mild with mixed features  2. GAD (generalized anxiety disorder) Active F41.1 hydrOXYzine (ATARAX/VISTARIL) 25 MG tablet  3. Insomnia due to medical condition  G47.01     Past Psychiatric History: I have reviewed past psychiatric history from my progress note on 08/09/2020.  Past trials of venlafaxine, Prozac, Wellbutrin, Lexapro, Cymbalta, Abilify  Past Medical History:  Past Medical History:  Diagnosis Date  . Acne   . Anxiety   . Depression   . Kidney stone     Past Surgical History:  Procedure Laterality Date  . CYSTOSCOPY W/ URETERAL STENT PLACEMENT Right 02/14/2016   Procedure: CYSTOSCOPY WITH STENT EXCHANGE;  Surgeon: Nickie Retort, MD;  Location: [facility];  Service: Urology;  Laterality: Right;  . CYSTOSCOPY WITH STENT PLACEMENT Right 01/14/2016   Procedure: RIGHT CYSTOSCOPY, RIGHT RETROGRADE, RIGHT URETERAL STENT PLACEMENT;  Surgeon: Ardis Hughs, MD;  Location: [facility];  Service: Urology;  Laterality: Right;  . HOLMIUM LASER APPLICATION Right 02/10/8118   Procedure: HOLMIUM LASER APPLICATION;  Surgeon: Nickie Retort, MD;  Location: [facility];  Service: Urology;  Laterality: Right;  . URETEROSCOPY WITH HOLMIUM LASER LITHOTRIPSY Right 02/14/2016   Procedure: URETEROSCOPY WITH HOLMIUM LASER LITHOTRIPSY;  Surgeon: Nickie Retort, MD;  Location: [facility];  Service: Urology;  Laterality: Right;    Family Psychiatric History: I have reviewed family psychiatric history  from my progress note on 08/09/2020  Family History:  Family History  Problem Relation Age of Onset  . Stroke Mother   . Seizures Mother   . Dementia Mother   . Cancer Mother        Cervical?  . Nephrolithiasis Mother   . Hematuria Mother   . Depression Mother   . Hypertension Father   . Arthritis Father   . Heart disease Father   .  Depression Sister     Social History: Reviewed social history from my progress note on 08/09/2020 Social History   Socioeconomic History  . Marital status: Divorced    Spouse name: Not on file  . Number of children: Not on file  . Years of education: Not on file  . Highest education level: Not on file  Occupational History  . Not on file  Tobacco Use  . Smoking status: Former Smoker    Quit date: 04/05/2020    Years since quitting: 0.6  . Smokeless tobacco: Never Used  . Tobacco comment: june 2021  Vaping Use  . Vaping Use: Every day  . Substances: Nicotine  Substance and Sexual Activity  . Alcohol use: Yes    Alcohol/week: 0.0 standard drinks    Comment: social  . Drug use: No  . Sexual activity: Not on file  Other Topics Concern  . 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      Social Determinants of Health   Financial Resource Strain: Not on file  Food Insecurity: Not on file  Transportation Needs: Not on file  Physical Activity: Not on file  Stress: Not on file  Social Connections: Not on file    Allergies:  Allergies  Allergen Reactions  . Tramadol Nausea Only    Metabolic Disorder Labs: Lab Results  Component Value Date   HGBA1C 5.8 08/21/2020   No results found for: PROLACTIN Lab Results  Component Value Date   CHOL 166 08/21/2020   TRIG 307.0 (H) 08/21/2020   HDL 28.60 (L) 08/21/2020   CHOLHDL 6 08/21/2020   VLDL 61.4 (H) 08/21/2020   Lab Results  Component Value Date   TSH 1.70 08/21/2020   TSH 1.520 06/02/2019    Therapeutic Level Labs: No results found for: LITHIUM No results found for: VALPROATE No components found for:  CBMZ  Current Medications: Current Outpatient Medications  Medication Sig Dispense Refill  . lamoTRIgine (LAMICTAL) 25 MG tablet Take 1 tablet (25 mg total) by mouth 2 (two) times daily. 60 tablet 1  . Ascorbic Acid  (VITAMIN C) 100 MG tablet Take 100 mg by mouth daily.    . Bacillus Coagulans-Inulin (PROBIOTIC) 1-250 BILLION-MG CAPS Take by mouth.    . Biotin 10 MG TABS     . DULoxetine (CYMBALTA) 20 MG capsule Take 1 capsule (20 mg total) by mouth daily. For anxiety and depression. 30 capsule 2  . hydrOXYzine (ATARAX/VISTARIL) 25 MG tablet Take 1-2 tablets (25-50 mg total) by mouth at bedtime as needed for anxiety. And sleep 60 tablet 1  . L-Methylfolate 7.5 MG TABS Take 1 tablet (7.5 mg total) by mouth every morning. 30 tablet 3  . Melatonin 5 MG CAPS Take by mouth.     No current facility-administered medications for this visit.     Musculoskeletal: Strength & Muscle Tone: UTA Gait & Station: UTA Patient leans: N/A  Psychiatric Specialty Exam:  Review of Systems  Psychiatric/Behavioral: Positive for sleep disturbance.       Irritable  All other systems reviewed and are negative.   There were no vitals taken for this visit.There is no height or weight on file to calculate BMI.  General Appearance: UTA  Eye Contact:  UTA  Speech:  Clear and Coherent  Volume:  Normal  Mood:  Irritable  Affect:  UTA  Thought Process:  Goal Directed and Descriptions of Associations: Intact  Orientation:  Full (Time, Place, and Person)  Thought Content: Logical   Suicidal Thoughts:  No  Homicidal Thoughts:  No  Memory:  Immediate;   Fair Recent;   Fair Remote;   Fair  Judgement:  Fair  Insight:  Fair  Psychomotor Activity:  UTA  Concentration:  Concentration: Fair and Attention Span: Fair  Recall:  [employer] of Knowledge: Fair  Language: Fair  Akathisia:  No  Handed:  Right  AIMS (if indicated): UTA  Assets:  Communication Skills Desire for Improvement Housing Social Support  ADL's:  Intact  Cognition: WNL  Sleep:  Improving   Screenings: GAD-7   Flowsheet Row Video Visit from 08/09/2020 in [facility] Office Visit from 10/09/2017 in [facility]  Total GAD-7 Score 14 11    PHQ2-9   Flowsheet Row Video Visit from 08/09/2020 in [facility] Office Visit from 10/09/2017 in [facility] Visit from 03/30/2015 in [facility] Total Score 6 4 4   PHQ-9 Total Score 23 19 20        Assessment and Plan: Molly Miller is a 40 year old Caucasian female, employed, lives in [place], has a history of bipolar disorder, GAD was evaluated by telemedicine today.  She is biologically predisposed given her family history, history of trauma.  Patient continues to struggle with irritability and will benefit from medication readjustment.  Plan as noted below.  Plan Bipolar disorder type II-mixed-unstable Increase Lamictal to 25 mg p.o. twice daily Continue Cymbalta 20 mg p.o. daily Continue Deplin 7.5 mg p.o. daily for folic acid conversion reduction.  GAD-some progress Cymbalta 20 mg p.o. daily Hydroxyzine 25 to 50 mg p.o. nightly as needed  Insomnia-improving Hydroxyzine 25 to 50 mg p.o. nightly as needed Melatonin 10 mg p.o. nightly  GeneSight testing results were reviewed while making medication changes today.   Follow-up in clinic in 3 to 4 weeks or sooner if needed.  I have spent atleast 20 minutes non face to face with patient today. More than 50 % of the time was spent for preparing to see the patient ( e.g., review of test, records ), ordering medications and test ,psychoeducation and supportive psychotherapy and care coordination,as well as documenting clinical information in electronic health record. This note was generated in part or whole with voice recognition software. Voice recognition is usually quite accurate but there are transcription errors that can and very often do occur. I apologize for any typographical errors that were not detected and corrected.       24, MD 11/10/2020, 1:40 PM

## 2020-12-01 ENCOUNTER — Other Ambulatory Visit: Payer: Self-pay | Admitting: Primary Care

## 2020-12-01 DIAGNOSIS — F411 Generalized anxiety disorder: Secondary | ICD-10-CM

## 2020-12-04 ENCOUNTER — Other Ambulatory Visit: Payer: Self-pay

## 2020-12-04 ENCOUNTER — Telehealth (INDEPENDENT_AMBULATORY_CARE_PROVIDER_SITE_OTHER): Payer: BC Managed Care – PPO | Admitting: Psychiatry

## 2020-12-04 DIAGNOSIS — Z5329 Procedure and treatment not carried out because of patient's decision for other reasons: Secondary | ICD-10-CM

## 2020-12-04 NOTE — Progress Notes (Signed)
No response to call or text or video invite  

## 2020-12-09 ENCOUNTER — Other Ambulatory Visit: Payer: Self-pay | Admitting: Psychiatry

## 2020-12-09 DIAGNOSIS — F3181 Bipolar II disorder: Secondary | ICD-10-CM

## 2020-12-22 ENCOUNTER — Other Ambulatory Visit: Payer: Self-pay | Admitting: Psychiatry

## 2020-12-22 ENCOUNTER — Other Ambulatory Visit: Payer: Self-pay | Admitting: Primary Care

## 2020-12-22 DIAGNOSIS — F3181 Bipolar II disorder: Secondary | ICD-10-CM

## 2020-12-22 DIAGNOSIS — F32A Depression, unspecified: Secondary | ICD-10-CM

## 2020-12-22 DIAGNOSIS — F419 Anxiety disorder, unspecified: Secondary | ICD-10-CM

## 2021-01-08 ENCOUNTER — Other Ambulatory Visit: Payer: Self-pay

## 2021-01-08 ENCOUNTER — Telehealth (INDEPENDENT_AMBULATORY_CARE_PROVIDER_SITE_OTHER): Payer: BC Managed Care – PPO | Admitting: Psychiatry

## 2021-01-08 ENCOUNTER — Encounter: Payer: Self-pay | Admitting: Psychiatry

## 2021-01-08 ENCOUNTER — Other Ambulatory Visit: Payer: Self-pay | Admitting: Primary Care

## 2021-01-08 DIAGNOSIS — F411 Generalized anxiety disorder: Secondary | ICD-10-CM

## 2021-01-08 DIAGNOSIS — F3181 Bipolar II disorder: Secondary | ICD-10-CM | POA: Diagnosis not present

## 2021-01-08 DIAGNOSIS — G4701 Insomnia due to medical condition: Secondary | ICD-10-CM

## 2021-01-08 MED ORDER — TRAZODONE HCL 50 MG PO TABS: 25.0000 mg | ORAL_TABLET | Freq: Every evening | ORAL | 1 refills | Status: DC | PRN

## 2021-01-08 MED ORDER — DULOXETINE HCL 20 MG PO CPEP: 20.0000 mg | ORAL_CAPSULE | Freq: Every day | ORAL | 2 refills | Status: DC

## 2021-01-08 MED ORDER — LAMOTRIGINE 25 MG PO TABS: 25.0000 mg | ORAL_TABLET | Freq: Every day | ORAL | 1 refills | Status: DC

## 2021-01-08 NOTE — Progress Notes (Signed)
Virtual Visit via Video Note  I connected with Molly Miller on 01/08/21 at  1:30 PM EST by a video enabled telemedicine application and verified that I am speaking with the correct person using two identifiers. Location Provider Location : [facility] Patient Location : Work  Participants: Patient , Provider   I discussed the limitations of evaluation and management by telemedicine and the availability of in person appointments. The patient expressed understanding and agreed to proceed.   I discussed the assessment and treatment plan with the patient. The patient was provided an opportunity to ask questions and all were answered. The patient agreed with the plan and demonstrated an understanding of the instructions.   The patient was advised to call back or seek an in-person evaluation if the symptoms worsen or if the condition fails to improve as anticipated.   [facility] MD OP Progress Note  01/08/2021 9:13 PM TIPHANI MELLS  MRN:  [id number]  Chief Complaint:  Chief Complaint    Follow-up; Anxiety     HPI: Molly Miller is a 40 year old, currently lives with boyfriend in [place] a  history of bipolar do type II, was evaluated by telemedicine today.  Patient today reports she has noticed an improvement in her irritability.  She reports she has been taking only 1 dose of Lamictal and that seems to help.  She is also compliant on the Cymbalta.  She reports sleep as good as long as she takes the hydroxyzine early at around 6 PM.  When she tried taking it late she felt it was making her groggy in the morning.  She reports when she takes it at 6 PM she is able to sleep from 10 PM to around 3 or 4 AM.  She wants to know if there is anything else she can do to sleep longer than that.  She also is worried about starting Zyrtec along woth hydroxyzine since she has seasonal allergies.   She denies any suicidality, homicidality or perceptual disturbances.  Patient reports work is going  well.  She denies any other concerns today.  Visit Diagnosis:    ICD-10-CM   1. Bipolar 2 disorder (HCC)  F31.81 lamoTRIgine (LAMICTAL) 25 MG tablet   depressed with mixed features  2. GAD (generalized anxiety disorder)  F41.1 traZODone (DESYREL) 50 MG tablet    DULoxetine (CYMBALTA) 20 MG capsule  3. Insomnia due to medical condition  G47.01 traZODone (DESYREL) 50 MG tablet    Past Psychiatric History: I have reviewed past psychiatric history from my progress note on 08/09/2020.  Past trials of venlafaxine, Prozac, Wellbutrin, Lexapro, Cymbalta, Abilify  Past Medical History:  Past Medical History:  Diagnosis Date  . Acne   . Anxiety   . Depression   . Kidney stone     Past Surgical History:  Procedure Laterality Date  . CYSTOSCOPY W/ URETERAL STENT PLACEMENT Right 02/14/2016   Procedure: CYSTOSCOPY WITH STENT EXCHANGE;  Surgeon: Hildred Laser, MD;  Location: [facility];  Service: Urology;  Laterality: Right;  . CYSTOSCOPY WITH STENT PLACEMENT Right 01/14/2016   Procedure: RIGHT CYSTOSCOPY, RIGHT RETROGRADE, RIGHT URETERAL STENT PLACEMENT;  Surgeon: Crist Fat, MD;  Location: [facility];  Service: Urology;  Laterality: Right;  . HOLMIUM LASER APPLICATION Right 02/14/2016   Procedure: HOLMIUM LASER APPLICATION;  Surgeon: Hildred Laser, MD;  Location: [facility];  Service: Urology;  Laterality: Right;  . URETEROSCOPY WITH HOLMIUM LASER LITHOTRIPSY Right 02/14/2016   Procedure: URETEROSCOPY WITH HOLMIUM LASER LITHOTRIPSY;  Surgeon: Hildred Laser, MD;  Location: [facility];  Service: Urology;  Laterality: Right;    Family Psychiatric History: I have reviewed family psychiatric history from my progress note on 08/09/2020  Family History:  Family History  Problem Relation Age of Onset  . Stroke Mother   . Seizures Mother   . Dementia Mother   . Cancer Mother        Cervical?  . Nephrolithiasis Mother   . Hematuria Mother   . Depression Mother   . Hypertension  Father   . Arthritis Father   . Heart disease Father   . Depression Sister     Social History: Reviewed social history from my progress note on 08/09/2020 Social History   Socioeconomic History  . Marital status: Divorced    Spouse name: Not on file  . Number of children: Not on file  . Years of education: Not on file  . Highest education level: Not on file  Occupational History  . Not on file  Tobacco Use  . Smoking status: Former Smoker    Quit date: 04/05/2020    Years since quitting: 0.7  . Smokeless tobacco: Never Used  . Tobacco comment: june 2021  Vaping Use  . Vaping Use: Every day  . Substances: Nicotine  Substance and Sexual Activity  . Alcohol use: Yes    Alcohol/week: 0.0 standard drinks    Comment: social  . Drug use: No  . Sexual activity: Not on file  Other Topics Concern  . 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      Social Determinants of Health   Financial Resource Strain: Not on file  Food Insecurity: Not on file  Transportation Needs: Not on file  Physical Activity: Not on file  Stress: Not on file  Social Connections: Not on file    Allergies:  Allergies  Allergen Reactions  . Tramadol Nausea Only    Metabolic Disorder Labs: Lab Results  Component Value Date   HGBA1C 5.8 08/21/2020   No results found for: PROLACTIN Lab Results  Component Value Date   CHOL 166 08/21/2020   TRIG 307.0 (H) 08/21/2020   HDL 28.60 (L) 08/21/2020   CHOLHDL 6 08/21/2020   VLDL 61.4 (H) 08/21/2020   Lab Results  Component Value Date   TSH 1.70 08/21/2020   TSH 1.520 06/02/2019    Therapeutic Level Labs: No results found for: LITHIUM No results found for: VALPROATE No components found for:  CBMZ  Current Medications: Current Outpatient Medications  Medication Sig Dispense Refill  . traZODone (DESYREL) 50 MG tablet Take 0.5-1 tablets (25-50 mg total)  by mouth at bedtime as needed for sleep. 30 tablet 1  . Ascorbic Acid (VITAMIN C) 100 MG tablet Take 100 mg by mouth daily.    . Bacillus Coagulans-Inulin (PROBIOTIC) 1-250 BILLION-MG CAPS Take by mouth.    . Biotin 10 MG TABS     . DULoxetine (CYMBALTA) 20 MG capsule Take 1 capsule (20 mg total) by mouth daily. For anxiety and depression. 30 capsule 2  . hydrOXYzine (ATARAX/VISTARIL) 25 MG tablet Take 1-2 tablets (25-50 mg total) by mouth at bedtime as needed for anxiety. And sleep 60 tablet 1  . L-Methylfolate 7.5 MG TABS Take 1 tablet (7.5 mg total) by mouth every morning. 30 tablet 3  . lamoTRIgine (LAMICTAL) 25 MG tablet Take 1 tablet (25 mg  total) by mouth daily. 30 tablet 1  . Melatonin 5 MG CAPS Take by mouth.     No current facility-administered medications for this visit.     Musculoskeletal: Strength & Muscle Tone: UTA Gait & Station: UTA Patient leans: N/A  Psychiatric Specialty Exam: Review of Systems  Psychiatric/Behavioral: Positive for sleep disturbance.       Irritability  All other systems reviewed and are negative.   There were no vitals taken for this visit.There is no height or weight on file to calculate BMI.  General Appearance: Casual  Eye Contact:  Fair  Speech:  Clear and Coherent  Volume:  Normal  Mood:  Irritability  Affect:  Congruent  Thought Process:  Goal Directed and Descriptions of Associations: Intact  Orientation:  Full (Time, Place, and Person)  Thought Content: Logical   Suicidal Thoughts:  No  Homicidal Thoughts:  No  Memory:  Immediate;   Fair Recent;   Fair Remote;   Fair  Judgement:  Fair  Insight:  Fair  Psychomotor Activity:  Normal  Concentration:  Concentration: Fair and Attention Span: Fair  Recall:  [employer] of Knowledge: Fair  Language: Fair  Akathisia:  No  Handed:  Right  AIMS (if indicated): UTA  Assets:  Communication Skills Desire for Improvement Housing Social Support  ADL's:  Intact  Cognition: WNL   Sleep:  Restless   Screenings: GAD-7   Flowsheet Row Video Visit from 08/09/2020 in [facility] Office Visit from 10/09/2017 in [facility]  Total GAD-7 Score 14 11    PHQ2-9   Flowsheet Row Video Visit from 01/08/2021 in [facility] Video Visit from 08/09/2020 in [facility] Office Visit from 10/09/2017 in [facility] Visit from 03/30/2015 in [facility] [place]  PHQ-2 Total Score 2 6 4 4   PHQ-9 Total Score 10 23 19 20     Flowsheet Row Video Visit from 01/08/2021 in [facility]  C-SSRS RISK CATEGORY No Risk       Assessment and Plan: KAMONI DEPREE is a 40 year old Caucasian female, employed, lives in [place], has a history of bipolar disorder, GAD was evaluated by telemedicine today.  Patient is biologically predisposed given her family history, history of trauma.  Patient with psychosocial stressors of job related stressors.  Patient is currently making progress although continues to struggle with sleep.  Plan as noted below.  Plan Bipolar disorder type II mixed-improving Continue Lamictal 25 mg p.o. daily.  Although she was prescribed 25 mg twice a day she has been taking it only once a day. Cymbalta 20 mg p.o. daily Deplin 7.5 mg p.o. daily for folic acid conversion reduction.  GAD-improving Cymbalta 20 mg p.o. daily Hydroxyzine 25-50 mg p.o. nightly as needed  Insomnia-restless Patient advised to hold hydroxyzine the days that she takes Zyrtec. Hydroxyzine 25-50 mg p.o. nightly as needed Start trazodone 25 to 50 mg p.o. nightly as needed  GeneSight testing results were reviewed while making medication changes.  Follow-up in clinic in 3 weeks or sooner if needed.  This note was generated in part or whole with voice recognition software. Voice recognition is usually quite accurate but there are  transcription errors that can and very often do occur. I apologize for any typographical errors that were not detected and corrected.        24, MD 01/08/2021, 9:13 PM

## 2021-01-08 NOTE — Patient Instructions (Signed)
Trazodone Tablets What is this medicine? TRAZODONE (TRAZ oh done) is used to treat depression. This medicine may be used for other purposes; ask your health care provider or pharmacist if you have questions. COMMON BRAND NAME(S): Desyrel What should I tell my health care provider before I take this medicine? They need to know if you have any of these conditions:  attempted suicide or thinking about it  bipolar disorder  bleeding problems  glaucoma  heart disease, or previous heart attack  irregular heart beat  kidney or liver disease  low levels of sodium in the blood  an unusual or allergic reaction to trazodone, other medicines, foods, dyes or preservatives  pregnant or trying to get pregnant  breast-feeding How should I use this medicine? Take this medicine by mouth with a glass of water. Follow the directions on the prescription label. Take this medicine shortly after a meal or a light snack. Take your medicine at regular intervals. Do not take your medicine more often than directed. Do not stop taking this medicine suddenly except upon the advice of your doctor. Stopping this medicine too quickly may cause serious side effects or your condition may worsen. A special MedGuide will be given to you by the pharmacist with each prescription and refill. Be sure to read this information carefully each time. Talk to your pediatrician regarding the use of this medicine in children. Special care may be needed. Overdosage: If you think you have taken too much of this medicine contact a poison control center or emergency room at once. NOTE: This medicine is only for you. Do not share this medicine with others. What if I miss a dose? If you miss a dose, take it as soon as you can. If it is almost time for your next dose, take only that dose. Do not take double or extra doses. What may interact with this medicine? Do not take this medicine with any of the following  medications:  certain medicines for fungal infections like fluconazole, itraconazole, ketoconazole, posaconazole, voriconazole  cisapride  dronedarone  linezolid  MAOIs like Carbex, Eldepryl, Marplan, Nardil, and Parnate  mesoridazine  methylene blue (injected into a vein)  pimozide  saquinavir  thioridazine This medicine may also interact with the following medications:  alcohol  antiviral medicines for HIV or AIDS  aspirin and aspirin-like medicines  barbiturates like phenobarbital  certain medicines for blood pressure, heart disease, irregular heart beat  certain medicines for depression, anxiety, or psychotic disturbances  certain medicines for migraine headache like almotriptan, eletriptan, frovatriptan, naratriptan, rizatriptan, sumatriptan, zolmitriptan  certain medicines for seizures like carbamazepine and phenytoin  certain medicines for sleep  certain medicines that treat or prevent blood clots like dalteparin, enoxaparin, warfarin  digoxin  fentanyl  lithium  NSAIDS, medicines for pain and inflammation, like ibuprofen or naproxen  other medicines that prolong the QT interval (cause an abnormal heart rhythm) like dofetilide  rasagiline  supplements like St. John's wort, kava kava, valerian  tramadol  tryptophan This list may not describe all possible interactions. Give your health care provider a list of all the medicines, herbs, non-prescription drugs, or dietary supplements you use. Also tell them if you smoke, drink alcohol, or use illegal drugs. Some items may interact with your medicine. What should I watch for while using this medicine? Tell your doctor if your symptoms do not get better or if they get worse. Visit your doctor or health care professional for regular checks on your progress. Because it may take   several weeks to see the full effects of this medicine, it is important to continue your treatment as prescribed by your  doctor. Patients and their families should watch out for new or worsening thoughts of suicide or depression. Also watch out for sudden changes in feelings such as feeling anxious, agitated, panicky, irritable, hostile, aggressive, impulsive, severely restless, overly excited and hyperactive, or not being able to sleep. If this happens, especially at the beginning of treatment or after a change in dose, call your health care professional. You may get drowsy or dizzy. Do not drive, use machinery, or do anything that needs mental alertness until you know how this medicine affects you. Do not stand or sit up quickly, especially if you are an older patient. This reduces the risk of dizzy or fainting spells. Alcohol may interfere with the effect of this medicine. Avoid alcoholic drinks. This medicine may cause dry eyes and blurred vision. If you wear contact lenses you may feel some discomfort. Lubricating drops may help. See your eye doctor if the problem does not go away or is severe. Your mouth may get dry. Chewing sugarless gum, sucking hard candy and drinking plenty of water may help. Contact your doctor if the problem does not go away or is severe. What side effects may I notice from receiving this medicine? Side effects that you should report to your doctor or health care professional as soon as possible:  allergic reactions like skin rash, itching or hives, swelling of the face, lips, or tongue  elevated mood, decreased need for sleep, racing thoughts, impulsive behavior  confusion  fast, irregular heartbeat  feeling faint or lightheaded, falls  feeling agitated, angry, or irritable  loss of balance or coordination  painful or prolonged erections  restlessness, pacing, inability to keep still  suicidal thoughts or other mood changes  tremors  trouble sleeping  seizures  unusual bleeding or bruising Side effects that usually do not require medical attention (report to your doctor  or health care professional if they continue or are bothersome):  change in sex drive or performance  change in appetite or weight  constipation  headache  muscle aches or pains  nausea This list may not describe all possible side effects. Call your doctor for medical advice about side effects. You may report side effects to FDA at 1-800-FDA-1088. Where should I keep my medicine? Keep out of the reach of children. Store at room temperature between 15 and 30 degrees C (59 to 86 degrees F). Protect from light. Keep container tightly closed. Throw away any unused medicine after the expiration date. NOTE: This sheet is a summary. It may not cover all possible information. If you have questions about this medicine, talk to your doctor, pharmacist, or health care provider.  2021 Elsevier/Gold Standard (2020-09-11 14:46:11)  

## 2021-01-18 ENCOUNTER — Other Ambulatory Visit: Payer: Self-pay | Admitting: Psychiatry

## 2021-01-18 DIAGNOSIS — F411 Generalized anxiety disorder: Secondary | ICD-10-CM

## 2021-01-31 ENCOUNTER — Telehealth (INDEPENDENT_AMBULATORY_CARE_PROVIDER_SITE_OTHER): Payer: BC Managed Care – PPO | Admitting: Psychiatry

## 2021-01-31 ENCOUNTER — Other Ambulatory Visit: Payer: Self-pay

## 2021-01-31 DIAGNOSIS — Z5329 Procedure and treatment not carried out because of patient's decision for other reasons: Secondary | ICD-10-CM

## 2021-01-31 NOTE — Progress Notes (Signed)
No response to call or text or video invite  

## 2021-02-27 ENCOUNTER — Encounter: Payer: Self-pay | Admitting: Primary Care

## 2021-02-27 ENCOUNTER — Ambulatory Visit (INDEPENDENT_AMBULATORY_CARE_PROVIDER_SITE_OTHER): Payer: BC Managed Care – PPO | Admitting: Primary Care

## 2021-02-27 VITALS — Ht 69.0 in | Wt 272.0 lb

## 2021-02-27 DIAGNOSIS — J3489 Other specified disorders of nose and nasal sinuses: Secondary | ICD-10-CM | POA: Diagnosis not present

## 2021-02-27 MED ORDER — AZITHROMYCIN 250 MG PO TABS: ORAL_TABLET | ORAL | 0 refills | Status: DC

## 2021-02-27 NOTE — Assessment & Plan Note (Signed)
7 to 8 days of predominantly sinus pressure/allergy symptoms.  No improvement with OTC treatment.  Discussed to avoid use of Sudafed for longer than 5 days.  Given no improvement, coupled with duration of symptoms, agreed to treat for presumed sinusitis.  Prescription for azithromycin course sent to pharmacy.  She will update early next week if no improvement

## 2021-02-27 NOTE — Progress Notes (Signed)
Patient ID: Molly Miller, female    DOB: 01-Sep-1981, 40 y.o.   MRN: [id number]  Virtual visit completed through [employer], a video enabled telemedicine application. Due to national recommendations of social distancing due to COVID-19, a virtual visit is felt to be most appropriate for this patient at this time. Reviewed limitations, risks, security and privacy concerns of performing a virtual visit and the availability of in person appointments. I also reviewed that there may be a patient responsible charge related to this service. The patient agreed to proceed.   Patient location: home Provider location: [place] at [facility], office Persons participating in this virtual visit: patient, provider   If any vitals were documented, they were collected by patient at home unless specified below.    Ht 5\' 9"  (1.753 m)   Wt 272 lb (123.4 kg)   BMI 40.17 kg/m    CC: Sinus pressure Subjective:   HPI: Molly Miller is a 40 y.o. female presenting on 02/27/2021 for Sinus Problem (Sore throat sinus pressure x 8 day )  Ms. Gibbard is a 40 year old female with a history of seasonal allergies who presents today with a chief complaint of sinus pressure.  Symptoms began 7-8 days ago with scratchy throat. She then began to develop nasal congestion, post nasal drip, and sinus pressure. Several days later her ears began to throb.   She's been taking plain Zyrtec, Sudafed, nasal spray without much improvement. She's no worse but no better. She attempts to blow her nose but is not able to expel mucous.       Relevant past medical, surgical, family and social history reviewed and updated as indicated. Interim medical history since our last visit reviewed. Allergies and medications reviewed and updated. Outpatient Medications Prior to Visit  Medication Sig Dispense Refill  . Ascorbic Acid (VITAMIN C) 100 MG tablet Take 100 mg by mouth daily.    . Bacillus Coagulans-Inulin (PROBIOTIC) 1-250  BILLION-MG CAPS Take by mouth.    . Biotin 10 MG TABS     . DULoxetine (CYMBALTA) 20 MG capsule Take 1 capsule (20 mg total) by mouth daily. For anxiety and depression. 30 capsule 2  . L-Methylfolate 7.5 MG TABS Take 1 tablet (7.5 mg total) by mouth every morning. 30 tablet 3  . lamoTRIgine (LAMICTAL) 25 MG tablet Take 1 tablet (25 mg total) by mouth daily. 30 tablet 1  . Melatonin 5 MG CAPS Take by mouth.    . traZODone (DESYREL) 50 MG tablet Take 0.5-1 tablets (25-50 mg total) by mouth at bedtime as needed for sleep. 30 tablet 1  . hydrOXYzine (ATARAX/VISTARIL) 25 MG tablet TAKE 1-2 TABLETS (25-50 MG TOTAL) BY MOUTH AT BEDTIME AS NEEDED FOR ANXIETY. AND SLEEP 60 tablet 1   No facility-administered medications prior to visit.     Per HPI unless specifically indicated in ROS section below Review of Systems  Constitutional: Negative for chills, fatigue and fever.  HENT: Positive for congestion, postnasal drip, sinus pressure and sinus pain.   Respiratory: Negative for cough.   Allergic/Immunologic: Positive for environmental allergies.   Objective:  Ht 5\' 9"  (1.753 m)   Wt 272 lb (123.4 kg)   BMI 40.17 kg/m   Wt Readings from Last 3 Encounters:  02/27/21 272 lb (123.4 kg)  08/21/20 272 lb (123.4 kg)  03/01/20 262 lb (118.8 kg)       Physical exam: Gen: alert, NAD, appears tired, sounds nasally congested.  Pulm: speaks in complete sentences without  increased work of breathing Psych: normal mood, normal thought content      Results for orders placed or performed in visit on 08/21/20  Lipid panel  Result Value Ref Range   Cholesterol 166 0 - 200 mg/dL   Triglycerides 885.0 (H) 0.0 - 149.0 mg/dL   HDL 27.74 (L) >12.87 mg/dL   VLDL 86.7 (H) 0.0 - 67.2 mg/dL   Total CHOL/HDL Ratio 6    NonHDL 137.54   Hemoglobin A1c  Result Value Ref Range   Hgb A1c MFr Bld 5.8 4.6 - 6.5 %  Comprehensive metabolic panel  Result Value Ref Range   Sodium 138 135 - 145 mEq/L   Potassium 4.2  3.5 - 5.1 mEq/L   Chloride 104 96 - 112 mEq/L   CO2 27 19 - 32 mEq/L   Glucose, Bld 88 70 - 99 mg/dL   BUN 16 6 - 23 mg/dL   Creatinine, Ser 0.94 0.40 - 1.20 mg/dL   Total Bilirubin 0.6 0.2 - 1.2 mg/dL   Alkaline Phosphatase 74 39 - 117 U/L   AST 21 0 - 37 U/L   ALT 36 (H) 0 - 35 U/L   Total Protein 6.2 6.0 - 8.3 g/dL   Albumin 4.0 3.5 - 5.2 g/dL   GFR 709.62 >83.66 mL/min   Calcium 8.9 8.4 - 10.5 mg/dL  TSH  Result Value Ref Range   TSH 1.70 0.35 - 4.50 uIU/mL  CBC  Result Value Ref Range   WBC 7.5 4.0 - 10.5 K/uL   RBC 4.88 3.87 - 5.11 Mil/uL   Platelets 233.0 150.0 - 400.0 K/uL   Hemoglobin 13.8 12.0 - 15.0 g/dL   HCT 29.4 76.5 - 46.5 %   MCV 84.3 78.0 - 100.0 fl   MCHC 33.7 30.0 - 36.0 g/dL   RDW 03.5 46.5 - 68.1 %  LDL cholesterol, direct  Result Value Ref Range   Direct LDL 101.0 mg/dL   Assessment & Plan:   Problem List Items Addressed This Visit   None   Visit Diagnoses    Sinus pressure    -  Primary   Relevant Medications   azithromycin (ZITHROMAX) 250 MG tablet       Meds ordered this encounter  Medications  . azithromycin (ZITHROMAX) 250 MG tablet    Sig: Take 2 tablets by mouth today, then 1 tablet daily for 4 additional days.    Dispense:  6 tablet    Refill:  0    Order Specific Question:   Supervising Provider    Answer:   BEDSOLE, AMY E [[id number]]   No orders of the defined types were placed in this encounter.   I discussed the assessment and treatment plan with the patient. The patient was provided an opportunity to ask questions and all were answered. The patient agreed with the plan and demonstrated an understanding of the instructions. The patient was advised to call back or seek an in-person evaluation if the symptoms worsen or if the condition fails to improve as anticipated.  Follow up plan:  Start Azithromycin antibiotics for infection. Take 2 tablets by mouth today, then 1 tablet daily for 4 additional days.  Please update me early  next week if no improvement.  It was a pleasure to see you today!   [name], NP

## 2021-03-06 ENCOUNTER — Other Ambulatory Visit: Payer: Self-pay | Admitting: Psychiatry

## 2021-03-06 DIAGNOSIS — F3181 Bipolar II disorder: Secondary | ICD-10-CM

## 2021-03-06 DIAGNOSIS — F411 Generalized anxiety disorder: Secondary | ICD-10-CM

## 2021-03-14 ENCOUNTER — Other Ambulatory Visit: Payer: Self-pay | Admitting: Psychiatry

## 2021-03-14 DIAGNOSIS — F3181 Bipolar II disorder: Secondary | ICD-10-CM

## 2021-03-14 NOTE — Telephone Encounter (Signed)
Joellen, she needs to be reevaluated for her symptoms. Please have her schedule with either me or another provider.

## 2021-03-16 DIAGNOSIS — H6690 Otitis media, unspecified, unspecified ear: Secondary | ICD-10-CM | POA: Diagnosis not present

## 2021-03-16 DIAGNOSIS — B9689 Other specified bacterial agents as the cause of diseases classified elsewhere: Secondary | ICD-10-CM | POA: Diagnosis not present

## 2021-03-16 DIAGNOSIS — J019 Acute sinusitis, unspecified: Secondary | ICD-10-CM | POA: Diagnosis not present

## 2021-04-16 ENCOUNTER — Other Ambulatory Visit: Payer: Self-pay | Admitting: Primary Care

## 2021-04-16 ENCOUNTER — Other Ambulatory Visit: Payer: Self-pay | Admitting: Psychiatry

## 2021-04-16 DIAGNOSIS — F419 Anxiety disorder, unspecified: Secondary | ICD-10-CM

## 2021-04-16 DIAGNOSIS — F411 Generalized anxiety disorder: Secondary | ICD-10-CM

## 2021-04-16 DIAGNOSIS — F32A Depression, unspecified: Secondary | ICD-10-CM

## 2021-04-16 DIAGNOSIS — F3181 Bipolar II disorder: Secondary | ICD-10-CM

## 2021-05-20 ENCOUNTER — Other Ambulatory Visit: Payer: Self-pay | Admitting: Psychiatry

## 2021-05-20 DIAGNOSIS — G4701 Insomnia due to medical condition: Secondary | ICD-10-CM

## 2021-05-20 DIAGNOSIS — F411 Generalized anxiety disorder: Secondary | ICD-10-CM

## 2021-09-11 ENCOUNTER — Encounter: Payer: Self-pay | Admitting: Psychiatry

## 2021-09-11 ENCOUNTER — Ambulatory Visit (INDEPENDENT_AMBULATORY_CARE_PROVIDER_SITE_OTHER): Payer: BC Managed Care – PPO | Admitting: Psychiatry

## 2021-09-11 ENCOUNTER — Other Ambulatory Visit: Payer: Self-pay

## 2021-09-11 VITALS — BP 142/98 | HR 92 | Temp 97.5°F | Wt 282.4 lb

## 2021-09-11 DIAGNOSIS — G4701 Insomnia due to medical condition: Secondary | ICD-10-CM

## 2021-09-11 DIAGNOSIS — F3181 Bipolar II disorder: Secondary | ICD-10-CM

## 2021-09-11 DIAGNOSIS — R030 Elevated blood-pressure reading, without diagnosis of hypertension: Secondary | ICD-10-CM

## 2021-09-11 DIAGNOSIS — F411 Generalized anxiety disorder: Secondary | ICD-10-CM

## 2021-09-11 MED ORDER — DULOXETINE HCL 20 MG PO CPEP: 20.0000 mg | ORAL_CAPSULE | Freq: Every day | ORAL | 1 refills | Status: DC

## 2021-09-11 MED ORDER — LAMOTRIGINE 25 MG PO TABS: 25.0000 mg | ORAL_TABLET | Freq: Every day | ORAL | 1 refills | Status: DC

## 2021-09-11 MED ORDER — TRAZODONE HCL 50 MG PO TABS: 25.0000 mg | ORAL_TABLET | Freq: Every evening | ORAL | 1 refills | Status: DC | PRN

## 2021-09-11 NOTE — Progress Notes (Signed)
BH MD OP Progress Note  09/11/2021 9:07 AM Molly Miller  MRN:  [id number]  Chief Complaint:  Chief Complaint   Follow-up; Depression; Anxiety    HPI: Molly Miller is a 40 year old Caucasian female, lives with  boyfriend in [place], has a history of bipolar disorder type II, GAD, was evaluated in office today.  Patient's last appointment was on 01/08/2021.  Patient no showed her appointment on 01/31/2021.  Patient today returns reporting she is currently not doing well, has mood swings, feels depressed , has a lot of anxiety.  She has difficulty functioning however she has been pushing herself to go to work.  She is currently at a new job .  Patient ran out of all her medications including lamotrigine and duloxetine.  She reports ever since not being on the medications she has noticed her mood swings as getting worse and worse.  She would like to get back on medications.  She also reports she started having sleep problems and hence had to go back on trazodone and melatonin.  That helps.  Patient denies any suicidality, homicidality or perceptual.  Patient denies any other concerns today.    Visit Diagnosis:    ICD-10-CM   1. Bipolar 2 disorder (HCC)  F31.81 lamoTRIgine (LAMICTAL) 25 MG tablet   depressed with mixed features    2. GAD (generalized anxiety disorder)  F41.1 DULoxetine (CYMBALTA) 20 MG capsule    traZODone (DESYREL) 50 MG tablet    3. Insomnia due to medical condition  G47.01 traZODone (DESYREL) 50 MG tablet   mood    4. Elevated blood pressure reading  R03.0       Past Psychiatric History: Reviewed past psychiatric history from progress note on 08/09/2020.  Past trials of venlafaxine, Prozac, Wellbutrin, Lexapro, Cymbalta, Abilify  Past Medical History:  Past Medical History:  Diagnosis Date   Acne    Anxiety    Depression    Kidney stone     Past Surgical History:  Procedure Laterality Date   CYSTOSCOPY W/ URETERAL STENT PLACEMENT Right  02/14/2016   Procedure: CYSTOSCOPY WITH STENT EXCHANGE;  Surgeon: Hildred Laser, MD;  Location: [facility];  Service: Urology;  Laterality: Right;   CYSTOSCOPY WITH STENT PLACEMENT Right 01/14/2016   Procedure: RIGHT CYSTOSCOPY, RIGHT RETROGRADE, RIGHT URETERAL STENT PLACEMENT;  Surgeon: Crist Fat, MD;  Location: [facility];  Service: Urology;  Laterality: Right;   HOLMIUM LASER APPLICATION Right 02/14/2016   Procedure: HOLMIUM LASER APPLICATION;  Surgeon: Hildred Laser, MD;  Location: [facility];  Service: Urology;  Laterality: Right;   URETEROSCOPY WITH HOLMIUM LASER LITHOTRIPSY Right 02/14/2016   Procedure: URETEROSCOPY WITH HOLMIUM LASER LITHOTRIPSY;  Surgeon: Hildred Laser, MD;  Location: [facility];  Service: Urology;  Laterality: Right;    Family Psychiatric History: Reviewed family psychiatric history from progress note on 08/09/2020  Family History:  Family History  Problem Relation Age of Onset   Stroke Mother    Seizures Mother    Dementia Mother    Cancer Mother        Cervical?   Nephrolithiasis Mother    Hematuria Mother    Depression Mother    Hypertension Father    Arthritis Father    Heart disease Father    Depression Sister     Social History: Reviewed social history from progress note on 08/09/2020 Social History   Socioeconomic History   Marital status: Divorced    Spouse name: Not on file  Number of children: Not on file   Years of education: Not on file   Highest education level: Not on file  Occupational History   Not on file  Tobacco Use   Smoking status: Former    Types: Cigarettes    Quit date: 04/05/2020    Years since quitting: 1.4   Smokeless tobacco: Never   Tobacco comments:    june 2021  Vaping Use   Vaping Use: Every day   Substances: Nicotine  Substance and Sexual Activity   Alcohol use: Yes    Alcohol/week: 0.0 standard drinks    Comment: social   Drug use: No   Sexual activity: Not on file  Other Topics Concern    Not on file  Social History Narrative   Single   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      Social Determinants of Health   Financial Resource Strain: Not on file  Food Insecurity: Not on file  Transportation Needs: Not on file  Physical Activity: Not on file  Stress: Not on file  Social Connections: Not on file    Allergies:  Allergies  Allergen Reactions   Tramadol Nausea Only    Metabolic Disorder Labs: Lab Results  Component Value Date   HGBA1C 5.8 08/21/2020   No results found for: PROLACTIN Lab Results  Component Value Date   CHOL 166 08/21/2020   TRIG 307.0 (H) 08/21/2020   HDL 28.60 (L) 08/21/2020   CHOLHDL 6 08/21/2020   VLDL 61.4 (H) 08/21/2020   Lab Results  Component Value Date   TSH 1.70 08/21/2020   TSH 1.520 06/02/2019    Therapeutic Level Labs: No results found for: LITHIUM No results found for: VALPROATE No components found for:  CBMZ  Current Medications: Current Outpatient Medications  Medication Sig Dispense Refill   Ascorbic Acid (VITAMIN C) 100 MG tablet Take 100 mg by mouth daily.     Bacillus Coagulans-Inulin (PROBIOTIC) 1-250 BILLION-MG CAPS Take by mouth.     Biotin 10 MG TABS      L-Methylfolate 7.5 MG TABS TAKE 1 TABLET (7.5 MG TOTAL) BY MOUTH EVERY MORNING. 30 tablet 3   Melatonin 5 MG CAPS Take by mouth.     DULoxetine (CYMBALTA) 20 MG capsule Take 1 capsule (20 mg total) by mouth daily. For anxiety and depression. 30 capsule 1   lamoTRIgine (LAMICTAL) 25 MG tablet Take 1 tablet (25 mg total) by mouth daily. 30 tablet 1   traZODone (DESYREL) 50 MG tablet Take 0.5-1 tablets (25-50 mg total) by mouth at bedtime as needed for sleep. 30 tablet 1   No current facility-administered medications for this visit.     Musculoskeletal: Strength & Muscle Tone: within normal limits Gait & Station: normal Patient leans: N/A  Psychiatric Specialty Exam: Review of Systems   Psychiatric/Behavioral:  Positive for dysphoric mood and sleep disturbance. The patient is nervous/anxious.   All other systems reviewed and are negative.  Blood pressure (!) 142/98, pulse 92, temperature (!) 97.5 F (36.4 C), temperature source Temporal, weight 282 lb 6.4 oz (128.1 kg).Body mass index is 41.7 kg/m.  General Appearance: Casual  Eye Contact:  Fair  Speech:  Clear and Coherent  Volume:  Normal  Mood:  Anxious and Depressed  Affect:  Congruent  Thought Process:  Goal Directed and Descriptions of Associations: Intact  Orientation:  Full (Time, Place, and Person)  Thought Content: Logical   Suicidal Thoughts:  No  Homicidal Thoughts:  No  Memory:  Immediate;   Fair Recent;   Fair Remote;   Fair  Judgement:  Fair  Insight:  Fair  Psychomotor Activity:  Normal  Concentration:  Concentration: Fair and Attention Span: Fair  Recall:  [employer] of Knowledge: Fair  Language: Fair  Akathisia:  No  Handed:  Right  AIMS (if indicated): done  Assets:  Communication Skills Desire for Improvement Housing  ADL's:  Intact  Cognition: WNL  Sleep:  restless without medications   Screenings: GAD-7    Flowsheet Row Video Visit from 08/09/2020 in [place] Office Visit from 10/09/2017 in [place] at [facility]  Total GAD-7 Score 14 11      PHQ2-9    [place] Visit from 09/11/2021 in [place] Video Visit from 01/08/2021 in [place] Video Visit from 08/09/2020 in [place] Visit from 10/09/2017 in [place] at [facility] Visit from 03/30/2015 in [place] at [place]  PHQ-2 Total Score 3 2 6 4 4   PHQ-9 Total Score 16 10 23 19 20       [place] Office Visit from 09/11/2021 in [place] Video Visit from 01/08/2021 in [place] No Risk No Risk        Assessment and Plan: TRENITY KURIAN is a 40 year old Caucasian female, employed, lives in [place], has a history of bipolar disorder, GAD was evaluated in office today.  Patient is biologically predisposed given family history, history of trauma.  Patient was noncompliant with follow-up appointments, ran out of medications.  Patient is interested in restarting medications.  Discussed plan as noted below.  Plan Bipolar disorder type II mixed-unstable Restart Lamictal 25 mg p.o. daily Restart Cymbalta 20 mg p.o. daily Deplin 7.5 mg p.o. daily for folic acid conversion reduction  GAD-unstable Restart Cymbalta 20 mg p.o. daily Restart hydroxyzine 25-50 mg p.o. nightly as needed  Insomnia-restless Hydroxyzine 25-50 mg p.o. nightly as needed Continue trazodone 25-50 mg p.o. nightly as needed  Elevated blood pressure reading-unstable Will need blood pressure monitoring.  We will continue to reevaluate.  Patient also could follow up with primary care provider if her blood pressure continues to be high.  Genesight testing results were reviewed while making medication changes.  Also discussed dietary modification, exercise.  Patient to follow up with primary care provider for her concerns about weight gain.  She is interested in getting into a weight loss program.  Patient would also benefit from repeating TSH, patient to discuss with primary care provider.  Follow-up in clinic in 1 month or sooner if needed.  This note was generated in part or whole with voice recognition software. Voice recognition is usually quite accurate but there are transcription errors that can and very often do occur. I apologize for any typographical errors that were not detected and corrected.       Ursula Alert, MD 09/11/2021, 9:07 AM

## 2021-10-09 ENCOUNTER — Other Ambulatory Visit: Payer: Self-pay

## 2021-10-09 ENCOUNTER — Encounter: Payer: Self-pay | Admitting: Psychiatry

## 2021-10-09 ENCOUNTER — Telehealth (INDEPENDENT_AMBULATORY_CARE_PROVIDER_SITE_OTHER): Payer: Self-pay | Admitting: Psychiatry

## 2021-10-09 DIAGNOSIS — F3181 Bipolar II disorder: Secondary | ICD-10-CM

## 2021-10-09 DIAGNOSIS — F411 Generalized anxiety disorder: Secondary | ICD-10-CM

## 2021-10-09 DIAGNOSIS — G4701 Insomnia due to medical condition: Secondary | ICD-10-CM

## 2021-10-09 MED ORDER — L-METHYLFOLATE 7.5 MG PO TABS: 7.5000 mg | ORAL_TABLET | Freq: Every morning | ORAL | 3 refills | Status: DC

## 2021-10-09 MED ORDER — LAMOTRIGINE 25 MG PO TABS: 25.0000 mg | ORAL_TABLET | Freq: Two times a day (BID) | ORAL | 1 refills | Status: DC

## 2021-10-09 NOTE — Progress Notes (Signed)
Virtual Visit via Video Note  I connected with Molly Miller on 10/09/21 at  8:30 AM EST by a video enabled telemedicine application and verified that I am speaking with the correct person using two identifiers.  Location Provider Location : [facility] Patient Location : Work  Participants: Patient , Provider    I discussed the limitations of evaluation and management by telemedicine and the availability of in person appointments. The patient expressed understanding and agreed to proceed.   I discussed the assessment and treatment plan with the patient. The patient was provided an opportunity to ask questions and all were answered. The patient agreed with the plan and demonstrated an understanding of the instructions.   The patient was advised to call back or seek an in-person evaluation if the symptoms worsen or if the condition fails to improve as anticipated.                                                             BH MD OP Progress Note  10/09/2021 1:07 PM Molly Miller  MRN:  [id number]  Chief Complaint:  Chief Complaint   Follow-up; Anxiety    HPI: Molly Miller is a 40 year old Caucasian female, lives with boyfriend in [place], has a history of bipolar disorder type II, GAD was evaluated by telemedicine today.  Patient today reports she continues to have mood swings, sadness, anxiety symptoms.  She reports the past 2 weeks since it was the holiday season, it had gotten worse.  She reports it was more situational rather than anything else.  Patient reports work is going well.  She is at her new job at this time.  She is getting used to it.  Patient continues to struggle with sleep.  She reports she has to find the right time to take the trazodone since if she takes it too early she wakes up too early and if she takes it too late she feels groggy when she wakes up in the morning.  She is currently taking half a tablet of trazodone.  She does not like to take melatonin  since it gives her bad dreams.  She also takes Zyrtec at night and hence avoids hydroxyzine as needed.  Patient reports she is interested in change of her sleep medications however she would like to wait until she gets her new health insurance which will happen in 2 weeks or so from now.  Patient is compliant on the Lamictal and Cymbalta.  Denies side effects.  Patient denies any suicidality, homicidality or perceptual disturbances.  Patient denies any other concerns today.  Visit Diagnosis:    ICD-10-CM   1. Bipolar 2 disorder (HCC)  F31.81 lamoTRIgine (LAMICTAL) 25 MG tablet   depressed with mixed features    2. GAD (generalized anxiety disorder)  F41.1 L-Methylfolate 7.5 MG TABS    3. Insomnia due to medical condition  G47.01    mood      Past Psychiatric History: Reviewed past psychiatric history from progress note on 08/09/2020.  Past trials of venlafaxine, Prozac, Wellbutrin, Lexapro, Cymbalta, Abilify  Past Medical History:  Past Medical History:  Diagnosis Date   Acne    Anxiety    Depression    Kidney stone     Past Surgical History:  Procedure Laterality  Date   CYSTOSCOPY W/ URETERAL STENT PLACEMENT Right 02/14/2016   Procedure: CYSTOSCOPY WITH STENT EXCHANGE;  Surgeon: Hildred LaserBrian James Budzyn, MD;  Location: [facility];  Service: Urology;  Laterality: Right;   CYSTOSCOPY WITH STENT PLACEMENT Right 01/14/2016   Procedure: RIGHT CYSTOSCOPY, RIGHT RETROGRADE, RIGHT URETERAL STENT PLACEMENT;  Surgeon: Crist FatBenjamin W Herrick, MD;  Location: [facility];  Service: Urology;  Laterality: Right;   HOLMIUM LASER APPLICATION Right 02/14/2016   Procedure: HOLMIUM LASER APPLICATION;  Surgeon: Hildred LaserBrian James Budzyn, MD;  Location: [facility];  Service: Urology;  Laterality: Right;   URETEROSCOPY WITH HOLMIUM LASER LITHOTRIPSY Right 02/14/2016   Procedure: URETEROSCOPY WITH HOLMIUM LASER LITHOTRIPSY;  Surgeon: Hildred LaserBrian James Budzyn, MD;  Location: [facility];  Service: Urology;  Laterality: Right;    Family  Psychiatric History: Reviewed family psychiatric history from progress note on 08/09/2020  Family History:  Family History  Problem Relation Age of Onset   Stroke Mother    Seizures Mother    Dementia Mother    Cancer Mother        Cervical?   Nephrolithiasis Mother    Hematuria Mother    Depression Mother    Hypertension Father    Arthritis Father    Heart disease Father    Depression Sister     Social History: Reviewed social history from progress note on 08/09/2020 Social History   Socioeconomic History   Marital status: Divorced    Spouse name: Not on file   Number of children: Not on file   Years of education: Not on file   Highest education level: Not on file  Occupational History   Not on file  Tobacco Use   Smoking status: Former    Types: Cigarettes    Quit date: 04/05/2020    Years since quitting: 1.5   Smokeless tobacco: Never   Tobacco comments:    june 2021  Vaping Use   Vaping Use: Every day   Substances: Nicotine  Substance and Sexual Activity   Alcohol use: Yes    Alcohol/week: 0.0 standard drinks    Comment: social   Drug use: No   Sexual activity: Not on file  Other Topics Concern   Not on file  Social History Narrative   Single   Works at [employer]   Enjoys shopping and antique and [employer], gardening.      Social Determinants of Health   Financial Resource Strain: Not on file  Food Insecurity: Not on file  Transportation Needs: Not on file  Physical Activity: Not on file  Stress: Not on file  Social Connections: Not on file    Allergies:  Allergies  Allergen Reactions   Tramadol Nausea Only    Metabolic Disorder Labs: Lab Results  Component Value Date   HGBA1C 5.8 08/21/2020   No results found for: PROLACTIN Lab Results  Component Value Date   CHOL 166 08/21/2020   TRIG 307.0 (H) 08/21/2020   HDL 28.60 (L) 08/21/2020   CHOLHDL 6 08/21/2020   VLDL 61.4 (H) 08/21/2020   Lab Results  Component Value Date    TSH 1.70 08/21/2020   TSH 1.520 06/02/2019    Therapeutic Level Labs: No results found for: LITHIUM No results found for: VALPROATE No components found for:  CBMZ  Current Medications: Current Outpatient Medications  Medication Sig Dispense Refill   Ascorbic Acid (VITAMIN C) 100 MG tablet Take 100 mg by mouth daily.     Bacillus Coagulans-Inulin (PROBIOTIC) 1-250 BILLION-MG CAPS Take  by mouth.     Biotin 10 MG TABS      DULoxetine (CYMBALTA) 20 MG capsule Take 1 capsule (20 mg total) by mouth daily. For anxiety and depression. 30 capsule 1   L-Methylfolate 7.5 MG TABS Take 1 tablet (7.5 mg total) by mouth every morning. 30 tablet 3   lamoTRIgine (LAMICTAL) 25 MG tablet Take 1 tablet (25 mg total) by mouth 2 (two) times daily. 60 tablet 1   Melatonin 5 MG CAPS Take by mouth.     traZODone (DESYREL) 50 MG tablet Take 0.5-1 tablets (25-50 mg total) by mouth at bedtime as needed for sleep. 30 tablet 1   No current facility-administered medications for this visit.     Musculoskeletal: Strength & Muscle Tone:  UTA Gait & Station: normal Patient leans: N/A  Psychiatric Specialty Exam: Review of Systems  Psychiatric/Behavioral:  Positive for dysphoric mood and sleep disturbance. The patient is nervous/anxious.   All other systems reviewed and are negative.  Overall  There were no vitals taken for this visit.There is no height or weight on file to calculate BMI.  General Appearance: Casual  Eye Contact:  Fair  Speech:  Clear and Coherent  Volume:  Normal  Mood:  Anxious and Depressed  Affect:  Congruent  Thought Process:  Goal Directed and Descriptions of Associations: Intact  Orientation:  Full (Time, Place, and Person)  Thought Content: Logical   Suicidal Thoughts:  No  Homicidal Thoughts:  No  Memory:  Immediate;   Fair Recent;   Fair Remote;   Fair  Judgement:  Fair  Insight:  Fair  Psychomotor Activity:  Normal  Concentration:  Concentration: Fair and Attention Span:  Fair  Recall:  [employer] of Knowledge: Fair  Language: Fair  Akathisia:  No  Handed:  Right  AIMS (if indicated): done, 0  Assets:  Communication Skills [employer]/Skills Transportation  ADL's:  Intact  Cognition: WNL  Sleep:  Poor   Screenings: GAD-7    Flowsheet Row Video Visit from 08/09/2020 in [place] Visit from 10/09/2017 in [place] at [facility]  Total GAD-7 Score 14 11      PHQ2-9    [place] Video Visit from 10/09/2021 in [place] Visit from 09/11/2021 in [place] Video Visit from 01/08/2021 in [place] Video Visit from 08/09/2020 in [place] Visit from 10/09/2017 in [place] at [facility]  PHQ-2 Total Score 2 3 2 6 4   PHQ-9 Total Score 10 16 10 23 19       [place] Office Visit from 09/11/2021 in [place] Video Visit from 01/08/2021 in [place] No Risk No Risk        Assessment and Plan: MELE Miller is a 40 year old Caucasian female, employed, lives in [place], has a history of bipolar disorder, GAD was evaluated by telemedicine today.  Patient is currently struggling with mood as well as sleep.  She will benefit from the following plan.  Plan Bipolar disorder type II mixed-unstable Increase lamotrigine to 25 mg p.o. twice daily Cymbalta 20 mg p.o. daily Deplin 7.5 mg p.o. daily for folic acid conversion reduction  GAD-unstable Cymbalta 20 mg p.o. daily Hydroxyzine 25-50 mg p.o. nightly as needed.  Insomnia-unstable Continue trazodone 25-50 mg p.o. nightly as needed for now. Patient is currently trying to get new health  insurance plan and will Biochemist, clinical know about medication changes when she does that.  Follow-up  in clinic in 4 weeks or sooner if needed.  This note was generated in part or whole with voice recognition software. Voice recognition is usually quite accurate but there are transcription errors that can and very often do occur. I apologize for any typographical errors that were not detected and corrected.      Ursula Alert, MD 10/09/2021, 1:07 PM

## 2021-10-16 ENCOUNTER — Other Ambulatory Visit: Payer: Self-pay

## 2021-10-16 ENCOUNTER — Telehealth: Payer: Self-pay | Admitting: Primary Care

## 2021-10-19 ENCOUNTER — Encounter: Payer: Self-pay | Admitting: Primary Care

## 2021-10-19 ENCOUNTER — Other Ambulatory Visit: Payer: Self-pay

## 2021-10-19 ENCOUNTER — Ambulatory Visit (INDEPENDENT_AMBULATORY_CARE_PROVIDER_SITE_OTHER): Payer: 59 | Admitting: Primary Care

## 2021-10-19 DIAGNOSIS — Z6841 Body Mass Index (BMI) 40.0 and over, adult: Secondary | ICD-10-CM

## 2021-10-19 DIAGNOSIS — R7303 Prediabetes: Secondary | ICD-10-CM | POA: Insufficient documentation

## 2021-10-19 LAB — COMPREHENSIVE METABOLIC PANEL
ALT: 60 U/L — ABNORMAL HIGH (ref 0–35)
AST: 37 U/L (ref 0–37)
Albumin: 4.3 g/dL (ref 3.5–5.2)
Alkaline Phosphatase: 77 U/L (ref 39–117)
BUN: 12 mg/dL (ref 6–23)
CO2: 27 mEq/L (ref 19–32)
Calcium: 9.5 mg/dL (ref 8.4–10.5)
Chloride: 102 mEq/L (ref 96–112)
Creatinine, Ser: 0.72 mg/dL (ref 0.40–1.20)
GFR: 104.38 mL/min (ref >60.00)
Glucose, Bld: 84 mg/dL (ref 70–99)
Potassium: 4.2 mEq/L (ref 3.5–5.1)
Sodium: 137 mEq/L (ref 135–145)
Total Bilirubin: 0.7 mg/dL (ref 0.2–1.2)
Total Protein: 7.2 g/dL (ref 6.0–8.3)

## 2021-10-19 LAB — LIPID PANEL
Cholesterol: 183 mg/dL (ref 0–200)
HDL: 35.9 mg/dL — ABNORMAL LOW (ref >39.00)
NonHDL: 147.34
Total CHOL/HDL Ratio: 5
Triglycerides: 256 mg/dL — ABNORMAL HIGH (ref 0.0–149.0)
VLDL: 51.2 mg/dL — ABNORMAL HIGH (ref 0.0–40.0)

## 2021-10-19 LAB — CBC
HCT: 43.7 % (ref 36.0–46.0)
Hemoglobin: 14.3 g/dL (ref 12.0–15.0)
MCHC: 32.7 g/dL (ref 30.0–36.0)
MCV: 83.3 fl (ref 78.0–100.0)
Platelets: 294 10*3/uL (ref 150.0–400.0)
RBC: 5.25 Mil/uL — ABNORMAL HIGH (ref 3.87–5.11)
RDW: 13.6 % (ref 11.5–15.5)
WBC: 8.1 10*3/uL (ref 4.0–10.5)

## 2021-10-19 LAB — LDL CHOLESTEROL, DIRECT: Direct LDL: 128 mg/dL

## 2021-10-19 LAB — HEMOGLOBIN A1C: Hgb A1c MFr Bld: 5.9 % (ref 4.6–6.5)

## 2021-10-19 NOTE — Progress Notes (Signed)
Subjective:    Patient ID: Molly Miller, female    DOB: 1981-10-22, 40 y.o.   MRN: [id number]  HPI  Molly Miller is a very pleasant 40 y.o. female with a history of anxiety and depression, morbid obesity, prediabetes, hyperlipidemia who presents today to discuss obesity.  Long history of obesity that dates back to her teenage years.   She began to count calories through the My Fitness Pal APP, is eating 1900 calories daily, has been doing so for the last 1 month and hasn't seen improvement.   She's also tweaked her diet by changing from bad carbs to good carbs.   Three months ago she completed a live coaching with a keto dietician, lost some inches but never saw an improvement in her weight. She's also tried weight watchers, Atkins, protein shakes without success.   She was told about Saxenda from a friend so she's curious about trying. She admits that she is an emotional eater and is hoping that Kirke Shaggy will help to prevent binging.   Diet currently consists of:  Breakfast: Protein shakes, english muffin with peanut butter, boiled eggs, [place] bacon wraps Lunch: grilled chicken, brown rice, veggies, salad. Some fast food. Dinner: protein, starch, veggies Snacks: Almonds, pop chips, some veggies  Desserts: Infrequent Beverages: Coffee, un-sweet tea, water  Exercise: She is walking 3 times weekly at work, about 30 minutes at a time.  BP Readings from Last 3 Encounters:  10/19/21 130/88  08/21/20 136/72  03/01/20 134/74      Wt Readings from Last 3 Encounters:  10/19/21 280 lb (127 kg)  02/27/21 272 lb (123.4 kg)  08/21/20 272 lb (123.4 kg)      Review of Systems  Eyes:  Negative for visual disturbance.  Respiratory:  Negative for shortness of breath.   Cardiovascular:  Negative for chest pain.  Neurological:  Negative for dizziness.        Past Medical History:  Diagnosis Date   Acne    Anxiety    Depression    Kidney stone     Social History    Socioeconomic History   Marital status: Divorced    Spouse name: Not on file   Number of children: Not on file   Years of education: Not on file   Highest education level: Not on file  Occupational History   Not on file  Tobacco Use   Smoking status: Former    Types: Cigarettes    Quit date: 04/05/2020    Years since quitting: 1.5   Smokeless tobacco: Never   Tobacco comments:    june 2021  Vaping Use   Vaping Use: Every day   Substances: Nicotine  Substance and Sexual Activity   Alcohol use: Yes    Alcohol/week: 0.0 standard drinks    Comment: social   Drug use: No   Sexual activity: Not on file  Other Topics Concern   Not on file  Social History Narrative   Single   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      Social Determinants of Health   Financial Resource Strain: Not on file  Food Insecurity: Not on file  Transportation Needs: Not on file  Physical Activity: Not on file  Stress: Not on file  Social Connections: Not on file  Intimate Partner Violence: Not on file    Past Surgical History:  Procedure Laterality Date   CYSTOSCOPY W/ URETERAL STENT PLACEMENT Right 02/14/2016  Procedure: CYSTOSCOPY WITH STENT EXCHANGE;  Surgeon: Hildred Laser, MD;  Location: [facility];  Service: Urology;  Laterality: Right;   CYSTOSCOPY WITH STENT PLACEMENT Right 01/14/2016   Procedure: RIGHT CYSTOSCOPY, RIGHT RETROGRADE, RIGHT URETERAL STENT PLACEMENT;  Surgeon: Crist Fat, MD;  Location: [facility];  Service: Urology;  Laterality: Right;   HOLMIUM LASER APPLICATION Right 02/14/2016   Procedure: HOLMIUM LASER APPLICATION;  Surgeon: Hildred Laser, MD;  Location: [facility];  Service: Urology;  Laterality: Right;   URETEROSCOPY WITH HOLMIUM LASER LITHOTRIPSY Right 02/14/2016   Procedure: URETEROSCOPY WITH HOLMIUM LASER LITHOTRIPSY;  Surgeon: Hildred Laser, MD;  Location: [facility];  Service: Urology;  Laterality: Right;     Family History  Problem Relation Age of Onset   Stroke Mother    Seizures Mother    Dementia Mother    Cancer Mother        Cervical?   Nephrolithiasis Mother    Hematuria Mother    Depression Mother    Hypertension Father    Arthritis Father    Heart disease Father    Depression Sister     Allergies  Allergen Reactions   Tramadol Nausea Only    Current Outpatient Medications on File Prior to Visit  Medication Sig Dispense Refill   Ascorbic Acid (VITAMIN C) 100 MG tablet Take 100 mg by mouth daily.     Bacillus Coagulans-Inulin (PROBIOTIC) 1-250 BILLION-MG CAPS Take by mouth.     Biotin 10 MG TABS      DULoxetine (CYMBALTA) 20 MG capsule Take 1 capsule (20 mg total) by mouth daily. For anxiety and depression. 30 capsule 1   L-Methylfolate 7.5 MG TABS Take 1 tablet (7.5 mg total) by mouth every morning. 30 tablet 3   lamoTRIgine (LAMICTAL) 25 MG tablet Take 1 tablet (25 mg total) by mouth 2 (two) times daily. 60 tablet 1   Melatonin 5 MG CAPS Take by mouth.     traZODone (DESYREL) 50 MG tablet Take 0.5-1 tablets (25-50 mg total) by mouth at bedtime as needed for sleep. 30 tablet 1   No current facility-administered medications on file prior to visit.    BP 130/88    Pulse (!) 118    Temp 98.6 F (37 C) (Temporal)    Ht 5\' 9"  (1.753 m)    Wt 280 lb (127 kg)    SpO2 97%    BMI 41.35 kg/m  Objective:   Physical Exam Cardiovascular:     Rate and Rhythm: Normal rate and regular rhythm.  Pulmonary:     Effort: Pulmonary effort is normal.     Breath sounds: Normal breath sounds.  Musculoskeletal:     Cervical back: Neck supple.  Skin:    General: Skin is warm and dry.  Psychiatric:        Mood and Affect: Mood normal.          Assessment & Plan:      This visit occurred during the SARS-CoV-2 public health emergency.  Safety protocols were in place, including screening questions prior to the visit, additional usage of staff PPE, and extensive cleaning of  exam room while observing appropriate contact time as indicated for disinfecting solutions.

## 2021-10-19 NOTE — Patient Instructions (Signed)
Stop by the lab prior to leaving today. I will notify you of your results once received.   Continue to work on [employer].  Continue exercising. You should be getting 150 minutes of moderate intensity exercise weekly.  When you get the Saxenda prescription, start with 0.6 mg once daily for one week, then increase to 1.2 mg daily thereafter.  Set up a follow up visit for 6 weeks.  It was a pleasure to see you today!

## 2021-10-19 NOTE — Assessment & Plan Note (Signed)
Repeat A1C pending.  Will be starting Saxenda for morbid obesity. Await labs.

## 2021-10-19 NOTE — Assessment & Plan Note (Addendum)
Weight gain of 40 pounds since 2016, discussed this today.  Also with history of prediabetes, borderline hypertension, hyperlipidemia.   Discussed to continue to work on diet. Increase walking.  Checking labs today including CMP, A1C, lipids. Once labs return, will send in Saxenda to start at 0.6 mg daily x  1-2 weeks, then 1.2 mg weekly thereafter. Potential side effects discussed.   We will also plan to see her back in 6-8 weeks for follow up.

## 2021-10-21 ENCOUNTER — Other Ambulatory Visit: Payer: Self-pay | Admitting: Primary Care

## 2021-10-21 DIAGNOSIS — Z6841 Body Mass Index (BMI) 40.0 and over, adult: Secondary | ICD-10-CM

## 2021-10-21 MED ORDER — SAXENDA 18 MG/3ML ~~LOC~~ SOPN: PEN_INJECTOR | SUBCUTANEOUS | 0 refills | Status: DC

## 2021-10-22 DIAGNOSIS — R7303 Prediabetes: Secondary | ICD-10-CM

## 2021-10-23 ENCOUNTER — Telehealth: Payer: Self-pay

## 2021-10-23 NOTE — Telephone Encounter (Signed)
Received fax from insurance to call to process request. Informed by insurance not covered medication. Patient can get savings card on  [email or web address]://www.novocare.com/saxenda/cost-[email or web address]=000870101

## 2021-10-23 NOTE — Telephone Encounter (Signed)
Noted, do I need to send her notification of this?

## 2021-10-24 ENCOUNTER — Other Ambulatory Visit: Payer: Self-pay | Admitting: Psychiatry

## 2021-10-24 DIAGNOSIS — F411 Generalized anxiety disorder: Secondary | ICD-10-CM

## 2021-10-24 NOTE — Telephone Encounter (Signed)
Gave pt info below, she said that she would go on and do the copay card

## 2021-10-24 NOTE — Telephone Encounter (Signed)
Left message to return call to our office.  

## 2021-11-07 ENCOUNTER — Other Ambulatory Visit: Payer: Self-pay

## 2021-11-07 ENCOUNTER — Telehealth (INDEPENDENT_AMBULATORY_CARE_PROVIDER_SITE_OTHER): Payer: Self-pay | Admitting: Psychiatry

## 2021-11-07 ENCOUNTER — Encounter: Payer: Self-pay | Admitting: Psychiatry

## 2021-11-07 DIAGNOSIS — F411 Generalized anxiety disorder: Secondary | ICD-10-CM

## 2021-11-07 DIAGNOSIS — F3181 Bipolar II disorder: Secondary | ICD-10-CM

## 2021-11-07 DIAGNOSIS — G4701 Insomnia due to medical condition: Secondary | ICD-10-CM

## 2021-11-07 NOTE — Progress Notes (Signed)
Virtual Visit via Video Note  I connected with Molly Miller on 11/07/21 at  8:30 AM EST by a video enabled telemedicine application and verified that I am speaking with the correct person using two identifiers.  Location Provider Location : [facility] Patient Location : Work  Participants: Patient , Provider   I discussed the limitations of evaluation and management by telemedicine and the availability of in person appointments. The patient expressed understanding and agreed to proceed.   I discussed the assessment and treatment plan with the patient. The patient was provided an opportunity to ask questions and all were answered. The patient agreed with the plan and demonstrated an understanding of the instructions.   The patient was advised to call back or seek an in-person evaluation if the symptoms worsen or if the condition fails to improve as anticipated.    [place] MD OP Progress Note  11/07/2021 8:56 AM [name]  MRN:  [id number]  Chief Complaint:  Chief Complaint   Follow-up; Anxiety    HPI: Molly Miller is a 41 year old Caucasian female, lives with her boyfriend in [place], has a history of bipolar disorder type II, GAD was evaluated by telemedicine today.  Patient today reports due to insurance issues she had to wait to increase the dosage of her Lamictal.  She increased the dosage a week and a half ago to Lamictal 25 mg twice a day.  She did have some nausea initially however that has improved.  She currently denies any other side effects.  She wants to give the medication more time.  Reports mood symptoms slightly improving.  She had a good holiday season and is currently back to work.  Work is going well.  Reports sleep is improving.  She is currently getting around 5 hours of sleep which is an improvement for her.  She takes only 1/4 pill of the trazodone 50 mg.  She is not taking the hydroxyzine since she is on zyrtec. She is able to sleep for 5 hours or so.  She  wants to continue to work on her sleep hygiene.  Patient denies any suicidality, homicidality or perceptual disturbances.  Patient denies any other concerns today.  Visit Diagnosis:    ICD-10-CM   1. Bipolar 2 disorder (HCC)  F31.81    depressed with mixed features    2. GAD (generalized anxiety disorder)  F41.1     3. Insomnia due to medical condition  G47.01    mood      Past Psychiatric History: Reviewed past psychiatric history from progress note on 08/09/2020.  Past trials of venlafaxine, Prozac, Wellbutrin, Lexapro, Cymbalta, Abilify  Past Medical History:  Past Medical History:  Diagnosis Date   Acne    Anxiety    Depression    Kidney stone     Past Surgical History:  Procedure Laterality Date   CYSTOSCOPY W/ URETERAL STENT PLACEMENT Right 02/14/2016   Procedure: CYSTOSCOPY WITH STENT EXCHANGE;  Surgeon: Nickie Retort, MD;  Location: [facility];  Service: Urology;  Laterality: Right;   CYSTOSCOPY WITH STENT PLACEMENT Right 01/14/2016   Procedure: RIGHT CYSTOSCOPY, RIGHT RETROGRADE, RIGHT URETERAL STENT PLACEMENT;  Surgeon: Ardis Hughs, MD;  Location: [facility];  Service: Urology;  Laterality: Right;   HOLMIUM LASER APPLICATION Right [id number]   Procedure: HOLMIUM LASER APPLICATION;  Surgeon: Nickie Retort, MD;  Location: [facility];  Service: Urology;  Laterality: Right;   URETEROSCOPY WITH HOLMIUM LASER LITHOTRIPSY Right 02/14/2016   Procedure: URETEROSCOPY  WITH HOLMIUM LASER LITHOTRIPSY;  Surgeon: Nickie Retort, MD;  Location: [facility];  Service: Urology;  Laterality: Right;    Family Psychiatric History: Reviewed family psychiatric history from progress note on 08/09/2020  Family History:  Family History  Problem Relation Age of Onset   Stroke Mother    Seizures Mother    Dementia Mother    Cancer Mother        Cervical?   Nephrolithiasis Mother    Hematuria Mother    Depression Mother    Hypertension Father    Arthritis Father    Heart  disease Father    Depression Sister     Social History: Reviewed social history from progress note on 08/09/2020 Social History   Socioeconomic History   Marital status: Divorced    Spouse name: Not on file   Number of children: Not on file   Years of education: Not on file   Highest education level: Not on file  Occupational History   Not on file  Tobacco Use   Smoking status: Former    Types: Cigarettes    Quit date: 04/05/2020    Years since quitting: 1.5   Smokeless tobacco: Never   Tobacco comments:    june 2021  Vaping Use   Vaping Use: Every day   Substances: Nicotine  Substance and Sexual Activity   Alcohol use: Yes    Alcohol/week: 0.0 standard drinks    Comment: social   Drug use: No   Sexual activity: Not on file  Other Topics Concern   Not on file  Social History Narrative   Single   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      Social Determinants of Health   Financial Resource Strain: Not on file  Food Insecurity: Not on file  Transportation Needs: Not on file  Physical Activity: Not on file  Stress: Not on file  Social Connections: Not on file    Allergies:  Allergies  Allergen Reactions   Tramadol Nausea Only    Metabolic Disorder Labs: Lab Results  Component Value Date   HGBA1C 5.9 10/19/2021   No results found for: PROLACTIN Lab Results  Component Value Date   CHOL 183 10/19/2021   TRIG 256.0 (H) 10/19/2021   HDL 35.90 (L) 10/19/2021   CHOLHDL 5 10/19/2021   VLDL 51.2 (H) 10/19/2021   Lab Results  Component Value Date   TSH 1.70 08/21/2020   TSH 1.520 06/02/2019    Therapeutic Level Labs: No results found for: LITHIUM No results found for: VALPROATE No components found for:  CBMZ  Current Medications: Current Outpatient Medications  Medication Sig Dispense Refill   Ascorbic Acid (VITAMIN C) 100 MG tablet Take 100 mg by mouth daily.     Bacillus Coagulans-Inulin (PROBIOTIC)  1-250 BILLION-MG CAPS Take by mouth.     Biotin 10 MG TABS      DULoxetine (CYMBALTA) 20 MG capsule TAKE 1 CAPSULE BY MOUTH EVERY DAY FOR ANXIETY AND DEPRESSION 30 capsule 1   L-Methylfolate 7.5 MG TABS Take 1 tablet (7.5 mg total) by mouth every morning. 30 tablet 3   lamoTRIgine (LAMICTAL) 25 MG tablet Take 1 tablet (25 mg total) by mouth 2 (two) times daily. 60 tablet 1   Liraglutide -Weight Management (SAXENDA) 18 MG/3ML SOPN Inject 0.6 mg daily into the skin for 1 week, then increase to 1.2 mg daily thereafter. 15 mL 0   Melatonin 5 MG CAPS  Take by mouth.     traZODone (DESYREL) 50 MG tablet Take 0.5-1 tablets (25-50 mg total) by mouth at bedtime as needed for sleep. 30 tablet 1   No current facility-administered medications for this visit.     Musculoskeletal: Strength & Muscle Tone:  UTA Gait & Station:  Seated Patient leans: N/A  Psychiatric Specialty Exam: Review of Systems  Psychiatric/Behavioral:  Positive for dysphoric mood and sleep disturbance.   All other systems reviewed and are negative.  There were no vitals taken for this visit.There is no height or weight on file to calculate BMI.  General Appearance: Casual  Eye Contact:  Good  Speech:  Clear and Coherent  Volume:  Normal  Mood:  Depressed  Affect:  Congruent  Thought Process:  Goal Directed and Descriptions of Associations: Intact  Orientation:  Full (Time, Place, and Person)  Thought Content: Logical   Suicidal Thoughts:  No  Homicidal Thoughts:  No  Memory:  Immediate;   Fair Recent;   Fair Remote;   Fair  Judgement:  Fair  Insight:  Fair  Psychomotor Activity:  Normal  Concentration:  Concentration: Fair and Attention Span: Fair  Recall:  [employer] of Knowledge: Fair  Language: Fair  Akathisia:  No  Handed:  Right  AIMS (if indicated): not done  Assets:  Communication Skills Desire for Improvement Housing Intimacy Social Support Transportation  ADL's:  Intact  Cognition: WNL  Sleep:    improving   Screenings: GAD-7    Flowsheet Row Video Visit from 08/09/2020 in [place] Office Visit from 10/09/2017 in [place] at [facility]  Total GAD-7 Score 14 11      PHQ2-9    Flowsheet Row Video Visit from 10/09/2021 in [place] Visit from 09/11/2021 in [place] Video Visit from 01/08/2021 in [place] Video Visit from 08/09/2020 in [facility] Visit from 10/09/2017 in [place] at [place]  PHQ-2 Total Score 2 3 2 6 4   PHQ-9 Total Score 10 16 10 23 19       [place] Office Visit from 09/11/2021 in [place] Video Visit from 01/08/2021 in [place] No Risk No Risk        Assessment and Plan: SAPHIA LAFORTE is a 41 year old Caucasian female, employed, lives in [place], has a history of bipolar disorder, GAD was evaluated by telemedicine today.  Patient is currently improving with regards to her sleep, tolerating the lamotrigine for her mood.  Plan as noted below.  Plan Bipolar disorder type II mixed-improving Continue lamotrigine 25 mg p.o. twice daily Cymbalta 20 mg p.o. 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  GAD-improving Cymbalta 20 mg p.o. daily Hydroxyzine 25-50 mg p.o. nightly as needed  Insomnia-improving Trazodone 12.5-50 mg p.o. nightly as needed We will consider medication changes needed in the future. Continue sleep hygiene techniques  Follow-up in clinic in 6 to 8 weeks or sooner if needed.  This note was generated in part or whole with voice recognition software. Voice recognition is usually quite accurate but there are transcription errors that can and very often do occur. I apologize for any typographical errors that were not detected and  corrected.     Ursula Alert, MD 11/07/2021, 8:56 AM

## 2021-11-07 NOTE — Patient Instructions (Signed)
Bupropion; Naltrexone extended-release tablets What is this medication? BUPROPION; NALTREXONE (byoo PROE pee on; nal TREX one) is a combination of two drugs that help you lose weight. This product is used with a reduced calorie diet and exercise. This product can also help you maintain weight loss. This medicine may be used for other purposes; ask your health care provider or pharmacist if you have questions. COMMON BRAND NAME(S): Contrave What should I tell my care team before I take this medication? They need to know if you have any of these conditions: an eating disorder, such as anorexia or bulimia diabetes depression glaucoma head injury heart disease high blood pressure history of drug abuse or alcohol abuse problem history of a tumor or infection of your brain or spine history of heart attack or stroke history of irregular heartbeat if you often drink alcohol kidney disease liver disease low levels of sodium in the blood mental illness seizures suicidal thoughts, plans, or attempt; a previous suicide attempt by you or a family member taken an MAOI like Carbex, Eldepryl, Marplan, Nardil, or Parnate in last 14 days an unusual or allergic reaction to bupropion, naltrexone, other medicines, foods, dyes, or preservatives breast-feeding pregnant or trying to become pregnant How should I use this medication? Take this medicine by mouth with a glass of water. Follow the directions on the prescription label. Do not cut, crush or chew this medicine. Swallow the tablets whole. You can take it with or without food. Do not take with high-fat meals as this may increase your risk of seizures. Take your medicine at regular intervals. Do not take it more often than directed. Do not stop taking except on your doctor's advice. A special MedGuide will be given to you by the pharmacist with each prescription and refill. Be sure to read this information carefully each time. Talk to your pediatrician  regarding the use of this medicine in children. Special care may be needed. Overdosage: If you think you have taken too much of this medicine contact a poison control center or emergency room at once. NOTE: This medicine is only for you. Do not share this medicine with others. What if I miss a dose? If you miss a dose, skip it. Take your next dose at the normal time. Do not take extra or 2 doses at the same time to make up for the missed dose. What may interact with this medication? Do not take this medicine with any of the following medications: any medicines used to stop taking opioids such as methadone or buprenorphine linezolid MAOIs like Carbex, Eldepryl, Marplan, Nardil, and Parnate methylene blue (injected into a vein) often take narcotic medicines for pain or cough other medicines that contain bupropion like Zyban or Wellbutrin This medicine may also interact with the following medications: alcohol certain medicines for blood pressure like metoprolol, propranolol certain medicines for depression, anxiety, or psychotic disturbances certain medicines for HIV or hepatitis certain medicines for irregular heart beat like propafenone, flecainide certain medicines for Parkinson's disease like amantadine, levodopa certain medicines for seizures like carbamazepine, phenytoin, phenobarbital certain medicines for sleep cimetidine clopidogrel cyclophosphamide digoxin disulfiram furazolidone isoniazid nicotine orphenadrine procarbazine steroid medicines like prednisone or cortisone stimulant medicines for attention disorders, weight loss, or to stay awake tamoxifen theophylline thiotepa ticlopidine tramadol warfarin This list may not describe all possible interactions. Give your health care provider a list of all the medicines, herbs, non-prescription drugs, or dietary supplements you use. Also tell them if you smoke, drink alcohol, or use   illegal drugs. Some items may interact  with your medicine. What should I watch for while using this medication? Visit your doctor or healthcare provider for regular checks on your progress. This medicine may cause serious skin reactions. They can happen weeks to months after starting the medicine. Contact your healthcare provider right away if you notice fevers or flu-like symptoms with a rash. The rash may be red or purple and then turn into blisters or peeling of the skin. Or, you might notice a red rash with swelling of the face, lips or lymph nodes in your neck or under your arms. This medicine may affect blood sugar. Ask your healthcare provider if changes in diet or medicines are needed if you have diabetes. Patients and their families should watch out for new or worsening depression or thoughts of suicide. Also watch out for sudden changes in feelings such as feeling anxious, agitated, panicky, irritable, hostile, aggressive, impulsive, severely restless, overly excited and hyperactive, or not being able to sleep. If this happens, especially at the beginning of treatment or after a change in dose, call your healthcare provider. Avoid alcoholic drinks while taking this medicine. Drinking large amounts of alcoholic beverages, using sleeping or anxiety medicines, or quickly stopping the use of these agents while taking this medicine may increase your risk for a seizure. Do not drive or use heavy machinery until you know how this medicine affects you. This medicine can impair your ability to perform these tasks. Women should inform their health care provider if they wish to become pregnant or think they might be pregnant. Losing weight while pregnant is not advised and may cause harm to the unborn child. Talk to your health care provider for more information. What side effects may I notice from receiving this medication? Side effects that you should report to your doctor or health care professional as soon as possible: allergic reactions  like skin rash, itching or hives, swelling of the face, lips, or tongue breathing problems changes in vision confusion elevated mood, decreased need for sleep, racing thoughts, impulsive behavior fast or irregular heartbeat hallucinations, loss of contact with reality increased blood pressure rash, fever, and swollen lymph nodes redness, blistering, peeling, or loosening of the skin, including inside the mouth seizures signs and symptoms of liver injury like dark yellow or brown urine; general ill feeling or flu-like symptoms; light-colored stools; loss of appetite; nausea; right upper belly pain; unusually weak or tired; yellowing of the eyes or skin suicidal thoughts or other mood changes vomiting Side effects that usually do not require medical attention (report to your doctor or health care professional if they continue or are bothersome): constipation headache loss of appetite indigestion, stomach upset tremors This list may not describe all possible side effects. Call your doctor for medical advice about side effects. You may report side effects to FDA at 1-800-FDA-1088. Where should I keep my medication? Keep out of the reach of children. Store at room temperature between 15 and 30 degrees C (59 and 86 degrees F). Throw away any unused medicine after the expiration date. NOTE: This sheet is a summary. It may not cover all possible information. If you have questions about this medicine, talk to your doctor, pharmacist, or health care provider.  2022 Elsevier/Gold Standard (2021-07-10 00:00:00)  

## 2021-11-14 MED ORDER — WEGOVY 0.25 MG/0.5ML ~~LOC~~ SOAJ: 0.2500 mg | SUBCUTANEOUS | 0 refills | Status: DC

## 2021-11-15 ENCOUNTER — Ambulatory Visit: Payer: 59 | Admitting: Primary Care

## 2021-11-16 MED ORDER — OZEMPIC (0.25 OR 0.5 MG/DOSE) 2 MG/1.5ML ~~LOC~~ SOPN: PEN_INJECTOR | SUBCUTANEOUS | 0 refills | Status: DC

## 2021-11-30 ENCOUNTER — Ambulatory Visit: Payer: 59 | Admitting: Primary Care

## 2021-12-05 MED ORDER — LIRAGLUTIDE 18 MG/3ML ~~LOC~~ SOPN: PEN_INJECTOR | SUBCUTANEOUS | 0 refills | Status: DC

## 2021-12-14 NOTE — Telephone Encounter (Signed)
FYI

## 2021-12-20 NOTE — Telephone Encounter (Signed)
Where are we at? Anything?

## 2021-12-21 NOTE — Telephone Encounter (Signed)
Just checked it was denied

## 2021-12-21 NOTE — Telephone Encounter (Signed)
Noted  

## 2021-12-24 NOTE — Telephone Encounter (Signed)
Joellen, see patient's recent MyChart message. Can you contact the pharmacy to see what is going on?  I feel like there is a big miscommunication.

## 2021-12-26 NOTE — Telephone Encounter (Signed)
Called pharmacy had run coverage it is not approving  until patient has had sep down therapy. Patient must try metformin and have adverse reaction or not improving symptoms. Called patient and let know I am sending information to you for next steps. Patient has not taken metformin in the past.

## 2021-12-27 MED ORDER — METFORMIN HCL ER 500 MG PO TB24: 500.0000 mg | ORAL_TABLET | Freq: Every day | ORAL | 0 refills | Status: DC

## 2022-01-02 ENCOUNTER — Telehealth: Payer: Self-pay

## 2022-01-02 DIAGNOSIS — F3181 Bipolar II disorder: Secondary | ICD-10-CM

## 2022-01-02 MED ORDER — LAMOTRIGINE 25 MG PO TABS: 25.0000 mg | ORAL_TABLET | Freq: Two times a day (BID) | ORAL | 0 refills | Status: DC

## 2022-01-02 NOTE — Telephone Encounter (Signed)
receieved fax requesting a refill on the lamotrigine ?

## 2022-01-02 NOTE — Telephone Encounter (Signed)
I have sent Lamictal . ?

## 2022-01-09 ENCOUNTER — Telehealth (INDEPENDENT_AMBULATORY_CARE_PROVIDER_SITE_OTHER): Payer: 59 | Admitting: Psychiatry

## 2022-01-09 ENCOUNTER — Encounter: Payer: Self-pay | Admitting: Psychiatry

## 2022-01-09 ENCOUNTER — Other Ambulatory Visit: Payer: Self-pay

## 2022-01-09 DIAGNOSIS — F411 Generalized anxiety disorder: Secondary | ICD-10-CM

## 2022-01-09 DIAGNOSIS — F3181 Bipolar II disorder: Secondary | ICD-10-CM | POA: Diagnosis not present

## 2022-01-09 DIAGNOSIS — G4701 Insomnia due to medical condition: Secondary | ICD-10-CM

## 2022-01-09 MED ORDER — DULOXETINE HCL 20 MG PO CPEP: ORAL_CAPSULE | ORAL | 0 refills | Status: DC

## 2022-01-09 MED ORDER — LAMOTRIGINE 25 MG PO TABS: 25.0000 mg | ORAL_TABLET | Freq: Two times a day (BID) | ORAL | 0 refills | Status: DC

## 2022-01-09 MED ORDER — TRAZODONE HCL 50 MG PO TABS: 25.0000 mg | ORAL_TABLET | Freq: Every evening | ORAL | 0 refills | Status: DC | PRN

## 2022-01-09 MED ORDER — L-METHYLFOLATE 7.5 MG PO TABS: 7.5000 mg | ORAL_TABLET | Freq: Every morning | ORAL | 3 refills | Status: DC

## 2022-01-09 NOTE — Progress Notes (Signed)
Virtual Visit via Video Note  I connected with Molly Miller on 01/09/22 at  8:30 AM EST by a video enabled telemedicine application and verified that I am speaking with the correct person using two identifiers.  Location Provider Location : [facility] Patient Location : Work  Participants: Patient , Provider    I discussed the limitations of evaluation and management by telemedicine and the availability of in person appointments. The patient expressed understanding and agreed to proceed.   I discussed the assessment and treatment plan with the patient. The patient was provided an opportunity to ask questions and all were answered. The patient agreed with the plan and demonstrated an understanding of the instructions.   The patient was advised to call back or seek an in-person evaluation if the symptoms worsen or if the condition fails to improve as anticipated.   BH MD OP Progress Note  01/09/2022 9:36 AM ELINDA BUNTEN  MRN:  [id number]  Chief Complaint:  Chief Complaint  Patient presents with   Follow-up: 41 year old Caucasian female with history of bipolar type II, GAD was evaluated for medication management.   HPI: Molly Miller is a 41 year old Caucasian female, who lives with her boyfriend in [place], has a history of bipolar type II, GAD, insomnia, was evaluated by telemedicine today.  Patient today reports she had problems filling her Lamictal for a couple of weeks due to an issue at the pharmacy.  Patient however was able to pick it up yesterday and restarted taking it.  Patient reports once she was out of her Lamictal she did notice increased anxiety, irritability.  Now that she is back on the medication her mood symptoms are better.  Patient denies any significant depressive symptoms.  She reports sleep is improving, sleeps around 7 hours or so.  Trazodone also helps.  Reports work is going well.  Denies any suicidality, homicidality or perceptual  disturbances.  Currently on metformin to help with weight loss.  She is also mindful of making lifestyle changes.  Denies any other concerns today.  Visit Diagnosis:    ICD-10-CM   1. Bipolar 2 disorder (HCC)  F31.81 lamoTRIgine (LAMICTAL) 25 MG tablet   depressed with mixed features    2. GAD (generalized anxiety disorder)  F41.1 traZODone (DESYREL) 50 MG tablet    DULoxetine (CYMBALTA) 20 MG capsule    L-Methylfolate 7.5 MG TABS    3. Insomnia due to medical condition  G47.01 traZODone (DESYREL) 50 MG tablet   mood      Past Psychiatric History: Reviewed past psychiatric history from progress note on 08/09/2020.  Past trials of venlafaxine, Prozac, Wellbutrin, Lexapro, Cymbalta, Abilify.  Past Medical History:  Past Medical History:  Diagnosis Date   Acne    Anxiety    Depression    Kidney stone     Past Surgical History:  Procedure Laterality Date   CYSTOSCOPY W/ URETERAL STENT PLACEMENT Right 02/14/2016   Procedure: CYSTOSCOPY WITH STENT EXCHANGE;  Surgeon: Hildred Laser, MD;  Location: [facility];  Service: Urology;  Laterality: Right;   CYSTOSCOPY WITH STENT PLACEMENT Right 01/14/2016   Procedure: RIGHT CYSTOSCOPY, RIGHT RETROGRADE, RIGHT URETERAL STENT PLACEMENT;  Surgeon: Crist Fat, MD;  Location: [facility];  Service: Urology;  Laterality: Right;   HOLMIUM LASER APPLICATION Right 02/14/2016   Procedure: HOLMIUM LASER APPLICATION;  Surgeon: Hildred Laser, MD;  Location: [facility];  Service: Urology;  Laterality: Right;   URETEROSCOPY WITH HOLMIUM LASER LITHOTRIPSY Right 02/14/2016  Procedure: URETEROSCOPY WITH HOLMIUM LASER LITHOTRIPSY;  Surgeon: Hildred Laser, MD;  Location: [facility];  Service: Urology;  Laterality: Right;    Family Psychiatric History: Reviewed family psychiatric history from progress note on 08/09/2020.  Family History:  Family History  Problem Relation Age of Onset   Stroke Mother    Seizures Mother    Dementia Mother     Cancer Mother        Cervical?   Nephrolithiasis Mother    Hematuria Mother    Depression Mother    Hypertension Father    Arthritis Father    Heart disease Father    Depression Sister     Social History: Reviewed social history from progress note on 08/09/2020. Social History   Socioeconomic History   Marital status: Divorced    Spouse name: Not on file   Number of children: Not on file   Years of education: Not on file   Highest education level: Not on file  Occupational History   Not on file  Tobacco Use   Smoking status: Former    Types: Cigarettes    Quit date: 04/05/2020    Years since quitting: 1.7   Smokeless tobacco: Never   Tobacco comments:    june 2021  Vaping Use   Vaping Use: Every day   Substances: Nicotine  Substance and Sexual Activity   Alcohol use: Yes    Alcohol/week: 0.0 standard drinks    Comment: social   Drug use: No   Sexual activity: Not on file  Other Topics Concern   Not on file  Social History Narrative   Single   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      Social Determinants of Health   Financial Resource Strain: Not on file  Food Insecurity: Not on file  Transportation Needs: Not on file  Physical Activity: Not on file  Stress: Not on file  Social Connections: Not on file    Allergies:  Allergies  Allergen Reactions   Tramadol Nausea Only    Metabolic Disorder Labs: Lab Results  Component Value Date   HGBA1C 5.9 10/19/2021   No results found for: PROLACTIN Lab Results  Component Value Date   CHOL 183 10/19/2021   TRIG 256.0 (H) 10/19/2021   HDL 35.90 (L) 10/19/2021   CHOLHDL 5 10/19/2021   VLDL 51.2 (H) 10/19/2021   Lab Results  Component Value Date   TSH 1.70 08/21/2020   TSH 1.520 06/02/2019    Therapeutic Level Labs: No results found for: LITHIUM No results found for: VALPROATE No components found for:  CBMZ  Current Medications: Current Outpatient  Medications  Medication Sig Dispense Refill   Ascorbic Acid (VITAMIN C) 100 MG tablet Take 100 mg by mouth daily.     Bacillus Coagulans-Inulin (PROBIOTIC) 1-250 BILLION-MG CAPS Take by mouth.     Biotin 10 MG TABS      DULoxetine (CYMBALTA) 20 MG capsule TAKE 1 CAPSULE BY MOUTH EVERY DAY FOR ANXIETY AND DEPRESSION 90 capsule 0   L-Methylfolate 7.5 MG TABS Take 1 tablet (7.5 mg total) by mouth every morning. 30 tablet 3   lamoTRIgine (LAMICTAL) 25 MG tablet Take 1 tablet (25 mg total) by mouth 2 (two) times daily. 180 tablet 0   Melatonin 5 MG CAPS Take by mouth.     metFORMIN (GLUCOPHAGE-XR) 500 MG 24 hr tablet Take 1 tablet (500 mg total) by mouth daily with breakfast. 90 tablet  0   traZODone (DESYREL) 50 MG tablet Take 0.5-1 tablets (25-50 mg total) by mouth at bedtime as needed for sleep. 90 tablet 0   No current facility-administered medications for this visit.     Musculoskeletal: Strength & Muscle Tone:  UTA Gait & Station:  Seated Patient leans: N/A  Psychiatric Specialty Exam: Review of Systems  Psychiatric/Behavioral:         Mood swings - improving  All other systems reviewed and are negative.  There were no vitals taken for this visit.There is no height or weight on file to calculate BMI.  General Appearance: Casual  Eye Contact:  Fair  Speech:  Clear and Coherent  Volume:  Normal  Mood:   mood swings - improving  Affect:  Congruent  Thought Process:  Goal Directed and Descriptions of Associations: Intact  Orientation:  Full (Time, Place, and Person)  Thought Content: Logical   Suicidal Thoughts:  No  Homicidal Thoughts:  No  Memory:  Immediate;   Fair Recent;   Fair Remote;   Fair  Judgement:  Fair  Insight:  Fair  Psychomotor Activity:  Normal  Concentration:  Concentration: Fair and Attention Span: Fair  Recall:  [employer] of Knowledge: Fair  Language: Fair  Akathisia:  No  Handed:  Right  AIMS (if indicated): done,0  Assets:  Communication  Skills Desire for Improvement Housing Social Support  ADL's:  Intact  Cognition: WNL  Sleep:  Fair   Screenings: GAD-7    Flowsheet Row Video Visit from 01/09/2022 in [facility] Video Visit from 08/09/2020 in [facility] Office Visit from 10/09/2017 in [facility]  Total GAD-7 Score PHQ2-9    Flowsheet Row Video Visit from 01/09/2022 in [facility] Video Visit from 10/09/2021 in [facility] Office Visit from 09/11/2021 in [facility] Video Visit from 01/08/2021 in [facility] Video Visit from 08/09/2020 in [facility]  PHQ-2 Total Score 0 PHQ-9 Total Score -- Flowsheet Row Video Visit from 01/09/2022 in [facility] Office Visit from 09/11/2021 in [facility] Video Visit from 01/08/2021 in [facility]  C-SSRS RISK CATEGORY No Risk No Risk No Risk        Assessment and Plan: LUISANA LUTZKE is a 41 year old Caucasian female, employed, lives in [place], has a history of bipolar disorder, GAD was evaluated by telemedicine today.  Patient is currently improving.  Plan as noted below.  Plan Bipolar disorder type II mixed-improving Lamictal 25 mg p.o. twice daily.  Patient recently restarted the medication after being noncompliant for 2 weeks.  Discussed with patient that if she misses the Lamictal for more than 2 days , to gradually titrate it by taking 25 mg daily for 2 weeks and increase it to twice a day after that.  Patient aware of risk of Stevens-Johnson syndrome. Cymbalta 20 mg p.o. daily Deplin 7.5 mg p.o. daily for folic acid conversion reduction.  GAD-improving Cymbalta 20 mg p.o. daily Hydroxyzine 25-50 mg p.o. nightly as  needed  Insomnia-stable Trazodone 25 to 50 mg p.o. nightly as needed   Follow-up in clinic in 2 months or sooner if needed.   Consent: Patient/Guardian gives verbal consent for treatment and assignment of benefits for services provided during this visit. Patient/Guardian  expressed understanding and agreed to proceed.   This note was generated in part or whole with voice recognition software. Voice recognition is usually quite accurate but there are transcription errors that can and very often do occur. I apologize for any typographical errors that were not detected and corrected.     Jomarie LongsSaramma Pax Reasoner, MD 01/09/2022, 9:36 AM

## 2022-01-14 ENCOUNTER — Encounter: Payer: Self-pay | Admitting: Emergency Medicine

## 2022-01-14 ENCOUNTER — Other Ambulatory Visit: Payer: Self-pay

## 2022-01-14 ENCOUNTER — Emergency Department: Payer: 59

## 2022-01-14 ENCOUNTER — Emergency Department
Admission: EM | Admit: 2022-01-14 | Discharge: 2022-01-14 | Disposition: A | Discharge status: Home / Self Care | Payer: 59 | Attending: Emergency Medicine | Admitting: Emergency Medicine

## 2022-01-14 DIAGNOSIS — N200 Calculus of kidney: Secondary | ICD-10-CM

## 2022-01-14 DIAGNOSIS — R1031 Right lower quadrant pain: Secondary | ICD-10-CM | POA: Diagnosis present

## 2022-01-14 DIAGNOSIS — N132 Hydronephrosis with renal and ureteral calculous obstruction: Secondary | ICD-10-CM | POA: Diagnosis not present

## 2022-01-14 DIAGNOSIS — N23 Unspecified renal colic: Secondary | ICD-10-CM

## 2022-01-14 LAB — CBC
HCT: 42.4 % (ref 36.0–46.0)
Hemoglobin: 13.6 g/dL (ref 12.0–15.0)
MCH: 26.8 pg (ref 26.0–34.0)
MCHC: 32.1 g/dL (ref 30.0–36.0)
MCV: 83.5 fL (ref 80.0–100.0)
Platelets: 270 10*3/uL (ref 150–400)
RBC: 5.08 MIL/uL (ref 3.87–5.11)
RDW: 12.6 % (ref 11.5–15.5)
WBC: 9.5 10*3/uL (ref 4.0–10.5)
nRBC: 0 % (ref 0.0–0.2)

## 2022-01-14 LAB — BASIC METABOLIC PANEL
Anion gap: 8 (ref 5–15)
BUN: 15 mg/dL (ref 6–20)
CO2: 26 mmol/L (ref 22–32)
Calcium: 8.8 mg/dL — ABNORMAL LOW (ref 8.9–10.3)
Chloride: 104 mmol/L (ref 98–111)
Creatinine, Ser: 0.95 mg/dL (ref 0.44–1.00)
GFR, Estimated: >60 mL/min (ref >60)
Glucose, Bld: 131 mg/dL — ABNORMAL HIGH (ref 70–99)
Potassium: 4.2 mmol/L (ref 3.5–5.1)
Sodium: 138 mmol/L (ref 135–145)

## 2022-01-14 LAB — HEPATIC FUNCTION PANEL
ALT: 58 U/L — ABNORMAL HIGH (ref 0–44)
AST: 36 U/L (ref 15–41)
Albumin: 3.9 g/dL (ref 3.5–5.0)
Alkaline Phosphatase: 77 U/L (ref 38–126)
Bilirubin, Direct: <0.1 mg/dL (ref 0.0–0.2)
Total Bilirubin: 0.4 mg/dL (ref 0.3–1.2)
Total Protein: 7.2 g/dL (ref 6.5–8.1)

## 2022-01-14 LAB — URINALYSIS, ROUTINE W REFLEX MICROSCOPIC
Bacteria, UA: NONE SEEN
Bilirubin Urine: NEGATIVE
Glucose, UA: NEGATIVE mg/dL
Ketones, ur: NEGATIVE mg/dL
Nitrite: NEGATIVE
Protein, ur: 100 mg/dL — AB
Specific Gravity, Urine: 1.025 (ref 1.005–1.030)
pH: 5 (ref 5.0–8.0)

## 2022-01-14 LAB — POC URINE PREG, ED: Preg Test, Ur: NEGATIVE

## 2022-01-14 LAB — LIPASE, BLOOD: Lipase: 33 U/L (ref 11–51)

## 2022-01-14 MED ORDER — FENTANYL CITRATE PF 50 MCG/ML IJ SOSY
50.0000 ug | PREFILLED_SYRINGE | Freq: Once | INTRAMUSCULAR | Status: DC
Start: 2022-01-14 — End: 2022-01-14

## 2022-01-14 MED ORDER — KETOROLAC TROMETHAMINE 30 MG/ML IJ SOLN
15.0000 mg | Freq: Once | INTRAMUSCULAR | Status: AC
  Administered 2022-01-14: 15 mg via INTRAVENOUS
  Filled 2022-01-14: qty 1

## 2022-01-14 MED ORDER — IBUPROFEN 800 MG PO TABS: 800.0000 mg | ORAL_TABLET | Freq: Three times a day (TID) | ORAL | 0 refills | Status: DC | PRN

## 2022-01-14 MED ORDER — TAMSULOSIN HCL 0.4 MG PO CAPS
0.4000 mg | ORAL_CAPSULE | Freq: Once | ORAL | Status: AC
  Administered 2022-01-14: 0.4 mg via ORAL
  Filled 2022-01-14: qty 1

## 2022-01-14 MED ORDER — OXYCODONE-ACETAMINOPHEN 5-325 MG PO TABS: 1.0000 | ORAL_TABLET | ORAL | 0 refills | Status: DC | PRN

## 2022-01-14 MED ORDER — ONDANSETRON 4 MG PO TBDP: 4.0000 mg | ORAL_TABLET | Freq: Three times a day (TID) | ORAL | 0 refills | Status: DC | PRN

## 2022-01-14 MED ORDER — ONDANSETRON HCL 4 MG/2ML IJ SOLN
4.0000 mg | Freq: Once | INTRAMUSCULAR | Status: AC
  Administered 2022-01-14: 4 mg via INTRAVENOUS
  Filled 2022-01-14: qty 2

## 2022-01-14 MED ORDER — TAMSULOSIN HCL 0.4 MG PO CAPS: 0.4000 mg | ORAL_CAPSULE | Freq: Every day | ORAL | 0 refills | Status: DC

## 2022-01-14 NOTE — ED Triage Notes (Signed)
Pt arrived via POV with reports of R flank pain that started today, pt has hx of kidney stones. C/o nausea ?

## 2022-01-14 NOTE — Discharge Instructions (Signed)
You have been seen in the Emergency Department (ED)  Today and was diagnosed with kidney stones. While the stone is traveling through the ureter, which is the tube that carries urine from the kidney to the bladder, you will probably feel pain. The pain may be mild or very severe. You may also have some blood in your urine. As soon as the stone reaches the bladder, any intense pain should go away. If a stone is too large to pass on its own, you may need a medical procedure to help you pass the stone.   As we have discussed, please drink plenty of fluids and use a urinary strainer to attempt to capture the stone.  Please make a follow up appointment with Urology in the next week by calling the number below and bring the stone with you.  Pain control: Take ibuprofen 800mg every 6 hours for the pain. If the pain is not well controlled with ibuprofen you may take one percocet every 4 hours. Do not take tylenol while taking percocet. Please also take your prescribed flomax daily.   Follow-up with your doctor or return to the ER in 12-24 hours if your pain is not well controlled, if you develop pain or burning with urination, or if you develop a fever. Otherwise follow up in 3-5 days with your doctor.  When should you call for help?  Call your doctor now or seek immediate medical care if:  You cannot keep down fluids.  Your pain gets worse.  You have a fever or chills.  You have new or worse pain in your back just below your rib cage (the flank area).  You have new or more blood in your urine. You have pain or burning with urination You are unable to urinate You have abdominal pain  Watch closely for changes in your health, and be sure to contact your doctor if:  You do not get better as expected  How can you care for yourself at home?  Drink plenty of fluids, enough so that your urine is light yellow or clear like water. If you have kidney, heart, or liver disease and have to limit fluids, talk with  your doctor before you increase the amount of fluids you drink.  Take pain medicines exactly as directed. Call your doctor if you think you are having a problem with your medicine.  If the doctor gave you a prescription medicine for pain, take it as prescribed.  If you are not taking a prescription pain medicine, ask your doctor if you can take an over-the-counter medicine. Read and follow all instructions on the label. Your doctor may ask you to strain your urine so that you can collect your kidney stone when it passes. You can use a kitchen strainer or a tea strainer to catch the stone. Store it in a plastic bag until you see your doctor again.  Preventing future kidney stones  Some changes in your diet may help prevent kidney stones. Depending on the cause of your stones, your doctor may recommend that you:  Drink plenty of fluids, enough so that your urine is light yellow or clear like water. If you have kidney, heart, or liver disease and have to limit fluids, talk with your doctor before you increase the amount of fluids you drink.  Limit coffee, tea, and alcohol. Also avoid grapefruit juice.  Do not take more than the recommended daily dose of vitamins C and D.  Avoid antacids such as Gaviscon,   Maalox, Mylanta, or Tums.  Limit the amount of salt (sodium) in your diet.  Eat a balanced diet that is not too high in protein.  Limit foods that are high in a substance called oxalate, which can cause kidney stones. These foods include dark green vegetables, rhubarb, chocolate, wheat bran, nuts, cranberries, and beans.    

## 2022-01-14 NOTE — ED Provider Notes (Signed)
[facility] Provider Note    Event Date/Time   First MD Initiated Contact with Patient 01/14/22 0050     (approximate)   History   Flank Pain   HPI  Molly Miller is a 41 y.o. female  with a history of kidney stone who presents for evaluation of right flank pain.  Patient reports sudden onset of right-sided flank pain that started today.  The pain has been constant, severe, sharp, radiating to the lower abdomen.  Associated with nausea but no vomiting.  No dysuria or hematuria, no abdominal pain, no chest pain or shortness of breath.  Patient had 1 prior kidney stone that needed stenting to pass.      Past Medical History:  Diagnosis Date   Acne    Anxiety    Depression    Kidney stone     Past Surgical History:  Procedure Laterality Date   CYSTOSCOPY W/ URETERAL STENT PLACEMENT Right 02/14/2016   Procedure: CYSTOSCOPY WITH STENT EXCHANGE;  Surgeon: Hildred Laser, MD;  Location: [facility];  Service: Urology;  Laterality: Right;   CYSTOSCOPY WITH STENT PLACEMENT Right 01/14/2016   Procedure: RIGHT CYSTOSCOPY, RIGHT RETROGRADE, RIGHT URETERAL STENT PLACEMENT;  Surgeon: Crist Fat, MD;  Location: [facility];  Service: Urology;  Laterality: Right;   HOLMIUM LASER APPLICATION Right 02/14/2016   Procedure: HOLMIUM LASER APPLICATION;  Surgeon: Hildred Laser, MD;  Location: [facility];  Service: Urology;  Laterality: Right;   URETEROSCOPY WITH HOLMIUM LASER LITHOTRIPSY Right 02/14/2016   Procedure: URETEROSCOPY WITH HOLMIUM LASER LITHOTRIPSY;  Surgeon: Hildred Laser, MD;  Location: [facility];  Service: Urology;  Laterality: Right;     Physical Exam   Triage Vital Signs: ED Triage Vitals  Enc Vitals Group     BP 01/14/22 0018 140/84     Pulse Rate 01/14/22 0018 73     Resp 01/14/22 0018 17     Temp 01/14/22 0018 98 F (36.7 C)     Temp src --      SpO2 01/14/22 0018 99 %     Weight --      Height --      Head Circumference --       Peak Flow --      Pain Score 01/14/22 0014 8     Pain Loc --      Pain Edu? --      Excl. in GC? --     Most recent vital signs: Vitals:   01/14/22 0018 01/14/22 0149  BP: 140/84 (!) 147/106  Pulse: 73 93  Resp: 17 18  Temp: 98 F (36.7 C)   SpO2: 99% 97%     Constitutional: Alert and oriented. Well appearing and in no apparent distress. HEENT:      Head: Normocephalic and atraumatic.         Eyes: Conjunctivae are normal. Sclera is non-icteric.       Mouth/Throat: Mucous membranes are moist.       Neck: Supple with no signs of meningismus. Cardiovascular: Regular rate and rhythm. No murmurs, gallops, or rubs. 2+ symmetrical distal pulses are present in all extremities.  Respiratory: Normal respiratory effort. Lungs are clear to auscultation bilaterally.  Gastrointestinal: Soft, non tender, and non distended with positive bowel sounds. No rebound or guarding. Genitourinary: No CVA tenderness. Musculoskeletal:  No edema, cyanosis, or erythema of extremities. Neurologic: Normal speech and language. Face is symmetric. Moving all extremities. No gross focal neurologic deficits  are appreciated. Skin: Skin is warm, dry and intact. No rash noted. Psychiatric: Mood and affect are normal. Speech and behavior are normal.  ED Results / Procedures / Treatments   Labs (all labs ordered are listed, but only abnormal results are displayed) Labs Reviewed  URINALYSIS, ROUTINE W REFLEX MICROSCOPIC - Abnormal; Notable for the following components:      Result Value   Color, Urine YELLOW (*)    APPearance HAZY (*)    Hgb urine dipstick SMALL (*)    Protein, ur 100 (*)    Leukocytes,Ua SMALL (*)    All other components within normal limits  BASIC METABOLIC PANEL - Abnormal; Notable for the following components:   Glucose, Bld 131 (*)    Calcium 8.8 (*)    All other components within normal limits  HEPATIC FUNCTION PANEL - Abnormal; Notable for the following components:   ALT 58 (*)     All other components within normal limits  CBC  LIPASE, BLOOD  POC URINE PREG, ED     EKG  none   RADIOLOGY I, Nita Sicklearolina Dreama Kuna, attending MD, have personally viewed and interpreted the images obtained during this visit as below:  CT showing a right proximal 8 mm ureteral stone   ___________________________________________________ Interpretation by Radiologist:  CT Renal Stone Study  Result Date: 01/14/2022 CLINICAL DATA:  Flank pain.  Concern for kidney stone. EXAM: CT ABDOMEN AND PELVIS WITHOUT CONTRAST TECHNIQUE: Multidetector CT imaging of the abdomen and pelvis was performed following the standard protocol without IV contrast. RADIATION DOSE REDUCTION: This exam was performed according to the departmental dose-optimization program which includes automated exposure control, adjustment of the mA and/or kV according to patient size and/or use of iterative reconstruction technique. COMPARISON:  Renal ultrasound dated 03/18/2016. FINDINGS: Evaluation of this exam is limited in the absence of intravenous contrast. Lower chest: The visualized lung bases are clear. No intra-abdominal free air or free fluid. Hepatobiliary: Mild fatty liver. No intrahepatic biliary dilatation. The gallbladder is unremarkable. Pancreas: Unremarkable. No pancreatic ductal dilatation or surrounding inflammatory changes. Spleen: Normal in size without focal abnormality. Adrenals/Urinary Tract: The adrenal glands are unremarkable. There is an 8 mm stone at the right ureteropelvic junction with mild right hydronephrosis. There is an 18 mm right renal upper pole calculus as well as several additional smaller nonobstructing calculi in the right kidney. A 3 mm left renal upper pole nonobstructing stone as well as several punctate nonobstructing left renal inferior pole calculi noted. No hydronephrosis on the left. The ureters appear unremarkable. The urinary bladder is collapsed. Stomach/Bowel: There is no bowel  obstruction or active inflammation. The appendix is normal. Vascular/Lymphatic: The abdominal aorta and IVC are unremarkable on this noncontrast CT. No portal venous gas. There is no adenopathy. Reproductive: The uterus and ovaries are grossly unremarkable. No pelvic mass. Other: None Musculoskeletal: No acute or significant osseous findings. IMPRESSION: 1. A 8 mm right UPJ calculus with mild right hydronephrosis. Additional nonobstructing bilateral renal calculi. No hydronephrosis on the left. 2. Mild fatty liver. 3. No bowel obstruction. Normal appendix. Electronically Signed   By: Elgie CollardArash  Radparvar M.D.   On: 01/14/2022 00:49       PROCEDURES:  Critical Care performed: No  Procedures    IMPRESSION / MDM / ASSESSMENT AND PLAN / ED COURSE  I reviewed the triage vital signs and the nursing notes.  41 y.o. female  with a history of kidney stone who presents for evaluation of right flank pain.  Patient is  well-appearing no distress, abdomen soft and nontender, no CVA tenderness.  Ddx: Kidney stone versus pyelonephritis versus gallbladder pathology versus pancreatitis versus UTI versus diverticulitis versus pneumonia versus PE   Plan: CT renal, urinalysis, CBC, chemistry panel, pregnancy test, hepatic panel, lipase.   MEDICATIONS GIVEN IN ED: Medications  tamsulosin (FLOMAX) capsule 0.4 mg (0.4 mg Oral Given 01/14/22 0126)  ketorolac (TORADOL) 30 MG/ML injection 15 mg (15 mg Intravenous Given 01/14/22 0125)  ondansetron (ZOFRAN) injection 4 mg (4 mg Intravenous Given 01/14/22 0125)     ED COURSE: CT confirms a right proximal 8 mm ureteral stone with right-sided hydronephrosis.  No AKI, no sepsis, no overlying urinary tract infection.  Negative pregnancy test.  Patient was given IV Toradol, Flomax, and Zofran and pain is well controlled.  I consulted Dr. Richardo Hanks from urology who will plan to see patient this week for close follow-up as she may need lithotripsy or stenting.  Admission was  considered but felt unnecessary with pain well controlled, no AKI, no overlying UTI.  Patient be discharged home on Percocet, ibuprofen, Flomax, Zofran, and close follow-up with urology.  Discussed my standard return precautions   Consults: Urology   EMR reviewed including last visit with primary care doctor from December 2022 for obesity and prediabetes    FINAL CLINICAL IMPRESSION(S) / ED DIAGNOSES   Final diagnoses:  Kidney stone  Renal colic on right side     Rx / DC Orders   ED Discharge Orders          Ordered    oxyCODONE-acetaminophen (PERCOCET) 5-325 MG tablet  Every 4 hours PRN        01/14/22 0210    ondansetron (ZOFRAN-ODT) 4 MG disintegrating tablet  Every 8 hours PRN        01/14/22 0210    ibuprofen (ADVIL) 800 MG tablet  Every 8 hours PRN        01/14/22 0210    tamsulosin (FLOMAX) 0.4 MG CAPS capsule  Daily        01/14/22 0210    Ambulatory referral to Urology       Comments: Large proximal ureteral stone   01/14/22 0211             Note:  This document was prepared using Dragon voice recognition software and may include unintentional dictation errors.   Please note:  Patient was evaluated in Emergency Department today for the symptoms described in the history of present illness. Patient was evaluated in the context of the global COVID-19 pandemic, which necessitated consideration that the patient might be at risk for infection with the SARS-CoV-2 virus that causes COVID-19. Institutional protocols and algorithms that pertain to the evaluation of patients at risk for COVID-19 are in a state of rapid change based on information released by regulatory bodies including the CDC and federal and state organizations. These policies and algorithms were followed during the patient's care in the ED.  Some ED evaluations and interventions may be delayed as a result of limited staffing during the pandemic.       Don Perking, [place], MD 01/14/22 [phone]

## 2022-01-15 ENCOUNTER — Ambulatory Visit (INDEPENDENT_AMBULATORY_CARE_PROVIDER_SITE_OTHER): Payer: Self-pay | Admitting: Urology

## 2022-01-15 ENCOUNTER — Telehealth: Payer: Self-pay

## 2022-01-15 ENCOUNTER — Other Ambulatory Visit: Payer: Self-pay | Admitting: Urology

## 2022-01-15 ENCOUNTER — Encounter: Payer: Self-pay | Admitting: Urology

## 2022-01-15 VITALS — BP 116/74 | HR 142 | Ht 70.0 in | Wt 273.0 lb

## 2022-01-15 DIAGNOSIS — N201 Calculus of ureter: Secondary | ICD-10-CM

## 2022-01-15 DIAGNOSIS — N200 Calculus of kidney: Secondary | ICD-10-CM

## 2022-01-15 MED ORDER — CEFAZOLIN SODIUM-DEXTROSE 2-4 GM/100ML-% IV SOLN
2.0000 g | INTRAVENOUS | Status: AC
  Administered 2022-01-16: 2 g via INTRAVENOUS

## 2022-01-15 MED ORDER — ORAL CARE MOUTH RINSE
15.0000 mL | Freq: Once | OROMUCOSAL | Status: AC
Start: 2022-01-15 — End: 2022-01-16

## 2022-01-15 MED ORDER — CHLORHEXIDINE GLUCONATE 0.12 % MT SOLN: 15.0000 mL | Freq: Once | OROMUCOSAL | Status: AC

## 2022-01-15 MED ORDER — LACTATED RINGERS IV SOLN: INTRAVENOUS | Status: DC

## 2022-01-15 NOTE — Patient Instructions (Signed)
Laser Therapy for Kidney Stones ?Laser therapy for kidney stones is a procedure to break up small, hard mineral deposits that form in the kidney (kidney stones). The procedure is done using a device that produces a focused beam of light (laser). The laser breaks up kidney stones into pieces that are small enough to be passed out of the body through urination or removed from the body during the procedure. You may need laser therapy if you have kidney stones that are painful or block your urinary tract. ?This procedure is done by inserting a tube (ureteroscope) into your kidney through the urethral opening. The urethra is the part of the body that drains urine from the bladder. In women, the urethra opens above the vaginal opening. In men, the urethra opens at the tip of the penis. The ureteroscope is inserted through the urethra, and surgical instruments are moved through the bladder and the muscular tube that connects the kidney to the bladder (ureter) until they reach the kidney. ?Tell a health care provider about: ?Any allergies you have. ?All medicines you are taking, including vitamins, herbs, eye drops, creams, and over-the-counter medicines. ?Any problems you or family members have had with anesthetic medicines. ?Any blood disorders you have. ?Any surgeries you have had. ?Any medical conditions you have. ?Whether you are pregnant or may be pregnant. ?What are the risks? ?Generally, this is a safe procedure. However, problems may occur, including: ?Infection. ?Bleeding. ?Allergic reactions to medicines. ?Damage to the urethra, bladder, or ureter. ?Urinary tract infection (UTI). ?Narrowing of the urethra (urethral stricture). ?Difficulty passing urine. ?Blockage of the kidney caused by a fragment of kidney stone. ?What happens before the procedure? ?Medicines ?Ask your health care provider about: ?Changing or stopping your regular medicines. This is especially important if you are taking diabetes medicines or  blood thinners. ?Taking medicines such as aspirin and ibuprofen. These medicines can thin your blood. Do not take these medicines unless your health care provider tells you to take them. ?Taking over-the-counter medicines, vitamins, herbs, and supplements. ?Eating and drinking ?Follow instructions from your health care provider about eating and drinking, which may include: ?8 hours before the procedure - stop eating heavy meals or foods, such as meat, fried foods, or fatty foods. ?6 hours before the procedure - stop eating light meals or foods, such as toast or cereal. ?6 hours before the procedure - stop drinking milk or drinks that contain milk. ?2 hours before the procedure - stop drinking clear liquids. ?Staying hydrated ?Follow instructions from your health care provider about hydration, which may include: ?Up to 2 hours before the procedure - you may continue to drink clear liquids, such as water, clear fruit juice, black coffee, and plain tea. ? ?General instructions ?You may have a physical exam before the procedure. You may also have tests, such as imaging tests and blood or urine tests. ?If your ureter is too narrow, your health care provider may place a soft, flexible tube (stent) inside of it. The stent may be placed days or weeks before your laser therapy procedure. ?Plan to have someone take you home from the hospital or clinic. ?If you will be going home right after the procedure, plan to have someone stay with you for 24 hours. ?Do not use any products that contain nicotine or tobacco for at least 4 weeks before the procedure. These products include cigarettes, e-cigarettes, and chewing tobacco. If you need help quitting, ask your health care provider. ?Ask your health care provider: ?How your   surgical site will be marked or identified. ?What steps will be taken to help prevent infection. These may include: ?Removing hair at the surgery site. ?Washing skin with a germ-killing soap. ?Taking antibiotic  medicine. ?What happens during the procedure? ? ?An IV will be inserted into one of your veins. ?You will be given one or more of the following: ?A medicine to help you relax (sedative). ?A medicine to numb the area (local anesthetic). ?A medicine to make you fall asleep (general anesthetic). ?A ureteroscope will be inserted into your urethra. The ureteroscope will send images to a video screen in the operating room to guide your surgeon to the area of your kidney that will be treated. ?A small, flexible tube will be threaded through the ureteroscope and into your bladder and ureter, up to your kidney. ?The laser device will be inserted into your kidney through the tube. Your surgeon will pulse the laser on and off to break up kidney stones. ?A surgical instrument that has a tiny wire basket may be inserted through the tube into your kidney to remove the pieces of broken kidney stone. ?The procedure may vary among health care providers and hospitals. ?What happens after the procedure? ?Your blood pressure, heart rate, breathing rate, and blood oxygen level will be monitored until you leave the hospital or clinic. ?You will be given pain medicine as needed. ?You may continue to receive antibiotics. ?You may have a stent temporarily placed in your ureter. ?Do not drive for 24 hours if you were given a sedative during your procedure. ?You may be given a strainer to collect any stone fragments that you pass in your urine. Your health care provider may have these tested. ?Summary ?Laser therapy for kidney stones is a procedure to break up kidney stones into pieces that are small enough to be passed out of the body through urination or removed during the procedure. ?Follow instructions from your health care provider about eating and drinking before the procedure. ?During the procedure, the ureteroscope will send images to a video screen to guide your surgeon to the area of your kidney that will be treated. ?Do not drive  for 24 hours if you were given a sedative during your procedure. ?This information is not intended to replace advice given to you by your health care provider. Make sure you discuss any questions you have with your health care provider. ?Document Revised: 06/25/2021 Document Reviewed: 06/25/2021 ?Elsevier Patient Education ? 2022 [employer] ? ?Ureteral Stent Implantation ?Ureteral stent implantation is a procedure to insert (implant) a flexible, soft, plastic tube (stent) into a ureter. Ureters are the tube-like parts of the body that drain urine from the kidneys. The stent supports the ureter while it heals and helps to drain urine. You may have a ureteral stent implanted after having a procedure to remove a blockage from the ureter (ureterolysis or pyeloplasty). You may also have a stent implanted to open the flow of urine when you have a blockage caused by a kidney stone, tumor, blood clot, or infection. ?You have two ureters, one on each side of the body. The ureters connect the kidneys to the organ that holds urine until it passes out of the body (bladder). The stent is placed so that one end is in the kidney, and one end is in the bladder. The stent is usually taken out after your ureter has healed. Depending on your condition, you may have a stent for just a few weeks, or you may have   a long-term stent that will need to be replaced every few months. ?Tell a health care provider about: ?Any allergies you have. ?All medicines you are taking, including vitamins, herbs, eye drops, creams, and over-the-counter medicines. ?Any problems you or family members have had with anesthetic medicines. ?Any blood disorders you have. ?Any surgeries you have had. ?Any medical conditions you have. ?Whether you are pregnant or may be pregnant. ?What are the risks? ?Generally, this is a safe procedure. However, problems may occur, including: ?Infection. ?Bleeding. ?Allergic reactions to medicines. ?Damage to other structures  or organs. Tearing (perforation) of the ureter is possible. ?Movement of the stent away from where it is placed during surgery (migration). ?What happens before the procedure? ?Medicines ?Ask your health

## 2022-01-15 NOTE — Progress Notes (Signed)
Surgical Physician Order Form [facility]  ? ?* Scheduling expectation :  Wednesday 3/15 at noon ? ?*Length of Case: 1 hour ? ?*Clearance needed: no ? ?*Anticoagulation Instructions: Hold all anticoagulants ? ?*Aspirin Instructions: Ok to continue all ? ?*Post-op visit Date/Instructions: TBD ? ?*Diagnosis: Right Ureteral Stone ? ?*Procedure: right Ureteroscopy w/laser lithotripsy & stent placement (19622) ? ? ?Additional orders: N/A ? ?-Admit type: OUTpatient ? ?-Anesthesia: General ? ?-VTE Prophylaxis Standing Order SCD?s    ?   ?Other:  ? ?-Standing Lab Orders Per Anesthesia   ? ?Lab other: None ? ?-Standing Test orders EKG/Chest x-ray per Anesthesia      ? ?Test other:  ? ?- Medications: Ancef 2 g ? ?-Other orders:  N/A ? ? ? ?  ? ?

## 2022-01-15 NOTE — H&P (View-Only) (Signed)
? ?01/15/22 ?12:54 PM  ? ?Molly Miller ?03-Feb-1981 ?[id number] ? ?CC: Right ureteral and right renal stone ? ?HPI: ?41 year old female who reports severe onset of right-sided flank pain 2 days ago resulting in an ER visit.  Work-up there showed 7 mm right proximal ureteral stone with larger 1.3 cm upper pole stone, no clinical or laboratory evidence of infection.  She continues to have right-sided flank pain.  She denies any UTI symptoms or fevers or chills. ? ?She had a previous large right-sided stone treated with ureteroscopy in 2017 by Dr. Sherryl Barters, and did well with her stent in place. ? ? ?PMH: ?Past Medical History:  ?Diagnosis Date  ? Acne   ? Anxiety   ? Depression   ? Kidney stone   ? ? ?Surgical History: ?Past Surgical History:  ?Procedure Laterality Date  ? CYSTOSCOPY W/ URETERAL STENT PLACEMENT Right 02/14/2016  ? Procedure: CYSTOSCOPY WITH STENT EXCHANGE;  Surgeon: Hildred Laser, MD;  Location: [facility];  Service: Urology;  Laterality: Right;  ? CYSTOSCOPY WITH STENT PLACEMENT Right 01/14/2016  ? Procedure: RIGHT CYSTOSCOPY, RIGHT RETROGRADE, RIGHT URETERAL STENT PLACEMENT;  Surgeon: Crist Fat, MD;  Location: [facility];  Service: Urology;  Laterality: Right;  ? HOLMIUM LASER APPLICATION Right 02/14/2016  ? Procedure: HOLMIUM LASER APPLICATION;  Surgeon: Hildred Laser, MD;  Location: [facility];  Service: Urology;  Laterality: Right;  ? URETEROSCOPY WITH HOLMIUM LASER LITHOTRIPSY Right 02/14/2016  ? Procedure: URETEROSCOPY WITH HOLMIUM LASER LITHOTRIPSY;  Surgeon: Hildred Laser, MD;  Location: [facility];  Service: Urology;  Laterality: Right;  ? ? ? ?Family History: ?Family History  ?Problem Relation Age of Onset  ? Stroke Mother   ? Seizures Mother   ? Dementia Mother   ? Cancer Mother   ?     Cervical?  ? Nephrolithiasis Mother   ? Hematuria Mother   ? Depression Mother   ? Hypertension Father   ? Arthritis Father   ? Heart disease Father   ? Depression Sister   ? ? ?Social History:   reports that she quit smoking about 21 months ago. Her smoking use included cigarettes. She has never used smokeless tobacco. She reports current alcohol use. She reports that she does not use drugs. ? ?Physical Exam: ?BP 116/74   Pulse (!) 142   Ht 5\' 10"  (1.778 m)   Wt 273 lb (123.8 kg)   LMP 01/04/2022 (Within Days)   BMI 39.17 kg/m?   ? ?Constitutional:  Alert and oriented, appears uncomfortable ?Cardiovascular: No clubbing, cyanosis, or edema. ?Respiratory: Normal respiratory effort, no increased work of breathing. ?GI: Abdomen is soft, nontender, nondistended, no abdominal masses ? ?Laboratory Data: ?UA 11-20 squamous cells, no bacteria, 20-50 WBCs, 20-50 RBCs, small leukocytes, nitrite negative ? ?Pertinent Imaging: ?I have personally viewed and interpreted the CT showing a 7 mm right proximal ureteral stone with larger 1.3 cm right upper pole stone. ? ?Assessment & Plan:   ?41 year old female with 7 mm right proximal ureteral stone and larger 1.3 cm upper pole stone and poorly controlled renal colic, no clinical or laboratory evidence of infection. ? ?We discussed various treatment options for urolithiasis including observation with or without medical expulsive therapy, shockwave lithotripsy (SWL), ureteroscopy and laser lithotripsy with stent placement, and percutaneous nephrolithotomy. ? ?We discussed that management is based on stone size, location, density, patient co-morbidities, and patient preference.  ? ?Stones <36mm in size have a >80% spontaneous passage rate. Data surrounding the use of tamsulosin  for medical expulsive therapy is controversial, but meta analyses suggests it is most efficacious for distal stones between 5-26mm in size. Possible side effects include dizziness/lightheadedness, and retrograde ejaculation. ? ?SWL has a lower stone free rate in a single procedure, but also a lower complication rate compared to ureteroscopy and avoids a stent and associated stent related symptoms.  Possible complications include renal hematoma, steinstrasse, and need for additional treatment. ? ?Ureteroscopy with laser lithotripsy and stent placement has a higher stone free rate than SWL in a single procedure, however increased complication rate including possible infection, ureteral injury, bleeding, and stent related morbidity. Common stent related symptoms include dysuria, urgency/frequency, and flank pain. ? ?After an extensive discussion of the risks and benefits of the above treatment options, using shared decision making the patient would like to proceed with right ureteroscopy, laser lithotripsy, stent placement to address all of her right-sided stones.  Plan to schedule 01/16/2022. ? ?Legrand Rams, MD ?01/15/2022 ? ?[facility] ?[address], [address] ?[place], [place] [address] ?[phone] ? ? ?

## 2022-01-15 NOTE — Progress Notes (Signed)
[facility] Posting Form  ? ?Surgery Date/Time: Date: 01/16/2022 ? ?Surgeon: Dr. Legrand Rams, MD ? ?Surgery Location: Day Surgery ? ?Inpt ( No  )   Outpt (Yes)   Obs ( No  )  ? ?Diagnosis: N20.1 Right Ureteral Stone ? ?-CPT: 52356 ? ?Surgery: Right Ureteroscopy with laser lithotripsy and Stent Placement ? ?Stop Anticoagulations: Yes, may continue ASA ? ?Cardiac/Medical/Pulmonary Clearance needed: no ? ?*Orders entered into EPIC  Date: 01/15/2022   ? ?*Case booked in [place]  Date: 01/15/22 ? ?*Notified pt of Surgery: Date: 01/15/22 ? ?PRE-OP UA & CX: no ? ?*Placed into Prior Authorization Work Que Date: 01/15/22 ? ? ?Assistant/laser/rep:No ? ? ? ? ? ? ? ? ? ? ? ? ? ? ? ?

## 2022-01-15 NOTE — Telephone Encounter (Signed)
I spoke with Molly Miller. We have discussed possible surgery dates and Wednesday March 15th, 2023 was agreed upon by all parties. Patient given information about surgery date, what to expect pre-operatively and post operatively.  ?We discussed that a Pre-Admission Testing office will be calling to set up the pre-op visit that will take place prior to surgery, and that these appointments are typically done over the phone with a Pre-Admissions RN.  ?Informed patient that our office will communicate any additional care to be provided after surgery. Patients questions or concerns were discussed during our call. Advised to call our office should there be any additional information, questions or concerns that arise. Patient verbalized understanding.  ? ?

## 2022-01-15 NOTE — Progress Notes (Signed)
? ?01/15/22 ?12:54 PM  ? ?Molly Miller ?03/25/1981 ?[id number] ? ?CC: Right ureteral and right renal stone ? ?HPI: ?40-year-old female who reports severe onset of right-sided flank pain 2 days ago resulting in an ER visit.  Work-up there showed 7 mm right proximal ureteral stone with larger 1.3 cm upper pole stone, no clinical or laboratory evidence of infection.  She continues to have right-sided flank pain.  She denies any UTI symptoms or fevers or chills. ? ?She had a previous large right-sided stone treated with ureteroscopy in 2017 by Dr. Budzyn, and did well with her stent in place. ? ? ?PMH: ?Past Medical History:  ?Diagnosis Date  ? Acne   ? Anxiety   ? Depression   ? Kidney stone   ? ? ?Surgical History: ?Past Surgical History:  ?Procedure Laterality Date  ? CYSTOSCOPY W/ URETERAL STENT PLACEMENT Right 02/14/2016  ? Procedure: CYSTOSCOPY WITH STENT EXCHANGE;  Surgeon: Maryfer Tauzin James Budzyn, MD;  Location: [facility];  Service: Urology;  Laterality: Right;  ? CYSTOSCOPY WITH STENT PLACEMENT Right 01/14/2016  ? Procedure: RIGHT CYSTOSCOPY, RIGHT RETROGRADE, RIGHT URETERAL STENT PLACEMENT;  Surgeon: Benjamin W Herrick, MD;  Location: [facility];  Service: Urology;  Laterality: Right;  ? HOLMIUM LASER APPLICATION Right 02/14/2016  ? Procedure: HOLMIUM LASER APPLICATION;  Surgeon: Katya Rolston James Budzyn, MD;  Location: [facility];  Service: Urology;  Laterality: Right;  ? URETEROSCOPY WITH HOLMIUM LASER LITHOTRIPSY Right 02/14/2016  ? Procedure: URETEROSCOPY WITH HOLMIUM LASER LITHOTRIPSY;  Surgeon: Aiesha Leland James Budzyn, MD;  Location: [facility];  Service: Urology;  Laterality: Right;  ? ? ? ?Family History: ?Family History  ?Problem Relation Age of Onset  ? Stroke Mother   ? Seizures Mother   ? Dementia Mother   ? Cancer Mother   ?     Cervical?  ? Nephrolithiasis Mother   ? Hematuria Mother   ? Depression Mother   ? Hypertension Father   ? Arthritis Father   ? Heart disease Father   ? Depression Sister   ? ? ?Social History:   reports that she quit smoking about 21 months ago. Her smoking use included cigarettes. She has never used smokeless tobacco. She reports current alcohol use. She reports that she does not use drugs. ? ?Physical Exam: ?BP 116/74   Pulse (!) 142   Ht 5' 10" (1.778 m)   Wt 273 lb (123.8 kg)   LMP 01/04/2022 (Within Days)   BMI 39.17 kg/m?   ? ?Constitutional:  Alert and oriented, appears uncomfortable ?Cardiovascular: No clubbing, cyanosis, or edema. ?Respiratory: Normal respiratory effort, no increased work of breathing. ?GI: Abdomen is soft, nontender, nondistended, no abdominal masses ? ?Laboratory Data: ?UA 11-20 squamous cells, no bacteria, 20-50 WBCs, 20-50 RBCs, small leukocytes, nitrite negative ? ?Pertinent Imaging: ?I have personally viewed and interpreted the CT showing a 7 mm right proximal ureteral stone with larger 1.3 cm right upper pole stone. ? ?Assessment & Plan:   ?40-year-old female with 7 mm right proximal ureteral stone and larger 1.3 cm upper pole stone and poorly controlled renal colic, no clinical or laboratory evidence of infection. ? ?We discussed various treatment options for urolithiasis including observation with or without medical expulsive therapy, shockwave lithotripsy (SWL), ureteroscopy and laser lithotripsy with stent placement, and percutaneous nephrolithotomy. ? ?We discussed that management is based on stone size, location, density, patient co-morbidities, and patient preference.  ? ?Stones <5mm in size have a >80% spontaneous passage rate. Data surrounding the use of tamsulosin   for medical expulsive therapy is controversial, but meta analyses suggests it is most efficacious for distal stones between 5-26mm in size. Possible side effects include dizziness/lightheadedness, and retrograde ejaculation. ? ?SWL has a lower stone free rate in a single procedure, but also a lower complication rate compared to ureteroscopy and avoids a stent and associated stent related symptoms.  Possible complications include renal hematoma, steinstrasse, and need for additional treatment. ? ?Ureteroscopy with laser lithotripsy and stent placement has a higher stone free rate than SWL in a single procedure, however increased complication rate including possible infection, ureteral injury, bleeding, and stent related morbidity. Common stent related symptoms include dysuria, urgency/frequency, and flank pain. ? ?After an extensive discussion of the risks and benefits of the above treatment options, using shared decision making the patient would like to proceed with right ureteroscopy, laser lithotripsy, stent placement to address all of her right-sided stones.  Plan to schedule 01/16/2022. ? ?Legrand Rams, MD ?01/15/2022 ? ?[facility] ?[address], [address] ?[place], [place] [address] ?[phone] ? ? ?

## 2022-01-16 ENCOUNTER — Ambulatory Visit: Payer: 59 | Admitting: Anesthesiology

## 2022-01-16 ENCOUNTER — Other Ambulatory Visit: Payer: Self-pay

## 2022-01-16 ENCOUNTER — Ambulatory Visit
Admission: RE | Admit: 2022-01-16 | Discharge: 2022-01-16 | Disposition: A | Discharge status: Home / Self Care | Payer: 59 | Source: Ambulatory Visit | Attending: Urology | Admitting: Urology

## 2022-01-16 ENCOUNTER — Encounter: Payer: Self-pay | Admitting: Urology

## 2022-01-16 ENCOUNTER — Ambulatory Visit: Payer: 59

## 2022-01-16 ENCOUNTER — Encounter
Admission: RE | Disposition: A | Discharge status: Home / Self Care | Payer: Self-pay | Source: Ambulatory Visit | Attending: Urology

## 2022-01-16 ENCOUNTER — Other Ambulatory Visit: Payer: Self-pay | Admitting: Urology

## 2022-01-16 DIAGNOSIS — R000 Tachycardia, unspecified: Secondary | ICD-10-CM | POA: Diagnosis not present

## 2022-01-16 DIAGNOSIS — N132 Hydronephrosis with renal and ureteral calculous obstruction: Secondary | ICD-10-CM | POA: Insufficient documentation

## 2022-01-16 DIAGNOSIS — N390 Urinary tract infection, site not specified: Secondary | ICD-10-CM | POA: Diagnosis not present

## 2022-01-16 DIAGNOSIS — N201 Calculus of ureter: Secondary | ICD-10-CM

## 2022-01-16 DIAGNOSIS — Z87891 Personal history of nicotine dependence: Secondary | ICD-10-CM | POA: Diagnosis not present

## 2022-01-16 DIAGNOSIS — R509 Fever, unspecified: Secondary | ICD-10-CM | POA: Insufficient documentation

## 2022-01-16 DIAGNOSIS — N202 Calculus of kidney with calculus of ureter: Secondary | ICD-10-CM | POA: Diagnosis not present

## 2022-01-16 HISTORY — PX: CYSTOSCOPY/URETEROSCOPY/HOLMIUM LASER/STENT PLACEMENT: SHX6546

## 2022-01-16 LAB — GLUCOSE, CAPILLARY: Glucose-Capillary: 161 mg/dL — ABNORMAL HIGH (ref 70–99)

## 2022-01-16 LAB — POCT PREGNANCY, URINE: Preg Test, Ur: NEGATIVE

## 2022-01-16 SURGERY — CYSTOSCOPY/URETEROSCOPY/HOLMIUM LASER/STENT PLACEMENT
Anesthesia: General | Laterality: Right

## 2022-01-16 MED ORDER — SODIUM CHLORIDE 0.9 % IR SOLN
Status: DC | PRN
  Administered 2022-01-16: 400 mL

## 2022-01-16 MED ORDER — PROPOFOL 10 MG/ML IV BOLUS
INTRAVENOUS | Status: AC
  Filled 2022-01-16: qty 20

## 2022-01-16 MED ORDER — LACTATED RINGERS IV SOLN: INTRAVENOUS | Status: DC | PRN

## 2022-01-16 MED ORDER — CHLORHEXIDINE GLUCONATE 0.12 % MT SOLN
OROMUCOSAL | Status: AC
  Administered 2022-01-16: 15 mL via OROMUCOSAL
  Filled 2022-01-16: qty 15

## 2022-01-16 MED ORDER — LIDOCAINE HCL URETHRAL/MUCOSAL 2 % EX GEL
CUTANEOUS | Status: AC
  Filled 2022-01-16: qty 10

## 2022-01-16 MED ORDER — DEXAMETHASONE SODIUM PHOSPHATE 10 MG/ML IJ SOLN
INTRAMUSCULAR | Status: DC | PRN
  Administered 2022-01-16: 10 mg via INTRAVENOUS

## 2022-01-16 MED ORDER — PROPOFOL 10 MG/ML IV BOLUS
INTRAVENOUS | Status: DC | PRN
  Administered 2022-01-16: 200 mg via INTRAVENOUS

## 2022-01-16 MED ORDER — FENTANYL CITRATE (PF) 100 MCG/2ML IJ SOLN
INTRAMUSCULAR | Status: AC
  Filled 2022-01-16: qty 2

## 2022-01-16 MED ORDER — ACETAMINOPHEN 10 MG/ML IV SOLN
INTRAVENOUS | Status: AC
  Filled 2022-01-16: qty 100

## 2022-01-16 MED ORDER — LIDOCAINE HCL (CARDIAC) PF 100 MG/5ML IV SOSY
PREFILLED_SYRINGE | INTRAVENOUS | Status: DC | PRN
  Administered 2022-01-16: 100 mg via INTRAVENOUS

## 2022-01-16 MED ORDER — OXYBUTYNIN CHLORIDE ER 10 MG PO TB24: 10.0000 mg | ORAL_TABLET | Freq: Every day | ORAL | 1 refills | Status: DC

## 2022-01-16 MED ORDER — GENTAMICIN SULFATE 40 MG/ML IJ SOLN
3.0000 mg/kg | Freq: Once | INTRAVENOUS | Status: AC
  Administered 2022-01-16: 370 mg via INTRAVENOUS
  Filled 2022-01-16: qty 9.25

## 2022-01-16 MED ORDER — IOHEXOL 180 MG/ML  SOLN
INTRAMUSCULAR | Status: DC | PRN
  Administered 2022-01-16: 3 mL

## 2022-01-16 MED ORDER — SULFAMETHOXAZOLE-TRIMETHOPRIM 800-160 MG PO TABS: 1.0000 | ORAL_TABLET | Freq: Two times a day (BID) | ORAL | 0 refills | Status: DC

## 2022-01-16 MED ORDER — SUCCINYLCHOLINE CHLORIDE 200 MG/10ML IV SOSY
PREFILLED_SYRINGE | INTRAVENOUS | Status: DC | PRN
Start: 2022-01-16 — End: 2022-01-16
  Administered 2022-01-16: 100 mg via INTRAVENOUS

## 2022-01-16 MED ORDER — PHENYLEPHRINE HCL (PRESSORS) 10 MG/ML IV SOLN
INTRAVENOUS | Status: DC | PRN
Start: 2022-01-16 — End: 2022-01-16
  Administered 2022-01-16 (×2): 80 ug via INTRAVENOUS

## 2022-01-16 MED ORDER — ONDANSETRON HCL 4 MG/2ML IJ SOLN
INTRAMUSCULAR | Status: DC | PRN
  Administered 2022-01-16: 4 mg via INTRAVENOUS

## 2022-01-16 MED ORDER — FENTANYL CITRATE (PF) 100 MCG/2ML IJ SOLN
INTRAMUSCULAR | Status: DC | PRN
  Administered 2022-01-16: 100 ug via INTRAVENOUS

## 2022-01-16 MED ORDER — FENTANYL CITRATE (PF) 100 MCG/2ML IJ SOLN: 25.0000 ug | INTRAMUSCULAR | Status: DC | PRN

## 2022-01-16 MED ORDER — OXYCODONE-ACETAMINOPHEN 5-325 MG PO TABS: 1.0000 | ORAL_TABLET | Freq: Four times a day (QID) | ORAL | 0 refills | Status: AC | PRN

## 2022-01-16 MED ORDER — CEFAZOLIN SODIUM-DEXTROSE 2-4 GM/100ML-% IV SOLN
INTRAVENOUS | Status: AC
  Filled 2022-01-16: qty 100

## 2022-01-16 MED ORDER — ACETAMINOPHEN 10 MG/ML IV SOLN
INTRAVENOUS | Status: DC | PRN
Start: 2022-01-16 — End: 2022-01-16
  Administered 2022-01-16: 1000 mg via INTRAVENOUS

## 2022-01-16 MED ORDER — SEVOFLURANE IN SOLN
RESPIRATORY_TRACT | Status: AC
  Filled 2022-01-16: qty 250

## 2022-01-16 MED ORDER — MIDAZOLAM HCL 2 MG/2ML IJ SOLN
INTRAMUSCULAR | Status: AC
  Filled 2022-01-16: qty 2

## 2022-01-16 MED ORDER — ONDANSETRON HCL 4 MG/2ML IJ SOLN: 4.0000 mg | Freq: Once | INTRAMUSCULAR | Status: DC | PRN

## 2022-01-16 MED ORDER — MIDAZOLAM HCL 2 MG/2ML IJ SOLN
INTRAMUSCULAR | Status: DC | PRN
Start: 2022-01-16 — End: 2022-01-16
  Administered 2022-01-16: 2 mg via INTRAVENOUS

## 2022-01-16 SURGICAL SUPPLY — 25 items
BAG DRAIN CYSTO-URO LG1000N (MISCELLANEOUS) ×2 IMPLANT
BRUSH SCRUB EZ 1% IODOPHOR (MISCELLANEOUS) ×2 IMPLANT
CNTNR SPEC 2.5X3XGRAD LEK (MISCELLANEOUS) ×1
CONT SPEC 4OZ STER OR WHT (MISCELLANEOUS) ×1
CONT SPEC 4OZ STRL OR WHT (MISCELLANEOUS) ×1
CONTAINER SPEC 2.5X3XGRAD LEK (MISCELLANEOUS) IMPLANT
DRAPE UTILITY 15X26 TOWEL STRL (DRAPES) ×2 IMPLANT
GAUZE 4X4 16PLY ~~LOC~~+RFID DBL (SPONGE) ×4 IMPLANT
GLOVE SURG UNDER POLY LF SZ7.5 (GLOVE) ×2 IMPLANT
GOWN STRL REUS W/ TWL LRG LVL3 (GOWN DISPOSABLE) ×1 IMPLANT
GOWN STRL REUS W/ TWL XL LVL3 (GOWN DISPOSABLE) ×1 IMPLANT
GOWN STRL REUS W/TWL LRG LVL3 (GOWN DISPOSABLE) ×2
GOWN STRL REUS W/TWL XL LVL3 (GOWN DISPOSABLE) ×2
GUIDEWIRE STR DUAL SENSOR (WIRE) ×2 IMPLANT
INFUSOR MANOMETER BAG 3000ML (MISCELLANEOUS) ×2 IMPLANT
IV NS IRRIG 3000ML ARTHROMATIC (IV SOLUTION) ×2 IMPLANT
KIT TURNOVER CYSTO (KITS) ×2 IMPLANT
PACK CYSTO AR (MISCELLANEOUS) ×2 IMPLANT
SET CYSTO W/LG BORE CLAMP LF (SET/KITS/TRAYS/PACK) ×2 IMPLANT
STENT URET 6FRX26 CONTOUR (STENTS) IMPLANT
STENT URET 6FRX28 CONTOUR (STENTS) ×1 IMPLANT
SURGILUBE 2OZ TUBE FLIPTOP (MISCELLANEOUS) ×2 IMPLANT
SYR 10ML LL (SYRINGE) ×2 IMPLANT
WATER STERILE IRR 1000ML POUR (IV SOLUTION) ×2 IMPLANT
WATER STERILE IRR 500ML POUR (IV SOLUTION) ×2 IMPLANT

## 2022-01-16 NOTE — Op Note (Signed)
Date of procedure: 01/16/22 ? ?Preoperative diagnosis:  ?Right proximal ureteral stone ?Right renal stone ? ?Postoperative diagnosis:  ?Right proximal ureteral stone ?Right renal stone ?UTI ? ?Procedure: ?Cystoscopy, right retrograde pyelogram with intraoperative interpretation, right ureteral stent placement ? ?Surgeon: Nickolas Madrid, MD ? ?Anesthesia: General ? ?Complications: None ? ?Intraoperative findings:  ?Concern for UTI/early sepsis from urinary source with fever of 38 degrees Intra-Op, and purulent drainage from right kidney, opted to place stent for maximal decompression with plan for follow-up right ureteroscopy laser lithotripsy after 2 to 3 weeks of antibiotics ? ?EBL: None ? ?Specimens: Urine for culture ? ?Drains: Right 6 French by 28 cm ureteral stent ? ?Indication: Molly Miller is a 41 y.o. patient with 7 mm right proximal ureteral stone and larger 1.4 cm upper pole stone who presented with poorly controlled renal colic and opted for ureteroscopy.  After reviewing the management options for treatment, they elected to proceed with the above surgical procedure(s). We have discussed the potential benefits and risks of the procedure, side effects of the proposed treatment, the likelihood of the patient achieving the goals of the procedure, and any potential problems that might occur during the procedure or recuperation. Informed consent has been obtained. ? ?Description of procedure: ? ?The patient was taken to the operating room and general anesthesia was induced. SCDs were placed for DVT prophylaxis. The patient was placed in the dorsal lithotomy position, prepped and draped in the usual sterile fashion, and preoperative antibiotics(Ancef) were administered. A preoperative time-out was performed.  ? ?A 21 French rigid cystoscope was used to intubate the urethra and thorough cystoscopy was performed.  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 ? ?A sensor wire was used to intubate the right ureteral orifice and this was advanced up to the kidney under fluoroscopic vision.  There was purulent drainage alongside the wire.  At this point I discussed with anesthesia and she had a temperature of 38.3 and was tachycardic to 120 and I opted for stent placement to maximize decompression in the setting of a suspected infected and obstructed system.  Gentamicin was given in addition to Ancef. ? ?A 5 French access catheter advanced easily over the wire up to the kidney under fluoroscopic vision, and a hydronephrotic drip of purulent urine was noted.  A small amount of contrast was injected for retrograde pyelogram which showed no extravasation and moderate hydronephrosis. ? ?A 6 [place] by 26 cm ureteral stent advanced easily over the wire and there was a curl in the lower pole, as well as under direct vision of the bladder.  There was purulent drainage from the side ports of the stent.  On fluoroscopy this appeared a little bit short and I was concerned the upper curl could migrate down the ureter.  The stent was grasped and pulled to the meatus and a sensor wire readvanced into the upper pole under fluoroscopic vision.  A 6 French by 28 cm ureteral stent was then advanced over the wire with a curl in the renal pelvis, as well as under direct vision the bladder.  Purulent urine drained through the side ports of the stent.  The bladder was drained and this completed our procedure. ? ?Disposition: Stable to PACU ? ?Plan: ?-Monitor in PACU, consider discharge home this afternoon with Bactrim versus admission overnight if persistent tachycardia/fever or any hypotension ?-We will arrange right ureteroscopy, laser lithotripsy, stent change in 2 to 3 weeks once  infection treated ? ?Nickolas Madrid, MD ? ?

## 2022-01-16 NOTE — Progress Notes (Signed)
Surgical Physician Order Form [facility] [place] ? ?* Scheduling expectation :  2 to 3 weeks ? ?*Length of Case: 1.5 hours ? ?*Clearance needed: no ? ?*Anticoagulation Instructions: May continue all anticoagulants ? ?*Aspirin Instructions: Ok to continue all ? ?*Post-op visit Date/Instructions: TBD ? ?*Diagnosis: Right Ureteral Stone ? ?*Procedure: right  Ureteroscopy w/laser lithotripsy & stent exchange (84696) ? ? ?Additional orders: N/A ? ?-Admit type: OUTpatient ? ?-Anesthesia: General ? ?-VTE Prophylaxis Standing Order SCD?s    ?   ?Other:  ? ?-Standing Lab Orders Per Anesthesia   ? ?Lab other: UA&Urine Culture ? ?-Standing Test orders EKG/Chest x-ray per Anesthesia      ? ?Test other:  ? ?- Medications:  Ancef 2gm IV ? ?-Other orders:  N/A ? ? ? ?  ? ?

## 2022-01-16 NOTE — Anesthesia Preprocedure Evaluation (Signed)
Anesthesia Evaluation  ?Patient identified by MRN, date of birth, ID band ?Patient awake ? ? ? ?Reviewed: ?Allergy & Precautions, H&P , NPO status , Patient's Chart, lab work & pertinent test results, reviewed documented beta blocker date and time  ? ?Airway ?Mallampati: II ? ?TM Distance: >3 FB ?Neck ROM: full ? ? ? Dental ? ?(+) Teeth Intact ?  ?Pulmonary ?neg pulmonary ROS, Patient abstained from smoking., former smoker,  ?  ?Pulmonary exam normal ? ? ? ? ? ? ? Cardiovascular ?Exercise Tolerance: Good ?negative cardio ROS ?Normal cardiovascular exam ?Rate:Normal ? ? ?  ?Neuro/Psych ?PSYCHIATRIC DISORDERS Anxiety Depression Bipolar Disorder negative neurological ROS ?   ? GI/Hepatic ?negative GI ROS, Neg liver ROS,   ?Endo/Other  ? ? Renal/GU ?Renal disease  ?negative genitourinary ?  ?Musculoskeletal ? ? Abdominal ?  ?Peds ? Hematology ?negative hematology ROS ?(+)   ?Anesthesia Other Findings ? ? Reproductive/Obstetrics ?negative OB ROS ? ?  ? ? ? ? ? ? ? ? ? ? ? ? ? ?  ?  ? ? ? ? ? ? ? ? ?Anesthesia Physical ?Anesthesia Plan ? ?ASA: 2 ? ?Anesthesia Plan: General LMA  ? ?Post-op Pain Management:   ? ?Induction:  ? ?PONV Risk Score and Plan:  ? ?Airway Management Planned:  ? ?Additional Equipment:  ? ?Intra-op Plan:  ? ?Post-operative Plan:  ? ?Informed Consent: I have reviewed the patients History and Physical, chart, labs and discussed the procedure including the risks, benefits and alternatives for the proposed anesthesia with the patient or authorized representative who has indicated his/her understanding and acceptance.  ? ? ? ? ? ?Plan Discussed with: CRNA ? ?Anesthesia Plan Comments:   ? ? ? ? ? ? ?Anesthesia Quick Evaluation ? ?

## 2022-01-16 NOTE — Transfer of Care (Signed)
Immediate Anesthesia Transfer of Care Note ? ?Patient: Molly Miller ? ?Procedure(s) Performed: CYSTOSCOPY/URETEROSCOPY/ RIGHT URETERAL STENT PLACEMENT (Right) ? ?Patient Location: PACU ? ?Anesthesia Type:General ? ?Level of Consciousness: drowsy ? ?Airway & Oxygen Therapy: Patient Spontanous Breathing and Patient connected to face mask oxygen ? ?Post-op Assessment: Report given to RN and Post -op Vital signs reviewed and stable ? ?Post vital signs: Reviewed and stable ? ?Last Vitals:  ?Vitals Value Taken Time  ?BP 127/70 01/16/22 1241  ?Temp    ?Pulse 117 01/16/22 1244  ?Resp 20 01/16/22 1244  ?SpO2 100 % 01/16/22 1244  ?Vitals shown include unvalidated device data. ? ?Last Pain:  ?Vitals:  ? 01/16/22 1022  ?PainSc: 8   ?   ? ?  ? ?Complications: No notable events documented. ?

## 2022-01-16 NOTE — Anesthesia Procedure Notes (Signed)
Procedure Name: Intubation ?Date/Time: 01/16/2022 12:11 PM ?Performed by: Beverely Low, CRNA ?Pre-anesthesia Checklist: Patient identified, Patient being monitored, Timeout performed, Emergency Drugs available and Suction available ?Patient Re-evaluated:Patient Re-evaluated prior to induction ?Oxygen Delivery Method: Circle system utilized ?Preoxygenation: Pre-oxygenation with 100% oxygen ?Induction Type: IV induction ?Ventilation: Mask ventilation without difficulty ?Laryngoscope Size: 3 and McGraph ?Grade View: Grade I ?Tube type: Oral ?Tube size: 7.0 mm ?Number of attempts: 1 ?Airway Equipment and Method: Stylet ?Placement Confirmation: ETT inserted through vocal cords under direct vision, positive ETCO2 and breath sounds checked- equal and bilateral ?Secured at: 21 cm ?Tube secured with: Tape ?Dental Injury: Teeth and Oropharynx as per pre-operative assessment  ? ? ? ? ?

## 2022-01-16 NOTE — Discharge Instructions (Signed)
AMBULATORY SURGERY  ?DISCHARGE INSTRUCTIONS ? ? ?The drugs that you were given will stay in your system until tomorrow so for the next 24 hours you should not: ? ?Drive an automobile ?Make any legal decisions ?Drink any alcoholic beverage ? ? ?You may resume regular meals tomorrow.  Today it is better to start with liquids and gradually work up to solid foods. ? ?You may eat anything you prefer, but it is better to start with liquids, then soup and crackers, and gradually work up to solid foods. ? ? ?Please notify your doctor immediately if you have any unusual bleeding, trouble breathing, redness and pain at the surgery site, drainage, fever, or pain not relieved by medication. ? ? ? ?Additional Instructions: ? ? ? ?Please contact your physician with any problems or Same Day Surgery at [phone], Monday through Friday 6 am to 4 pm, or [place] [facility] [place] Main number at [phone].  ?

## 2022-01-16 NOTE — Progress Notes (Signed)
Notified Dr. Richardo Hanks of patients vital signs and temperature is improved. 100.2. Gentamycin IV ordered and given and per patient  is feeling much better. Denies pain. A&o x 4. ? ?

## 2022-01-16 NOTE — Interval H&P Note (Signed)
UROLOGY H&P UPDATE ? ?Agree with prior H&P dated 01/15/22, 7 mm right proximal ureteral stone and larger 1.3 cm upper pole stone and poorly controlled renal colic, here today for ureteroscopy and laser lithotripsy ? ?Cardiac: RRR ?Lungs: CTA bilaterally ? ?Laterality: Right ?Procedure: Right ureteroscopy, laser lithotripsy, stent placement ? ?Urine: 11-20 squamous cells, no bacteria, 20-50 WBCs, 20-50 RBCs, small leukocytes, nitrite negative ? ?We specifically discussed the risks ureteroscopy including bleeding, infection/sepsis, stent related symptoms including flank pain/urgency/frequency/incontinence/dysuria, ureteral injury, inability to access stone, or need for staged or additional procedures. ? ? ? ?Sondra Come, MD ?01/16/2022 ? ? ? ?

## 2022-01-17 ENCOUNTER — Encounter: Payer: Self-pay | Admitting: Urology

## 2022-01-17 ENCOUNTER — Telehealth: Payer: Self-pay

## 2022-01-17 NOTE — Telephone Encounter (Signed)
I spoke with Molly Miller. We have discussed possible surgery dates and Friday  was agreed upon by all parties. Patient given information about surgery date, what to expect pre-operatively and post operatively.  ?We discussed that a Pre-Admission Testing office will be calling to set up the pre-op visit that will take place prior to surgery, and that these appointments are typically done over the phone with a Pre-Admissions RN.  ?Informed patient that our office will communicate any additional care to be provided after surgery. Patients questions or concerns were discussed during our call. Advised to call our office should there be any additional information, questions or concerns that arise. Patient verbalized understanding.  ? ?

## 2022-01-17 NOTE — Progress Notes (Signed)
 Urological Surgery Posting Form  ? ?Surgery Date/Time: Date: 02/01/2022 ? ?Surgeon: Dr. Legrand Rams, MD ? ?Surgery Location: Day Surgery ? ?Inpt ( No  )   Outpt (Yes)   Obs ( No  )  ? ?Diagnosis: N20.1 Right Ureteral Stone ? ?-CPT: 52356 ? ?Surgery: Right Ureteroscopy with laser lithotripsy and stent exchange ? ?Stop Anticoagulations: No and may continue ASA ? ?Cardiac/Medical/Pulmonary Clearance needed: no ? ?*Orders entered into EPIC  Date: 01/17/22  ? ?*Case booked in [place]  Date: 01/17/22 ? ?*Notified pt of Surgery: Date: 01/17/22 ? ?PRE-OP UA & CX: Yes, obtained yesterday 01/16/2022 ? ?*Placed into Prior Authorization Work Angela Nevin Date: 01/17/22 ? ? ?Assistant/laser/rep:No ? ? ? ? ? ? ? ? ? ? ? ? ? ? ? ?

## 2022-01-19 LAB — URINE CULTURE: Culture: >=100000 — AB

## 2022-01-23 NOTE — Anesthesia Postprocedure Evaluation (Signed)
Anesthesia Post Note ? ?Patient: Molly Miller ? ?Procedure(s) Performed: CYSTOSCOPY/URETEROSCOPY/ RIGHT URETERAL STENT PLACEMENT (Right) ? ?Patient location during evaluation: PACU ?Anesthesia Type: General ?Level of consciousness: awake and alert ?Pain management: pain level controlled ?Vital Signs Assessment: post-procedure vital signs reviewed and stable ?Respiratory status: spontaneous breathing, nonlabored ventilation, respiratory function stable and patient connected to nasal cannula oxygen ?Cardiovascular status: blood pressure returned to baseline and stable ?Postop Assessment: no apparent nausea or vomiting ?Anesthetic complications: no ? ? ?No notable events documented. ? ? ?Last Vitals:  ?Vitals:  ? 01/16/22 1400 01/16/22 1410  ?BP: 137/74 123/79  ?Pulse: 96 (!) 103  ?Resp:  16  ?Temp:  36.7 ?C  ?SpO2: 96% 97%  ?  ?Last Pain:  ?Vitals:  ? 01/17/22 0843  ?TempSrc:   ?PainSc: 3   ? ? ?  ?  ?  ?  ?  ?  ? ?Molli Barrows ? ? ? ? ?

## 2022-01-28 ENCOUNTER — Other Ambulatory Visit: Payer: Self-pay

## 2022-01-28 ENCOUNTER — Other Ambulatory Visit
Admission: RE | Admit: 2022-01-28 | Discharge: 2022-01-28 | Disposition: A | Discharge status: Home / Self Care | Payer: 59 | Source: Ambulatory Visit | Attending: Urology | Admitting: Urology

## 2022-01-28 HISTORY — DX: Prediabetes: R73.03

## 2022-01-28 NOTE — Patient Instructions (Addendum)
?Your procedure is scheduled on: Friday February 01, 2022. ?Report to Day Surgery inside [facility] 2nd floor, stop by admissions desk before getting on elevator.  ?To find out your arrival time please call [phone] between 1PM - 3PM on Thursday January 31, 2022. ? ?Remember: Instructions that are not followed completely may result in serious medical risk,  ?up to and including death, or upon the discretion of your surgeon and anesthesiologist your  ?surgery may need to be rescheduled.  ? ?  _X__ 1. Do not eat food or drink fluids after midnight the night before your procedure. ?                No chewing gum or hard candies.  ? ?__X__2.  On the morning of surgery brush your teeth with toothpaste and water, you ?               may rinse your mouth with mouthwash if you wish.  Do not swallow any toothpaste or mouthwash. ?   ? _X__ 3.  No Alcohol for 24 hours before or after surgery. ? ? _X__ 4.  Do Not Smoke or use e-cigarettes For 24 Hours Prior to Your Surgery. ?                Do not use any chewable tobacco products for at least 6 hours prior to ?                Surgery. ? ?_X__  5.  Do not use any recreational drugs (marijuana, cocaine, heroin, ecstasy, MDMA or other) ?               For at least one week prior to your surgery.  Combination of these drugs with anesthesia ?               May have life threatening results. ? ?____  6.  Bring all medications with you on the day of surgery if instructed.  ? ?__X__  7.  Notify your doctor if there is any change in your medical condition  ?    (cold, fever, infections). ?    ?Do not wear jewelry, make-up, hairpins, clips or nail polish. ?Do not wear lotions, powders, or perfumes. You may wear deodorant. ?Do not shave 48 hours prior to surgery. Men may shave face and neck. ?Do not bring valuables to the hospital.   ? ?[facility] is not responsible for any belongings or valuables. ? ?Contacts, dentures or bridgework may not be worn into  surgery. ?Leave your suitcase in the car. After surgery it may be brought to your room. ?For patients admitted to the hospital, discharge time is determined by your ?treatment team. ?  ?Patients discharged the day of surgery will not be allowed to drive home.   ?Make arrangements for someone to be with you for the first 24 hours of your ?Same Day Discharge. ? ? ?__X__ Take these medicines the morning of surgery with A SIP OF WATER:  ? ? 1. famotidine (PEPCID) 10 MG  ? 2. lamoTRIgine (LAMICTAL) 25 MG ? 3. oxybutynin (DITROPAN XL) 10 MG  ? 4. ? 5. ? 6. ? ?____ Fleet Enema (as directed)  ? ?____ Use CHG Soap (or wipes) as directed ? ?____ Use Benzoyl Peroxide Gel as instructed ? ?____ Use inhalers on the day of surgery ? ?__X__ Stop metformin 2 days prior to surgery   ? ?____ Take 1/2 of usual insulin dose the night  before surgery. No insulin the morning ?         of surgery.  ? ?____ Call your PCP, cardiologist, or Pulmonologist if taking Coumadin/Plavix/aspirin and ask when to stop before your surgery.  ? ?__X__ One Week prior to surgery- Stop Anti-inflammatories such as Ibuprofen, Aleve, Advil, Motrin, meloxicam (MOBIC), diclofenac, etodolac, ketorolac, Toradol, Daypro, piroxicam, Goody's or BC powders. OK TO USE TYLENOL IF NEEDED ?  ?__X__ Stop supplements until after surgery.   ? ?____ Bring C-Pap to the hospital.  ? ? ?If you have any questions regarding your pre-procedure instructions,  ?Please call Pre-admit Testing at [phone] ?

## 2022-01-29 NOTE — Addendum Note (Signed)
Addended by: Letta Kocher A on: 01/29/2022 10:04 AM ? ? Modules accepted: Orders ? ?

## 2022-01-31 MED ORDER — ORAL CARE MOUTH RINSE: 15.0000 mL | Freq: Once | OROMUCOSAL | Status: AC

## 2022-01-31 MED ORDER — CHLORHEXIDINE GLUCONATE 0.12 % MT SOLN: 15.0000 mL | Freq: Once | OROMUCOSAL | Status: AC

## 2022-01-31 MED ORDER — LACTATED RINGERS IV SOLN: INTRAVENOUS | Status: DC

## 2022-01-31 MED ORDER — CEFAZOLIN SODIUM-DEXTROSE 2-4 GM/100ML-% IV SOLN: 2.0000 g | INTRAVENOUS | Status: DC

## 2022-02-01 ENCOUNTER — Encounter: Payer: Self-pay | Admitting: Urology

## 2022-02-01 ENCOUNTER — Ambulatory Visit
Admission: RE | Admit: 2022-02-01 | Discharge: 2022-02-01 | Disposition: A | Discharge status: Home / Self Care | Payer: 59 | Source: Ambulatory Visit | Attending: Urology | Admitting: Urology

## 2022-02-01 ENCOUNTER — Ambulatory Visit: Payer: 59 | Admitting: Certified Registered Nurse Anesthetist

## 2022-02-01 ENCOUNTER — Encounter
Admission: RE | Disposition: A | Discharge status: Home / Self Care | Payer: Self-pay | Source: Ambulatory Visit | Attending: Urology

## 2022-02-01 ENCOUNTER — Ambulatory Visit: Payer: 59

## 2022-02-01 ENCOUNTER — Other Ambulatory Visit: Payer: Self-pay

## 2022-02-01 DIAGNOSIS — F419 Anxiety disorder, unspecified: Secondary | ICD-10-CM | POA: Insufficient documentation

## 2022-02-01 DIAGNOSIS — N202 Calculus of kidney with calculus of ureter: Secondary | ICD-10-CM | POA: Diagnosis present

## 2022-02-01 DIAGNOSIS — Z7984 Long term (current) use of oral hypoglycemic drugs: Secondary | ICD-10-CM | POA: Insufficient documentation

## 2022-02-01 DIAGNOSIS — Z6841 Body Mass Index (BMI) 40.0 and over, adult: Secondary | ICD-10-CM | POA: Insufficient documentation

## 2022-02-01 DIAGNOSIS — N201 Calculus of ureter: Secondary | ICD-10-CM

## 2022-02-01 DIAGNOSIS — Z87891 Personal history of nicotine dependence: Secondary | ICD-10-CM | POA: Diagnosis not present

## 2022-02-01 DIAGNOSIS — F319 Bipolar disorder, unspecified: Secondary | ICD-10-CM | POA: Diagnosis not present

## 2022-02-01 DIAGNOSIS — E119 Type 2 diabetes mellitus without complications: Secondary | ICD-10-CM | POA: Diagnosis not present

## 2022-02-01 HISTORY — PX: CYSTOSCOPY W/ RETROGRADES: SHX1426

## 2022-02-01 HISTORY — PX: CYSTOSCOPY/URETEROSCOPY/HOLMIUM LASER/STENT PLACEMENT: SHX6546

## 2022-02-01 LAB — GLUCOSE, CAPILLARY: Glucose-Capillary: 101 mg/dL — ABNORMAL HIGH (ref 70–99)

## 2022-02-01 LAB — POCT PREGNANCY, URINE: Preg Test, Ur: NEGATIVE

## 2022-02-01 SURGERY — CYSTOSCOPY/URETEROSCOPY/HOLMIUM LASER/STENT PLACEMENT
Anesthesia: General | Laterality: Right

## 2022-02-01 MED ORDER — DEXAMETHASONE SODIUM PHOSPHATE 10 MG/ML IJ SOLN
INTRAMUSCULAR | Status: AC
  Filled 2022-02-01: qty 1

## 2022-02-01 MED ORDER — DEXMEDETOMIDINE (PRECEDEX) IN NS 20 MCG/5ML (4 MCG/ML) IV SYRINGE
PREFILLED_SYRINGE | INTRAVENOUS | Status: DC | PRN
  Administered 2022-02-01: 8 ug via INTRAVENOUS

## 2022-02-01 MED ORDER — FENTANYL CITRATE (PF) 100 MCG/2ML IJ SOLN
INTRAMUSCULAR | Status: DC | PRN
  Administered 2022-02-01 (×2): 50 ug via INTRAVENOUS

## 2022-02-01 MED ORDER — ONDANSETRON HCL 4 MG/2ML IJ SOLN
INTRAMUSCULAR | Status: DC | PRN
  Administered 2022-02-01: 4 mg via INTRAVENOUS

## 2022-02-01 MED ORDER — CHLORHEXIDINE GLUCONATE 0.12 % MT SOLN
OROMUCOSAL | Status: AC
  Administered 2022-02-01: 15 mL via OROMUCOSAL
  Filled 2022-02-01: qty 15

## 2022-02-01 MED ORDER — KETOROLAC TROMETHAMINE 30 MG/ML IJ SOLN
INTRAMUSCULAR | Status: AC
  Filled 2022-02-01: qty 1

## 2022-02-01 MED ORDER — PROPOFOL 10 MG/ML IV BOLUS
INTRAVENOUS | Status: AC
  Filled 2022-02-01: qty 20

## 2022-02-01 MED ORDER — KETOROLAC TROMETHAMINE 30 MG/ML IJ SOLN
INTRAMUSCULAR | Status: DC | PRN
  Administered 2022-02-01: 30 mg via INTRAVENOUS

## 2022-02-01 MED ORDER — FENTANYL CITRATE (PF) 100 MCG/2ML IJ SOLN
INTRAMUSCULAR | Status: AC
Start: 2022-02-01 — End: ?
  Filled 2022-02-01: qty 2

## 2022-02-01 MED ORDER — ACETAMINOPHEN 10 MG/ML IV SOLN
INTRAVENOUS | Status: DC | PRN
  Administered 2022-02-01: 1000 mg via INTRAVENOUS

## 2022-02-01 MED ORDER — ROCURONIUM BROMIDE 10 MG/ML (PF) SYRINGE
PREFILLED_SYRINGE | INTRAVENOUS | Status: AC
  Filled 2022-02-01: qty 10

## 2022-02-01 MED ORDER — GENTAMICIN SULFATE 40 MG/ML IJ SOLN
INTRAVENOUS | Status: DC | PRN
  Administered 2022-02-01: 450 mg via INTRAVENOUS

## 2022-02-01 MED ORDER — LIDOCAINE HCL (PF) 2 % IJ SOLN
INTRAMUSCULAR | Status: AC
  Filled 2022-02-01: qty 5

## 2022-02-01 MED ORDER — SULFAMETHOXAZOLE-TRIMETHOPRIM 800-160 MG PO TABS: 1.0000 | ORAL_TABLET | Freq: Every day | ORAL | 0 refills | Status: DC

## 2022-02-01 MED ORDER — OXYCODONE HCL 5 MG/5ML PO SOLN: 5.0000 mg | Freq: Once | ORAL | Status: DC | PRN

## 2022-02-01 MED ORDER — ONDANSETRON HCL 4 MG/2ML IJ SOLN
INTRAMUSCULAR | Status: AC
  Filled 2022-02-01: qty 2

## 2022-02-01 MED ORDER — MIDAZOLAM HCL 2 MG/2ML IJ SOLN
INTRAMUSCULAR | Status: AC
Start: 2022-02-01 — End: ?
  Filled 2022-02-01: qty 2

## 2022-02-01 MED ORDER — DEXAMETHASONE SODIUM PHOSPHATE 10 MG/ML IJ SOLN
INTRAMUSCULAR | Status: DC | PRN
  Administered 2022-02-01: 5 mg via INTRAVENOUS

## 2022-02-01 MED ORDER — ROCURONIUM BROMIDE 100 MG/10ML IV SOLN
INTRAVENOUS | Status: DC | PRN
  Administered 2022-02-01: 40 mg via INTRAVENOUS
  Administered 2022-02-01: 10 mg via INTRAVENOUS

## 2022-02-01 MED ORDER — OXYBUTYNIN CHLORIDE ER 10 MG PO TB24: 10.0000 mg | ORAL_TABLET | Freq: Every day | ORAL | 1 refills | Status: DC

## 2022-02-01 MED ORDER — OXYCODONE HCL 5 MG PO TABS: 5.0000 mg | ORAL_TABLET | Freq: Once | ORAL | Status: DC | PRN

## 2022-02-01 MED ORDER — DROPERIDOL 2.5 MG/ML IJ SOLN
0.6250 mg | Freq: Once | INTRAMUSCULAR | Status: DC | PRN
  Filled 2022-02-01: qty 2

## 2022-02-01 MED ORDER — ACETAMINOPHEN 10 MG/ML IV SOLN: 1000.0000 mg | Freq: Once | INTRAVENOUS | Status: DC | PRN

## 2022-02-01 MED ORDER — MIDAZOLAM HCL 2 MG/2ML IJ SOLN
INTRAMUSCULAR | Status: DC | PRN
  Administered 2022-02-01 (×2): 1 mg via INTRAVENOUS

## 2022-02-01 MED ORDER — LIDOCAINE HCL (CARDIAC) PF 100 MG/5ML IV SOSY
PREFILLED_SYRINGE | INTRAVENOUS | Status: DC | PRN
  Administered 2022-02-01: 100 mg via INTRAVENOUS

## 2022-02-01 MED ORDER — SODIUM CHLORIDE 0.9 % IR SOLN
Status: DC | PRN
  Administered 2022-02-01: 3000 mL

## 2022-02-01 MED ORDER — IOHEXOL 180 MG/ML  SOLN
INTRAMUSCULAR | Status: DC | PRN
  Administered 2022-02-01: 20 mL

## 2022-02-01 MED ORDER — CEFAZOLIN SODIUM-DEXTROSE 2-4 GM/100ML-% IV SOLN
INTRAVENOUS | Status: AC
  Filled 2022-02-01: qty 100

## 2022-02-01 MED ORDER — FENTANYL CITRATE (PF) 100 MCG/2ML IJ SOLN: 25.0000 ug | INTRAMUSCULAR | Status: DC | PRN

## 2022-02-01 MED ORDER — GENTAMICIN SULFATE 40 MG/ML IJ SOLN
5.0000 mg/kg | Freq: Once | INTRAVENOUS | Status: DC
  Filled 2022-02-01: qty 11.25

## 2022-02-01 MED ORDER — ACETAMINOPHEN 10 MG/ML IV SOLN
INTRAVENOUS | Status: AC
  Filled 2022-02-01: qty 100

## 2022-02-01 MED ORDER — SUGAMMADEX SODIUM 200 MG/2ML IV SOLN
INTRAVENOUS | Status: DC | PRN
  Administered 2022-02-01: 200 mg via INTRAVENOUS

## 2022-02-01 MED ORDER — PROPOFOL 10 MG/ML IV BOLUS
INTRAVENOUS | Status: DC | PRN
  Administered 2022-02-01: 200 mg via INTRAVENOUS

## 2022-02-01 SURGICAL SUPPLY — 34 items
ADHESIVE MASTISOL STRL (MISCELLANEOUS) IMPLANT
BAG DRAIN CYSTO-URO LG1000N (MISCELLANEOUS) ×2 IMPLANT
BRUSH SCRUB EZ 1% IODOPHOR (MISCELLANEOUS) ×1 IMPLANT
CATH URET FLEX-TIP 2 LUMEN 10F (CATHETERS) IMPLANT
CATH URETL OPEN 5X70 (CATHETERS) IMPLANT
CNTNR SPEC 2.5X3XGRAD LEK (MISCELLANEOUS)
CONT SPEC 4OZ STER OR WHT (MISCELLANEOUS)
CONT SPEC 4OZ STRL OR WHT (MISCELLANEOUS)
CONTAINER SPEC 2.5X3XGRAD LEK (MISCELLANEOUS) IMPLANT
DRAPE UTILITY 15X26 TOWEL STRL (DRAPES) ×2 IMPLANT
DRSG TEGADERM 2-3/8X2-3/4 SM (GAUZE/BANDAGES/DRESSINGS) IMPLANT
FIBER LASER MOSES 200 DFL (Laser) ×1 IMPLANT
GAUZE 4X4 16PLY ~~LOC~~+RFID DBL (SPONGE) ×4 IMPLANT
GLOVE SURG UNDER POLY LF SZ7.5 (GLOVE) ×2 IMPLANT
GOWN STRL REUS W/ TWL LRG LVL3 (GOWN DISPOSABLE) ×1 IMPLANT
GOWN STRL REUS W/ TWL XL LVL3 (GOWN DISPOSABLE) ×1 IMPLANT
GOWN STRL REUS W/TWL LRG LVL3 (GOWN DISPOSABLE) ×2
GOWN STRL REUS W/TWL XL LVL3 (GOWN DISPOSABLE) ×2
GUIDEWIRE STR DUAL SENSOR (WIRE) ×3 IMPLANT
INFUSOR MANOMETER BAG 3000ML (MISCELLANEOUS) ×1 IMPLANT
IV NS IRRIG 3000ML ARTHROMATIC (IV SOLUTION) ×2 IMPLANT
KIT TURNOVER CYSTO (KITS) ×2 IMPLANT
PACK CYSTO AR (MISCELLANEOUS) ×2 IMPLANT
SET CYSTO W/LG BORE CLAMP LF (SET/KITS/TRAYS/PACK) ×2 IMPLANT
SHEATH URETERAL 12FRX35CM (MISCELLANEOUS) ×1 IMPLANT
STENT URET 6FRX24 CONTOUR (STENTS) IMPLANT
STENT URET 6FRX26 CONTOUR (STENTS) IMPLANT
STENT URET 6FRX28 CONTOUR (STENTS) ×1 IMPLANT
SURGILUBE 2OZ TUBE FLIPTOP (MISCELLANEOUS) ×2 IMPLANT
SYR 10ML LL (SYRINGE) ×2 IMPLANT
TRACTIP FLEXIVA PULSE ID 200 (Laser) IMPLANT
VALVE UROSEAL ADJ ENDO (VALVE) IMPLANT
WATER STERILE IRR 1000ML POUR (IV SOLUTION) ×1 IMPLANT
WATER STERILE IRR 500ML POUR (IV SOLUTION) ×2 IMPLANT

## 2022-02-01 NOTE — Op Note (Signed)
Date of procedure: 02/01/22 ? ?Preoperative diagnosis:  ?Right proximal ureteral stone and right renal stone ? ?Postoperative diagnosis:  ?Same ? ?Procedure: ?Right ureteroscopy, laser lithotripsy, right retrograde pyelogram with intraoperative interpretation, right ureteral stent placement ? ?Surgeon: Legrand Rams, MD ? ?Anesthesia: General ? ?Complications: None ? ?Intraoperative findings:  ?Uncomplicated dusting of all right-sided stones, largest 1.5 cm in upper pole ?Uncomplicated stent placement ? ?EBL: Minimal ? ?Specimens: None ? ?Drains: Right 6 French by 28 cm ureteral stent ? ?Indication: Molly Miller is a 41 y.o. patient with 5 mm right proximal ureteral stone and larger upper pole stone who previously presented with infection and underwent stent placement, she presents today for definitive management with ureteroscopy.  After reviewing the management options for treatment, they elected to proceed with the above surgical procedure(s). We have discussed the potential benefits and risks of the procedure, side effects of the proposed treatment, the likelihood of the patient achieving the goals of the procedure, and any potential problems that might occur during the procedure or recuperation. Informed consent has been obtained. ? ?Description of procedure: ? ?The patient was taken to the operating room and general anesthesia was induced. SCDs were placed for DVT prophylaxis. The patient was placed in the dorsal lithotomy position, prepped and draped in the usual sterile fashion, and preoperative antibiotics(gentamicin) were administered. A preoperative time-out was performed.  ? ?A 21 French rigid cystoscope was used to intubate the urethra and thorough cystoscopy was performed.  The bladder was grossly normal.  A sensor wire was advanced alongside the right ureteral stent up to the kidney under fluoroscopic vision.  The stent was then grasped and pulled to the meatus, and a second safety wire passed  through the stent up to the kidney and the old stent removed. ? ?A 12/38F by 35 cm ureteral access sheath was advanced gently over the wire up to the kidney under fluoroscopic vision.  Thorough pyeloscopy revealed a 5 mm stone in the renal pelvis, as well as a larger 1.5 cm stone in the upper pole.  A 200 ?m laser Moses fiber on settings of 0.4 J and 80 Hz was used to methodically dust the stones to <22mm fragments.  Thorough pyeloscopy revealed no other significant residual stone fragments.  This sheath was removed, and careful pullback ureteroscopy showed no additional stones.  The scope was then readvanced to the proximal ureter and a retrograde pyelogram was performed which showed no extravasation or filling defects.  Pullback ureteroscopy again showed no abnormalities or residual stones. ? ?A 6 [place] by 28 cm ureteral stent was uneventfully advanced over the wire fluoroscopically with an excellent curl in the renal pelvis, as well as under direct vision the bladder.  The bladder was drained, and this concluded our procedure. ? ?Disposition: Stable to PACU ? ?Plan: ?Follow-up in clinic in 1 week for stent removal ?Bactrim prophylaxis with stent in ? ?Legrand Rams, MD ? ?

## 2022-02-01 NOTE — H&P (Signed)
? ?  02/01/22 ?9:42 AM  ? ?Molly Miller ?21-May-1981 ?[id number] ? ?CC: Right ureteral and renal stones ? ?HPI: ?41 year old female who previously presented with a 5 mm right proximal ureteral stone and larger 1 cm renal stone.  She was scheduled for ureteroscopy, but had purulent urine intraoperatively, and fever to 38 degrees, and a stent was placed and she was treated with 2 weeks of culture appropriate Bactrim.  She presents today for definitive management with ureteroscopy. ? ? ?PMH: ?Past Medical History:  ?Diagnosis Date  ? Acne   ? Anxiety   ? Depression   ? Kidney stone   ? Pre-diabetes   ? ? ?Surgical History: ?Past Surgical History:  ?Procedure Laterality Date  ? CYSTOSCOPY W/ URETERAL STENT PLACEMENT Right 02/14/2016  ? Procedure: CYSTOSCOPY WITH STENT EXCHANGE;  Surgeon: Hildred Laser, MD;  Location: [facility] OR[facility];  Service: Urology;  Laterality: Right;  ? CYSTOSCOPY WITH STENT PLACEMENT Right 01/14/2016  ? Procedure: RIGHT CYSTOSCOPY, RIGHT RETROGRADE, RIGHT URETERAL STENT PLACEMENT;  Surgeon: Crist Fat, MD;  Location: [facility];  Service: Urology;  Laterality: Right;  ? CYSTOSCOPY/URETEROSCOPY/HOLMIUM LASER/STENT PLACEMENT Right 01/16/2022  ? Procedure: CYSTOSCOPY/URETEROSCOPY/ RIGHT URETERAL STENT PLACEMENT;  Surgeon: Sondra Come, MD;  Location: [facility] ORS;  Service: Urology;  Laterality: Right;  ? HOLMIUM LASER APPLICATION Right 02/14/2016  ? Procedure: HOLMIUM LASER APPLICATION;  Surgeon: Hildred Laser, MD;  Location: [facility];  Service: Urology;  Laterality: Right;  ? URETEROSCOPY WITH HOLMIUM LASER LITHOTRIPSY Right 02/14/2016  ? Procedure: URETEROSCOPY WITH HOLMIUM LASER LITHOTRIPSY;  Surgeon: Hildred Laser, MD;  Location: [facility];  Service: Urology;  Laterality: Right;  ? ? ? ?Family History: ?Family History  ?Problem Relation Age of Onset  ? Stroke Mother   ? Seizures Mother   ? Dementia Mother   ? Cancer Mother   ?     Cervical?  ? Nephrolithiasis Mother   ? Hematuria Mother    ? Depression Mother   ? Hypertension Father   ? Arthritis Father   ? Heart disease Father   ? Depression Sister   ? ? ?Social History:  reports that she quit smoking about 21 months ago. Her smoking use included cigarettes. She has never used smokeless tobacco. She reports current alcohol use. She reports that she does not use drugs. ? ?Physical Exam: ?BP 136/83   Pulse 86   Temp (!) 97.4 ?F (36.3 ?C) (Temporal)   Resp 18   Ht 5\' 9"  (1.753 m)   Wt 123.8 kg   LMP 01/04/2022 (Within Days)   SpO2 97%   BMI 40.32 kg/m?   ? ?Constitutional:  Alert and oriented, No acute distress. ?Cardiovascular: Regular rate and rhythm ?Respiratory: Clear to auscultation bilaterally ?GI: Abdomen is soft, nontender, nondistended, no abdominal masses ? ?Assessment & Plan:   ?41 year old female with 5 mm right proximal ureteral stone and larger 1 cm renal stone, previously stented for infection and treated with culture appropriate Bactrim. ? ?We specifically discussed the risks ureteroscopy including bleeding, infection/sepsis, stent related symptoms including flank pain/urgency/frequency/incontinence/dysuria, ureteral injury, inability to access stone, or need for staged or additional procedures. ? ?Right ureteroscopy, laser lithotripsy, stent placement today ? ?41, MD ?02/01/2022 ? ?[facility] ?[address], [address] ?[place], [place] ?[phone] ? ? ?

## 2022-02-01 NOTE — Anesthesia Postprocedure Evaluation (Signed)
Anesthesia Post Note ? ?Patient: Molly Miller ? ?Procedure(s) Performed: CYSTOSCOPY/URETEROSCOPY/HOLMIUM LASER/STENT PLACEMENT (Right) ?CYSTOSCOPY WITH RETROGRADE PYELOGRAM (Right) ? ?Patient location during evaluation: PACU ?Anesthesia Type: General ?Level of consciousness: awake and alert ?Pain management: pain level controlled ?Vital Signs Assessment: post-procedure vital signs reviewed and stable ?Respiratory status: spontaneous breathing, nonlabored ventilation and respiratory function stable ?Cardiovascular status: blood pressure returned to baseline and stable ?Postop Assessment: no apparent nausea or vomiting ?Anesthetic complications: no ? ? ?No notable events documented. ? ? ?Last Vitals:  ?Vitals:  ? 02/01/22 1155 02/01/22 1225  ?BP: (!) 149/94 137/87  ?Pulse: 81 86  ?Resp: 16 16  ?Temp: (!) 36.3 ?C   ?SpO2: 100% 98%  ?  ?Last Pain:  ?Vitals:  ? 02/01/22 1225  ?TempSrc:   ?PainSc: 0-No pain  ? ? ?  ?  ?  ?  ?  ?  ? ?Foye Deer ? ? ? ? ?

## 2022-02-01 NOTE — Anesthesia Preprocedure Evaluation (Addendum)
Anesthesia Evaluation  ?Patient identified by MRN, date of birth, ID band ?Patient awake ? ? ? ?Reviewed: ?Allergy & Precautions, H&P , NPO status , Patient's Chart, lab work & pertinent test results, reviewed documented beta blocker date and time  ? ?Airway ?Mallampati: I ? ?TM Distance: >3 FB ?Neck ROM: full ? ? ? Dental ? ?(+) Teeth Intact ?  ?Pulmonary ?neg pulmonary ROS, Patient abstained from smoking., former smoker,  ?  ?Pulmonary exam normal ? ? ? ? ? ? ? Cardiovascular ?Exercise Tolerance: Good ?negative cardio ROS ?Normal cardiovascular exam ?Rate:Normal ? ? ?  ?Neuro/Psych ?PSYCHIATRIC DISORDERS Anxiety Depression Bipolar Disorder negative neurological ROS ?   ? GI/Hepatic ?negative GI ROS, Neg liver ROS,   ?Endo/Other  ?diabetes (pre-diabetes), Well Controlled, Oral Hypoglycemic AgentsMorbid obesity ? Renal/GU ?Renal diseaseRight Ureteral Stone  ?negative genitourinary ?  ?Musculoskeletal ? ? Abdominal ?(+) + obese,   ?Peds ? Hematology ?negative hematology ROS ?(+)   ?Anesthesia Other Findings ? ? Reproductive/Obstetrics ?negative OB ROS ? ?  ? ? ? ? ? ? ? ? ? ? ? ? ? ?  ?  ? ? ? ? ? ? ? ?Anesthesia Physical ? ?Anesthesia Plan ? ?ASA: 3 ? ?Anesthesia Plan: General  ? ?Post-op Pain Management:   ? ?Induction: Intravenous ? ?PONV Risk Score and Plan: 3 and Ondansetron, Dexamethasone and Midazolam ? ?Airway Management Planned: Oral ETT ? ?Additional Equipment:  ? ?Intra-op Plan:  ? ?Post-operative Plan: Extubation in OR ? ?Informed Consent: I have reviewed the patients History and Physical, chart, labs and discussed the procedure including the risks, benefits and alternatives for the proposed anesthesia with the patient or authorized representative who has indicated his/her understanding and acceptance.  ? ? ? ?Dental advisory given ? ?Plan Discussed with: CRNA ? ?Anesthesia Plan Comments:   ? ? ? ? ?Anesthesia Quick Evaluation ? ?

## 2022-02-01 NOTE — Anesthesia Procedure Notes (Signed)
Procedure Name: Intubation ?Date/Time: 02/01/2022 10:13 AM ?Performed by: Loletha Grayer, CRNA ?Pre-anesthesia Checklist: Patient identified, Patient being monitored, Timeout performed, Emergency Drugs available and Suction available ?Patient Re-evaluated:Patient Re-evaluated prior to induction ?Oxygen Delivery Method: Circle system utilized ?Preoxygenation: Pre-oxygenation with 100% oxygen ?Induction Type: IV induction ?Ventilation: Mask ventilation without difficulty ?Laryngoscope Size: Mac and 3 ?Grade View: Grade I ?Tube type: Oral ?Tube size: 7.0 mm ?Number of attempts: 1 ?Airway Equipment and Method: Stylet ?Placement Confirmation: ETT inserted through vocal cords under direct vision, positive ETCO2 and breath sounds checked- equal and bilateral ?Secured at: 21 cm ?Tube secured with: Tape ?Dental Injury: Teeth and Oropharynx as per pre-operative assessment  ? ? ? ? ?

## 2022-02-01 NOTE — Discharge Instructions (Signed)

## 2022-02-01 NOTE — Transfer of Care (Signed)
Immediate Anesthesia Transfer of Care Note ? ?Patient: Molly Miller ? ?Procedure(s) Performed: CYSTOSCOPY/URETEROSCOPY/HOLMIUM LASER/STENT PLACEMENT (Right) ?CYSTOSCOPY WITH RETROGRADE PYELOGRAM (Right) ? ?Patient Location: PACU ? ?Anesthesia Type:General ? ?Level of Consciousness: awake ? ?Airway & Oxygen Therapy: Patient Spontanous Breathing and Patient connected to face mask ? ?Post-op Assessment: Report given to RN and Post -op Vital signs reviewed and stable ? ?Post vital signs: Reviewed and stable ? ?Last Vitals:  ?Vitals Value Taken Time  ?BP 139/77 02/01/22 1103  ?Temp 36.5 ?C 02/01/22 1103  ?Pulse 97 02/01/22 1106  ?Resp 14 02/01/22 1106  ?SpO2 100 % 02/01/22 1106  ?Vitals shown include unvalidated device data. ? ?Last Pain:  ?Vitals:  ? 02/01/22 0807  ?TempSrc: Temporal  ?PainSc: 4   ?   ? ?  ? ?Complications: No notable events documented. ?

## 2022-02-07 ENCOUNTER — Encounter: Payer: Self-pay | Admitting: Urology

## 2022-02-07 ENCOUNTER — Ambulatory Visit (INDEPENDENT_AMBULATORY_CARE_PROVIDER_SITE_OTHER): Payer: 59 | Admitting: Urology

## 2022-02-07 VITALS — BP 134/84 | HR 94 | Ht 69.0 in | Wt 273.0 lb

## 2022-02-07 DIAGNOSIS — N201 Calculus of ureter: Secondary | ICD-10-CM

## 2022-02-07 LAB — URINALYSIS, COMPLETE
Bilirubin, UA: NEGATIVE
Glucose, UA: NEGATIVE
Ketones, UA: NEGATIVE
Nitrite, UA: NEGATIVE
Specific Gravity, UA: 1.025 (ref 1.005–1.030)
Urobilinogen, Ur: 0.2 mg/dL (ref 0.2–1.0)
pH, UA: 6 (ref 5.0–7.5)

## 2022-02-07 LAB — MICROSCOPIC EXAMINATION
RBC, Urine: >30 /hpf — ABNORMAL HIGH (ref 0–2)
WBC, UA: >30 /hpf — ABNORMAL HIGH (ref 0–5)

## 2022-02-07 NOTE — Patient Instructions (Signed)
Dietary Guidelines to Help Prevent Kidney Stones Kidney stones are deposits of minerals and salts that form inside your kidneys. Your risk of developing kidney stones may be greater depending on your diet, your lifestyle, the medicines you take, and whether you have certain medical conditions. Most people can lower their chances of developing kidney stones by following the instructions below. Your dietitian may give you more specific instructions depending on your overall health and the type of kidney stones you tend to develop. What are tips for following this plan? Reading food labels  Choose foods with "no salt added" or "low-salt" labels. Limit your salt (sodium) intake to less than 1,500 mg a day. Choose foods with calcium for each meal and snack. Try to eat about 300 mg of calcium at each meal. Foods that contain 200-500 mg of calcium a serving include: 8 oz (237 mL) of milk, calcium-fortifiednon-dairy milk, and calcium-fortifiedfruit juice. Calcium-fortified means that calcium has been added to these drinks. 8 oz (237 mL) of kefir, yogurt, and soy yogurt. 4 oz (114 g) of tofu. 1 oz (28 g) of cheese. 1 cup (150 g) of dried figs. 1 cup (91 g) of cooked broccoli. One 3 oz (85 g) can of sardines or mackerel. Most people need 1,000-1,500 mg of calcium a day. Talk to your dietitian about how much calcium is recommended for you. Shopping Buy plenty of fresh fruits and vegetables. Most people do not need to avoid fruits and vegetables, even if these foods contain nutrients that may contribute to kidney stones. When shopping for convenience foods, choose: Whole pieces of fruit. Pre-made salads with dressing on the side. Low-fat fruit and yogurt smoothies. Avoid buying frozen meals or prepared deli foods. These can be high in sodium. Look for foods with live cultures, such as yogurt and kefir. Choose high-fiber grains, such as whole-wheat breads, oat bran, and wheat cereals. Cooking Do not add  salt to food when cooking. Place a salt shaker on the table and allow each person to add his or her own salt to taste. Use vegetable protein, such as beans, textured vegetable protein (TVP), or tofu, instead of meat in pasta, casseroles, and soups. Meal planning Eat less salt, if told by your dietitian. To do this: Avoid eating processed or pre-made food. Avoid eating fast food. Eat less animal protein, including cheese, meat, poultry, or fish, if told by your dietitian. To do this: Limit the number of times you have meat, poultry, fish, or cheese each week. Eat a diet free of meat at least 2 days a week. Eat only one serving each day of meat, poultry, fish, or seafood. When you prepare animal protein, cut pieces into small portion sizes. For most meat and fish, one serving is about the size of the palm of your hand. Eat at least five servings of fresh fruits and vegetables each day. To do this: Keep fruits and vegetables on hand for snacks. Eat one piece of fruit or a handful of berries with breakfast. Have a salad and fruit at lunch. Have two kinds of vegetables at dinner. Limit foods that are high in a substance called oxalate. These include: Spinach (cooked), rhubarb, beets, sweet potatoes, and Swiss chard. Peanuts. Potato chips, french fries, and baked potatoes with skin on. Nuts and nut products. Chocolate. If you regularly take a diuretic medicine, make sure to eat at least 1 or 2 servings of fruits or vegetables that are high in potassium each day. These include: Avocado. Banana. Orange, prune,   carrot, or tomato juice. Baked potato. Cabbage. Beans and split peas. Lifestyle  Drink enough fluid to keep your urine pale yellow. This is the most important thing you can do. Spread your fluid intake throughout the day. If you drink alcohol: Limit how much you use to: 0-1 drink a day for women who are not pregnant. 0-2 drinks a day for men. Be aware of how much alcohol is in your  drink. In the [place] one drink equals one 12 oz bottle of beer (355 mL), one 5 oz glass of wine (148 mL), or one 1 oz glass of hard liquor (44 mL). Lose weight if told by your health care provider. Work with your dietitian to find an eating plan and weight loss strategies that work best for you. General information Talk to your health care provider and dietitian about taking daily supplements. You may be told the following depending on your health and the cause of your kidney stones: Not to take supplements with vitamin C. To take a calcium supplement. To take a daily probiotic supplement. To take other supplements such as magnesium, fish oil, or vitamin B6. Take over-the-counter and prescription medicines only as told by your health care provider. These include supplements. What foods should I limit? Limit your intake of the following foods, or eat them as told by your dietitian. Vegetables Spinach. Rhubarb. Beets. Canned vegetables. Pickles. Olives. Baked potatoes with skin. Grains Wheat bran. Baked goods. Salted crackers. Cereals high in sugar. Meats and other proteins Nuts. Nut butters. Large portions of meat, poultry, or fish. Salted, precooked, or cured meats, such as sausages, meat loaves, and hot dogs. Dairy Cheese. Beverages Regular soft drinks. Regular vegetable juice. Seasonings and condiments Seasoning blends with salt. Salad dressings. Soy sauce. Ketchup. Barbecue sauce. Other foods Canned soups. Canned pasta sauce. Casseroles. Pizza. Lasagna. Frozen meals. Potato chips. French fries. The items listed above may not be a complete list of foods and beverages you should limit. Contact a dietitian for more information. What foods should I avoid? Talk to your dietitian about specific foods you should avoid based on the type of kidney stones you have and your overall health. Fruits Grapefruit. The item listed above may not be a complete list of foods and beverages you should  avoid. Contact a dietitian for more information. Summary Kidney stones are deposits of minerals and salts that form inside your kidneys. You can lower your risk of kidney stones by making changes to your diet. The most important thing you can do is drink enough fluid. Drink enough fluid to keep your urine pale yellow. Talk to your dietitian about how much calcium you should have each day, and eat less salt and animal protein as told by your dietitian. This information is not intended to replace advice given to you by your health care provider. Make sure you discuss any questions you have with your health care provider. Document Revised: 10/14/2019 Document Reviewed: 10/14/2019 Elsevier Patient Education  2022 [employer] Inc.  

## 2022-02-07 NOTE — Progress Notes (Signed)
Cystoscopy Procedure Note: ? ?Indication: Stent removal s/p 02/01/22 right URS/LL/stent  ? ?Bactrim given for prophylaxis ? ?After informed consent and discussion of the procedure and its risks, Molly Miller was positioned and prepped in the standard fashion. Cystoscopy was performed with a flexible cystoscope. The stent was grasped with flexible graspers and removed in its entirety. The patient tolerated the procedure well. ? ?Findings: ?Uncomplicated stent removal ? ?Assessment and Plan: ?We discussed general stone prevention strategies including adequate hydration with goal of producing 2.5 L of urine daily, increasing citric acid intake, increasing calcium intake during high oxalate meals, minimizing animal protein, and decreasing salt intake. Information about dietary recommendations given today.  ? ?RTC 1 year KUB ? ?Sondra Come, MD ?02/07/2022 ? ?

## 2022-02-13 ENCOUNTER — Telehealth: Payer: Self-pay | Admitting: Primary Care

## 2022-02-13 ENCOUNTER — Telehealth: Payer: Self-pay | Admitting: Pharmacist

## 2022-02-13 NOTE — Telephone Encounter (Signed)
Submitted PA for Ozempic now that patient has failed metformin for weight loss. According to previous documentation Ozempic should be covered after trying/failing metformin. ? ?Key: [id number] ?

## 2022-02-13 NOTE — Telephone Encounter (Signed)
Noted, appreciate the assistance! ? ?Please let me know the result of the PA for her Ozempic. ?[name] ?

## 2022-02-13 NOTE — Telephone Encounter (Signed)
-----   Message from Charlton Haws, [facility] sent at 02/13/2022  8:22 AM EDT ----- ?Regarding: RE: Question! ?I was looking through her chart, and I think now that she has failed metformin her plan will approve Ozempic. I submitted a PA with that new info, awaiting determination. ? ?Key: [id number]  ? ?Unfortunately the copay card requires the product is covered by insurance first, so it wouldn't work until she is approved. ? ?Molly Miller ?----- Message ----- ?From: Pleas Koch, NP ?Sent: 02/12/2022   5:13 PM EDT ?To: Charlton Haws, RPH ?Subject: Question!                                     ? ?Hi Molly Miller! ? ?This is one of the very few patients for which I have agreed to try weight loss medication. She has several co-morbidities and has been largely unsuccessful with weight loss. ? ?History of prediabetes, not diabetes. ? ?Her insurance would not cover Ozempic or Saxenda. As you are aware, the other GLP 1 antagonists are not approved for weight loss treatment. Do you have any coupon cards that work? Any suggestions? ? ?I do not want to go the phentermine route.  ? ?Thanks! ?Molly Miller ? ? ?

## 2022-02-18 NOTE — Telephone Encounter (Signed)
Thanks, Mardella Layman. ?I appreciate your help! ? ?She does have prediabetes, I guess that's not good enough? ?

## 2022-02-18 NOTE — Telephone Encounter (Signed)
Received denial of Ozempic appeal - per letter, insurance plan will not cover "any product dispensed for the purpose of appetite suppression or weight loss" - this would include any GLP-1 agonist that is not being used for diabetes. ? ?I did look into coupons for cash-paying patients, Reginal Lutes offers a $500 off coupon, and Saxenda offers $200 off. Unfortunately both of these products are round $1400 for a month supply so I don't think those would be very helpful. Ozempic/Victoza do not have coupons for patients without insurance coverage. Patient assistance is only for medicare or uninsured patients, so she is not eligible for that either. ? ?

## 2022-02-19 NOTE — Telephone Encounter (Signed)
Unfortunately no, they specified A1c needs to be > 6.5% ?

## 2022-02-19 NOTE — Telephone Encounter (Signed)
Thank you for trying.

## 2022-03-07 ENCOUNTER — Encounter: Payer: Self-pay | Admitting: Psychiatry

## 2022-03-07 ENCOUNTER — Telehealth (INDEPENDENT_AMBULATORY_CARE_PROVIDER_SITE_OTHER): Payer: 59 | Admitting: Psychiatry

## 2022-03-07 DIAGNOSIS — F3176 Bipolar disorder, in full remission, most recent episode depressed: Secondary | ICD-10-CM | POA: Diagnosis not present

## 2022-03-07 DIAGNOSIS — F411 Generalized anxiety disorder: Secondary | ICD-10-CM | POA: Diagnosis not present

## 2022-03-07 DIAGNOSIS — G4701 Insomnia due to medical condition: Secondary | ICD-10-CM | POA: Diagnosis not present

## 2022-03-07 MED ORDER — L-METHYLFOLATE 7.5 MG PO TABS: 7.5000 mg | ORAL_TABLET | Freq: Every morning | ORAL | 3 refills | Status: DC

## 2022-03-07 MED ORDER — DULOXETINE HCL 20 MG PO CPEP: 20.0000 mg | ORAL_CAPSULE | Freq: Two times a day (BID) | ORAL | 0 refills | Status: DC

## 2022-03-07 NOTE — Progress Notes (Signed)
Virtual Visit via Video Note ? ?I connected with Molly Miller on 03/07/22 at 10:00 AM EDT by a video enabled telemedicine application and verified that I am speaking with the correct person using two identifiers. ? ?Location ?Provider Location : [facility] ?Patient Location : Work ? ?Participants: Patient , Provider ? ?  ?I discussed the limitations of evaluation and management by telemedicine and the availability of in person appointments. The patient expressed understanding and agreed to proceed. ? ?  ?I discussed the assessment and treatment plan with the patient. The patient was provided an opportunity to ask questions and all were answered. The patient agreed with the plan and demonstrated an understanding of the instructions. ?  ?The patient was advised to call back or seek an in-person evaluation if the symptoms worsen or if the condition fails to improve as anticipated. ? ? ?BH MD OP Progress Note ? ?03/07/2022 10:38 AM ?Molly Miller  ?MRN:  [id number] ? ?Chief Complaint:  ?Chief Complaint  ?Patient presents with  ? Follow-up: 41 year old Caucasian female with history of bipolar disorder, currently with sciatica presents for medication management.  ? ?HPI: Molly Miller is a 41 year old Caucasian female, divorced, currently lives with her boyfriend in [place], has a history of bipolar disorder type II, GAD was evaluated by telemedicine today. ? ?Patient today reports she is currently struggling with sciatica, currently under the care of a chiropractor.  The pain does have an impact on her ability to function, concentrate as well as sleep. Currently not taking any pain medications. ? ?Patient today does report anxiety mostly because of her pain.  Ongoing since the past 2 to 3 weeks. ? ?Patient reports appetite as fair. ? ?Reports sleep as good. ? ?Denies any suicidality, homicidality or perceptual disturbances. ? ?Denies any other concerns today. ? ? ? ?Visit Diagnosis:  ?  ICD-10-CM   ?1. Bipolar  disorder, in full remission, most recent episode depressed (HCC)  F31.76   ? type 2 depressed with mixed features  ?  ?2. GAD (generalized anxiety disorder)  F41.1 DULoxetine (CYMBALTA) 20 MG capsule  ?  L-Methylfolate 7.5 MG TABS  ?  ?3. Insomnia due to medical condition  G47.01   ? mood  ?  ? ? ?Past Psychiatric History: Reviewed past psychiatric history from progress note on 08/09/2020.  Past trials of venlafaxine ? ?Past Medical History:  ?Past Medical History:  ?Diagnosis Date  ? Acne   ? Anxiety   ? Depression   ? Kidney stone   ? Pre-diabetes   ?  ?Past Surgical History:  ?Procedure Laterality Date  ? CYSTOSCOPY W/ RETROGRADES Right 02/01/2022  ? Procedure: CYSTOSCOPY WITH RETROGRADE PYELOGRAM;  Surgeon: Sondra Come, MD;  Location: [facility];  Service: Urology;  Laterality: Right;  ? CYSTOSCOPY W/ URETERAL STENT PLACEMENT Right 02/14/2016  ? Procedure: CYSTOSCOPY WITH STENT EXCHANGE;  Surgeon: Hildred Laser, MD;  Location: [facility];  Service: Urology;  Laterality: Right;  ? CYSTOSCOPY WITH STENT PLACEMENT Right 01/14/2016  ? Procedure: RIGHT CYSTOSCOPY, RIGHT RETROGRADE, RIGHT URETERAL STENT PLACEMENT;  Surgeon: Crist Fat, MD;  Location: [facility];  Service: Urology;  Laterality: Right;  ? CYSTOSCOPY/URETEROSCOPY/HOLMIUM LASER/STENT PLACEMENT Right 01/16/2022  ? Procedure: CYSTOSCOPY/URETEROSCOPY/ RIGHT URETERAL STENT PLACEMENT;  Surgeon: Sondra Come, MD;  Location: [facility];  Service: Urology;  Laterality: Right;  ? CYSTOSCOPY/URETEROSCOPY/HOLMIUM LASER/STENT PLACEMENT Right 02/01/2022  ? Procedure: CYSTOSCOPY/URETEROSCOPY/HOLMIUM LASER/STENT PLACEMENT;  Surgeon: Sondra Come, MD;  Location: [facility];  Service: Urology;  Laterality: Right;  ? HOLMIUM LASER APPLICATION Right 02/14/2016  ? Procedure: HOLMIUM LASER APPLICATION;  Surgeon: Hildred Laser, MD;  Location: [facility];  Service: Urology;  Laterality: Right;  ? URETEROSCOPY WITH HOLMIUM LASER LITHOTRIPSY Right 02/14/2016  ?  Procedure: URETEROSCOPY WITH HOLMIUM LASER LITHOTRIPSY;  Surgeon: Hildred Laser, MD;  Location: [facility];  Service: Urology;  Laterality: Right;  ? ? ?Family Psychiatric History: Reviewed family psychiatric history from progress note on 08/09/2020. ? ?Family History:  ?Family History  ?Problem Relation Age of Onset  ? Stroke Mother   ? Seizures Mother   ? Dementia Mother   ? Cancer Mother   ?     Cervical?  ? Nephrolithiasis Mother   ? Hematuria Mother   ? Depression Mother   ? Hypertension Father   ? Arthritis Father   ? Heart disease Father   ? Depression Sister   ? ? ?Social History: Reviewed social history from progress note on 08/09/2020. ?Social History  ? ?Socioeconomic History  ? Marital status: Divorced  ?  Spouse name: Not on file  ? Number of children: Not on file  ? Years of education: Not on file  ? Highest education level: Not on file  ?Occupational History  ? Not on file  ?Tobacco Use  ? Smoking status: Former  ?  Types: Cigarettes  ?  Quit date: 04/05/2020  ?  Years since quitting: 1.9  ?  Passive exposure: Past  ? Smokeless tobacco: Never  ? Tobacco comments:  ?  june 2021  ?Vaping Use  ? Vaping Use: Every day  ? Substances: Nicotine  ? Devices: occa  ?Substance and Sexual Activity  ? Alcohol use: Yes  ?  Alcohol/week: 0.0 standard drinks  ?  Comment: social  ? Drug use: No  ? Sexual activity: Not on file  ?Other Topics Concern  ? Not on file  ?Social History Narrative  ? Single  ? Works at [employer]  ? Enjoys shopping and antique and [employer], gardening.  ?   ? ?Social Determinants of Health  ? ?Financial Resource Strain: Not on file  ?Food Insecurity: Not on file  ?Transportation Needs: Not on file  ?Physical Activity: Not on file  ?Stress: Not on file  ?Social Connections: Not on file  ? ? ?Allergies:  ?Allergies  ?Allergen Reactions  ? Tramadol Nausea Only  ? ? ?Metabolic Disorder Labs: ?Lab Results  ?Component Value Date  ? HGBA1C 5.9 10/19/2021  ? ?No results found for:  PROLACTIN ?Lab Results  ?Component Value Date  ? CHOL 183 10/19/2021  ? TRIG 256.0 (H) 10/19/2021  ? HDL 35.90 (L) 10/19/2021  ? CHOLHDL 5 10/19/2021  ? VLDL 51.2 (H) 10/19/2021  ? ?Lab Results  ?Component Value Date  ? TSH 1.70 08/21/2020  ? TSH 1.520 06/02/2019  ? ? ?Therapeutic Level Labs: ?No results found for: LITHIUM ?No results found for: VALPROATE ?No components found for:  CBMZ ? ?Current Medications: ?Current Outpatient Medications  ?Medication Sig Dispense Refill  ? acetaminophen (TYLENOL) 500 MG tablet Take 500 mg by mouth every 6 (six) hours as needed.    ? cetirizine (ZYRTEC) 10 MG tablet Take 10 mg by mouth daily.    ? DULoxetine (CYMBALTA) 20 MG capsule Take 1 capsule (20 mg total) by mouth 2 (two) times daily. 180 capsule 0  ? famotidine (PEPCID) 10 MG tablet Take 10 mg by mouth daily.    ? ibuprofen (ADVIL) 800 MG tablet Take 1 tablet (  800 mg total) by mouth every 8 (eight) hours as needed. 30 tablet 0  ? L-Methylfolate 7.5 MG TABS Take 1 tablet (7.5 mg total) by mouth every morning. 30 tablet 3  ? lamoTRIgine (LAMICTAL) 25 MG tablet Take 1 tablet (25 mg total) by mouth 2 (two) times daily. 180 tablet 0  ? magnesium 30 MG tablet Take 30 mg by mouth 2 (two) times daily.    ? traZODone (DESYREL) 50 MG tablet Take 0.5-1 tablets (25-50 mg total) by mouth at bedtime as needed for sleep. 90 tablet 0  ? oxyCODONE-acetaminophen (PERCOCET) 5-325 MG tablet Take 1 tablet by mouth every 4 (four) hours as needed. (Patient not taking: Reported on 03/07/2022) 12 tablet 0  ? ?No current facility-administered medications for this visit.  ? ? ? ?Musculoskeletal: ?Strength & Muscle Tone:  UTA ?Gait & Station:  Seated ?Patient leans: N/A ? ?Psychiatric Specialty Exam: ?Review of Systems  ?Musculoskeletal:  Positive for back pain.  ?     Sciatica-left   ?Psychiatric/Behavioral:  Positive for sleep disturbance. The patient is nervous/anxious.   ?All other systems reviewed and are negative.  ?There were no vitals taken for  this visit.There is no height or weight on file to calculate BMI.  ?General Appearance: Casual  ?Eye Contact:  Fair  ?Speech:  Clear and Coherent  ?Volume:  Normal  ?Mood:  Anxious  ?Affect:  Appropriate  ?Thought Process:

## 2022-03-24 ENCOUNTER — Other Ambulatory Visit: Payer: Self-pay | Admitting: Primary Care

## 2022-03-24 DIAGNOSIS — R7303 Prediabetes: Secondary | ICD-10-CM

## 2022-03-29 ENCOUNTER — Encounter: Payer: Self-pay | Admitting: Primary Care

## 2022-03-29 ENCOUNTER — Ambulatory Visit (INDEPENDENT_AMBULATORY_CARE_PROVIDER_SITE_OTHER): Payer: 59 | Admitting: Primary Care

## 2022-03-29 VITALS — BP 132/78 | HR 86 | Temp 98.6°F | Ht 69.0 in | Wt 276.0 lb

## 2022-03-29 DIAGNOSIS — Z6841 Body Mass Index (BMI) 40.0 and over, adult: Secondary | ICD-10-CM | POA: Diagnosis not present

## 2022-03-29 DIAGNOSIS — M5442 Lumbago with sciatica, left side: Secondary | ICD-10-CM

## 2022-03-29 DIAGNOSIS — M545 Low back pain, unspecified: Secondary | ICD-10-CM | POA: Insufficient documentation

## 2022-03-29 MED ORDER — CYCLOBENZAPRINE HCL 5 MG PO TABS: 5.0000 mg | ORAL_TABLET | Freq: Every evening | ORAL | 0 refills | Status: DC | PRN

## 2022-03-29 MED ORDER — PREDNISONE 20 MG PO TABS: ORAL_TABLET | ORAL | 0 refills | Status: DC

## 2022-03-29 NOTE — Assessment & Plan Note (Addendum)
Commended her on her efforts for weight loss. Continue to follow with [facility].   Insurance will not cover weight loss medications.   Encouraged regular exercise.

## 2022-03-29 NOTE — Assessment & Plan Note (Signed)
MSK related, no alarm signs on exam or HPI.   Start prednisone 40 mg daily x 4 days, then 20 mg daily x 4 days. Start cyclobenzaprine 5 mg HS PRN.  Discussed stretching, walking. Return precautions provided.

## 2022-03-29 NOTE — Progress Notes (Signed)
Subjective:    Patient ID: Molly Miller, female    DOB: January 10, 1981, 41 y.o.   MRN: [id number]  HPI  Molly Miller is a very pleasant 41 y.o. female with a history of prediabetes, morbid obesity, anxiety and depression, elevated blood pressure reading who presents today for follow-up of obesity and to discuss acute back pain.   She was last evaluated in our office on 10/19/2021.  During this visit she requested medication treatment to help with weight loss as she has failed several nonprescription methods for weight loss previously.  We prescribed Saxenda for which was not covered by her insurance company.  We then prescribed Ozempic for which was also not covered by her insurance company.  We then prescribed metformin given her history of prediabetes to see if this helps with some weight loss.  She notified us in April 2023 that she did not see an improvement in her weight.  We then tried to prescribe Ozempic again given that she had failed metformin, however her insurance company would not cover Ozempic.  Since her last visit she's had two lithotripsy procedures for renal stones so she's not been able to exercise. She's developed left sided back pain two days ago with radiation of pain down to to her lower extremity across her dorsal foot. She has an appointment with her chiropractor this afternoon. She denies trauma/injury, changes in bowel/bladder control, numbness.   She's hired a Buyer, retail one week ago to help with her diet. She has learned a lot and is working to change her diet. She did start walking on the treadmill at work during her lunch break until recently when her back pain began.   Wt Readings from Last 3 Encounters:  03/29/22 276 lb (125.2 kg)  02/07/22 273 lb (123.8 kg)  02/01/22 273 lb (123.8 kg)      Review of Systems  Genitourinary:  Negative for flank pain.  Musculoskeletal:  Positive for back pain.  Neurological:  Negative for weakness and numbness.         Past Medical History:  Diagnosis Date   Acne    Anxiety    Depression    Kidney stone    Pre-diabetes     Social History   Socioeconomic History   Marital status: Divorced    Spouse name: Not on file   Number of children: Not on file   Years of education: Not on file   Highest education level: Not on file  Occupational History   Not on file  Tobacco Use   Smoking status: Former    Types: Cigarettes    Quit date: 04/05/2020    Years since quitting: 1.9    Passive exposure: Past   Smokeless tobacco: Never   Tobacco comments:    june 2021  Vaping Use   Vaping Use: Every day   Substances: Nicotine   Devices: occa  Substance and Sexual Activity   Alcohol use: Yes    Alcohol/week: 0.0 standard drinks    Comment: social   Drug use: No   Sexual activity: Not on file  Other Topics Concern   Not on file  Social History Narrative   Single   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      Social Determinants of Health   Financial Resource Strain: Not on file  Food Insecurity: Not on file  Transportation Needs: Not on file  Physical Activity: Not on file  Stress: Not on file  Social Connections: Not on file  Intimate Partner Violence: Not on file    Past Surgical History:  Procedure Laterality Date   CYSTOSCOPY W/ RETROGRADES Right 02/01/2022   Procedure: CYSTOSCOPY WITH RETROGRADE PYELOGRAM;  Surgeon: Billey Co, MD;  Location: [facility];  Service: Urology;  Laterality: Right;   CYSTOSCOPY W/ URETERAL STENT PLACEMENT Right 02/14/2016   Procedure: CYSTOSCOPY WITH STENT EXCHANGE;  Surgeon: Nickie Retort, MD;  Location: [facility];  Service: Urology;  Laterality: Right;   CYSTOSCOPY WITH STENT PLACEMENT Right 01/14/2016   Procedure: RIGHT CYSTOSCOPY, RIGHT RETROGRADE, RIGHT URETERAL STENT PLACEMENT;  Surgeon: Ardis Hughs, MD;  Location: [facility];  Service: Urology;  Laterality: Right;    CYSTOSCOPY/URETEROSCOPY/HOLMIUM LASER/STENT PLACEMENT Right 01/16/2022   Procedure: CYSTOSCOPY/URETEROSCOPY/ RIGHT URETERAL STENT PLACEMENT;  Surgeon: Billey Co, MD;  Location: [facility];  Service: Urology;  Laterality: Right;   CYSTOSCOPY/URETEROSCOPY/HOLMIUM LASER/STENT PLACEMENT Right 02/01/2022   Procedure: CYSTOSCOPY/URETEROSCOPY/HOLMIUM LASER/STENT PLACEMENT;  Surgeon: Billey Co, MD;  Location: [facility];  Service: Urology;  Laterality: Right;   HOLMIUM LASER APPLICATION Right [id number]   Procedure: HOLMIUM LASER APPLICATION;  Surgeon: Nickie Retort, MD;  Location: [facility];  Service: Urology;  Laterality: Right;   URETEROSCOPY WITH HOLMIUM LASER LITHOTRIPSY Right 02/14/2016   Procedure: URETEROSCOPY WITH HOLMIUM LASER LITHOTRIPSY;  Surgeon: Nickie Retort, MD;  Location: [facility];  Service: Urology;  Laterality: Right;    Family History  Problem Relation Age of Onset   Stroke Mother    Seizures Mother    Dementia Mother    Cancer Mother        Cervical?   Nephrolithiasis Mother    Hematuria Mother    Depression Mother    Hypertension Father    Arthritis Father    Heart disease Father    Depression Sister     Allergies  Allergen Reactions   Tramadol Nausea Only    Current Outpatient Medications on File Prior to Visit  Medication Sig Dispense Refill   acetaminophen (TYLENOL) 500 MG tablet Take 500 mg by mouth every 6 (six) hours as needed.     cetirizine (ZYRTEC) 10 MG tablet Take 10 mg by mouth daily.     DULoxetine (CYMBALTA) 20 MG capsule Take 1 capsule (20 mg total) by mouth 2 (two) times daily. 180 capsule 0   famotidine (PEPCID) 10 MG tablet Take 10 mg by mouth daily.     ibuprofen (ADVIL) 800 MG tablet Take 1 tablet (800 mg total) by mouth every 8 (eight) hours as needed. 30 tablet 0   L-Methylfolate 7.5 MG TABS Take 1 tablet (7.5 mg total) by mouth every morning. 30 tablet 3   lamoTRIgine (LAMICTAL) 25 MG tablet Take 1 tablet (25 mg total) by  mouth 2 (two) times daily. 180 tablet 0   magnesium 30 MG tablet Take 30 mg by mouth 2 (two) times daily.     traZODone (DESYREL) 50 MG tablet Take 0.5-1 tablets (25-50 mg total) by mouth at bedtime as needed for sleep. 90 tablet 0   No current facility-administered medications on file prior to visit.    BP 132/78   Pulse 86   Temp 98.6 F (37 C) (Temporal)   Ht 5\' 9"  (1.753 m)   Wt 276 lb (125.2 kg)   SpO2 99%   BMI 40.76 kg/m  Objective:   Physical Exam Cardiovascular:     Rate and Rhythm: Normal rate and regular rhythm.  Pulmonary:  Effort: Pulmonary effort is normal.     Breath sounds: Normal breath sounds.  Musculoskeletal:     Cervical back: Neck supple.     Lumbar back: No bony tenderness. Decreased range of motion. Negative right straight leg raise test and negative left straight leg raise test.       Back:     Comments: Decrease in ROM with extension and lateral rotation.   Skin:    General: Skin is warm and dry.          Assessment & Plan:

## 2022-03-29 NOTE — Patient Instructions (Signed)
Continue to work on [employer].  Start prednisone tablets. Take two tablets my mouth once daily for four days, then one tablet once daily for four days.   You may take cyclobenzaprine 5 mg at bedtime for muscle spasms/back pain. This will cause drowsiness.  Stretch and walk daily.   It was a pleasure to see you today!

## 2022-04-15 NOTE — Telephone Encounter (Signed)
Called patient she is going to wait and keep eye on it over night and let us know if she wants app. I have reviewed red words and if any she will be seen at ED

## 2022-05-01 ENCOUNTER — Telehealth (INDEPENDENT_AMBULATORY_CARE_PROVIDER_SITE_OTHER): Payer: Self-pay | Admitting: Psychiatry

## 2022-05-01 DIAGNOSIS — F3176 Bipolar disorder, in full remission, most recent episode depressed: Secondary | ICD-10-CM

## 2022-05-01 NOTE — Progress Notes (Signed)
No response to call or text or video invite  

## 2022-05-23 NOTE — Telephone Encounter (Signed)
I spoke with pt; pt said last 3 months or so pt has had lt ankle swelling initially swelling was on and off but now swelling consistent all the time. Pt said no known injury,no pain and no redness, no CP or SOB. Overnight swelling goes down minimally but within hour of being up the swelling is back. No swelling in rt ankle. Pt said for a long time she sits on lt ankle with leg under her when she sits down. Pt has been my charting with [name] NP and pt prefers to wait and see DR Copland next wk. Pt scheduled appt with Dr Patsy Lager on 05/27/22 at 2:40. UC & ED precautions given and pt voiced understanding. Sending note to Dr Patsy Lager who is out of office and Allayne Gitelman NP who is in office and PCP.

## 2022-05-23 NOTE — Telephone Encounter (Signed)
[facility] Night - Client Nonclinical Telephone Record  [facility] Client [facility] Night - Client Client Site [facility] - Night Provider Hannah Beat - MD Contact Type Call Who Is Calling Patient / Member / Family / Caregiver Caller Name Analeya Luallen Phone Number [phone] Patient Name Molly Miller Patient DOB 1981/05/24 Call Type Message Only Information Provided Reason for Call Request to Schedule Office Appointment Initial Comment Caller states she would like to schedule an appointment for left ankle. Patient request to speak to RN No Additional Comment Provided caller with office hours. Caller declined triage. Disp. Time Disposition Final User 05/23/2022 7:35:16 AM General Information Provided Yes Fenton Foy Call Closed By: Fenton Foy Transaction Date/Time: 05/23/2022 7:32:39 AM (ET

## 2022-05-24 ENCOUNTER — Other Ambulatory Visit: Payer: Self-pay | Admitting: Psychiatry

## 2022-05-24 DIAGNOSIS — F411 Generalized anxiety disorder: Secondary | ICD-10-CM

## 2022-05-24 NOTE — Telephone Encounter (Signed)
Thanks. I recommended Dr. Patsy Lager as she was mentioning sciatica and back pain The ankle swelling is new, I've not seen her for this. I do appreciate Dr. Patsy Lager seeing patient on Monday as I will be out of the office.

## 2022-05-27 ENCOUNTER — Ambulatory Visit (INDEPENDENT_AMBULATORY_CARE_PROVIDER_SITE_OTHER): Payer: 59 | Admitting: Family Medicine

## 2022-05-27 ENCOUNTER — Encounter: Payer: Self-pay | Admitting: Family Medicine

## 2022-05-27 VITALS — BP 128/78 | HR 93 | Temp 98.4°F | Ht 69.0 in | Wt 280.0 lb

## 2022-05-27 DIAGNOSIS — R600 Localized edema: Secondary | ICD-10-CM | POA: Diagnosis not present

## 2022-05-27 NOTE — Progress Notes (Signed)
Molleigh Huot T. Shadow Schedler, MD, CAQ Sports Medicine [facility] [address] [place], [address]  Phone: [phone]  FAX: [phone]  Molly Miller - 41 y.o. female  MRN [id number]  Date of Birth: 05-25-1981  Date: 05/27/2022  PCP: [name], NP  Referral: [name], NP  Chief Complaint  Patient presents with   Joint Swelling    Left Ankle   Subjective:   Molly Miller is a 41 y.o. very pleasant female patient with Body mass index is 41.35 kg/m. who presents with the following:  On chart review, the patient has been having some issues with lumbar radiculopathy for some time.  She has been to the chiropractor a few times.  There is also question of some left-sided foot pain and swelling.  I did do some chart review and reviewed the notes on my chart back-and-forth with the patient and her primary care doctor.  Today, she basically wants me to look at her left lower extremity and ankle.  She is not having any pain at all.  No focal pain in and about the ankle joint, foot or ankle.  She had no trauma or injury.  She is not limping at all and she has no pain.  She does have some swelling more prominent on the left side compared to the right and that is more prominent about the ankle compared to the remainder of the lower extremity.  She denies any ongoing chest pain.      Latest Ref Rng & Units 01/14/2022   12:24 AM 10/19/2021   12:39 PM 08/21/2020    9:18 AM  Hepatic Function  Total Protein 6.5 - 8.1 g/dL 7.2  7.2  6.2   Albumin 3.5 - 5.0 g/dL 3.9  4.3  4.0   AST 15 - 41 U/L 36  37  21   ALT 0 - 44 U/L 58  60  36   Alk Phosphatase 38 - 126 U/L 77  77  74   Total Bilirubin 0.3 - 1.2 mg/dL 0.4  0.7  0.6   Bilirubin, Direct 0.0 - 0.2 mg/dL <8.6       She does have a history of mild fatty liver changes seen on CT renal protocol.  Review of Systems is noted in the HPI, as appropriate  Objective:   BP 128/78    Pulse 93   Temp 98.4 F (36.9 C) (Oral)   Ht 5\' 9"  (1.753 m)   Wt 280 lb (127 kg)   LMP 05/14/2022   SpO2 96%   BMI 41.35 kg/m   GEN: No acute distress; alert,appropriate. PULM: Breathing comfortably in no respiratory distress PSYCH: Normally interactive.   The entirety of the bilateral foot and ankle exam is normal.  There is no bony tenderness.  All ligaments are stable.  All tendons are nontender.  Full range of motion and strength is 5/5 throughout.  She is entirely neurovascularly intact.  She does have some 1+ lower extremity edema.  This is 1 to not quite 2+ lower extremity edema on the left and 1+ lower extremity edema on the R.  Laboratory and Imaging Data:  Assessment and Plan:     ICD-10-CM   1. Bilateral lower extremity edema  R60.0 Basic metabolic panel    Hepatic function panel    Brain natriuretic peptide     Left greater than right lower extremity edema without clear etiology.  I have no concern  about the foot or ankle.  Benign orthopedic exam and history.  More likely dependent edema, may be some venous insufficiency.  Check renal function and liver function.  Would like to get a cardiac BNP to rule out potential heart failure.  Orders placed today for conditions managed today: Orders Placed This Encounter  Procedures   Basic metabolic panel   Hepatic function panel   Brain natriuretic peptide    Follow-up if needed: No follow-ups on file.  [employer] One speech-to-text software was used for transcription in this dictation.  Possible transcriptional errors can occur using Editor, commissioning.   Signed,  Maud Deed. Madyson Lukach, MD   Outpatient Encounter Medications as of 05/27/2022  Medication Sig   acetaminophen (TYLENOL) 500 MG tablet Take 500 mg by mouth every 6 (six) hours as needed.   cetirizine (ZYRTEC) 10 MG tablet Take 10 mg by mouth daily.   DULoxetine (CYMBALTA) 20 MG capsule Take 20 mg by mouth daily.   famotidine (PEPCID) 10 MG tablet  Take 10 mg by mouth daily.   L-Methylfolate 7.5 MG TABS Take 1 tablet (7.5 mg total) by mouth every morning.   lamoTRIgine (LAMICTAL) 25 MG tablet Take 1 tablet (25 mg total) by mouth 2 (two) times daily.   magnesium 30 MG tablet Take 30 mg by mouth 2 (two) times daily.   traZODone (DESYREL) 50 MG tablet Take 0.5-1 tablets (25-50 mg total) by mouth at bedtime as needed for sleep.   [DISCONTINUED] DULoxetine (CYMBALTA) 20 MG capsule Take 1 capsule (20 mg total) by mouth 2 (two) times daily. (Patient taking differently: Take 20 mg by mouth daily.)   [DISCONTINUED] cyclobenzaprine (FLEXERIL) 5 MG tablet Take 1 tablet (5 mg total) by mouth at bedtime as needed for muscle spasms.   [DISCONTINUED] ibuprofen (ADVIL) 800 MG tablet Take 1 tablet (800 mg total) by mouth every 8 (eight) hours as needed.   [DISCONTINUED] predniSONE (DELTASONE) 20 MG tablet Take two tablets my mouth once daily in the morning for four days, then one tablet once daily for four days.   No facility-administered encounter medications on file as of 05/27/2022.

## 2022-05-28 LAB — BASIC METABOLIC PANEL
BUN: 15 mg/dL (ref 6–23)
CO2: 25 mEq/L (ref 19–32)
Calcium: 9.1 mg/dL (ref 8.4–10.5)
Chloride: 105 mEq/L (ref 96–112)
Creatinine, Ser: 0.76 mg/dL (ref 0.40–1.20)
GFR: 97.41 mL/min (ref >60.00)
Glucose, Bld: 99 mg/dL (ref 70–99)
Potassium: 4.1 mEq/L (ref 3.5–5.1)
Sodium: 139 mEq/L (ref 135–145)

## 2022-05-28 LAB — HEPATIC FUNCTION PANEL
ALT: 35 U/L (ref 0–35)
AST: 26 U/L (ref 0–37)
Albumin: 4 g/dL (ref 3.5–5.2)
Alkaline Phosphatase: 76 U/L (ref 39–117)
Bilirubin, Direct: 0.1 mg/dL (ref 0.0–0.3)
Total Bilirubin: 0.4 mg/dL (ref 0.2–1.2)
Total Protein: 6.2 g/dL (ref 6.0–8.3)

## 2022-05-28 LAB — BRAIN NATRIURETIC PEPTIDE: Pro B Natriuretic peptide (BNP): 10 pg/mL (ref 0.0–100.0)

## 2022-06-01 ENCOUNTER — Other Ambulatory Visit: Payer: Self-pay | Admitting: Psychiatry

## 2022-06-01 DIAGNOSIS — F3181 Bipolar II disorder: Secondary | ICD-10-CM

## 2022-06-02 NOTE — Telephone Encounter (Signed)
Ordered refill of lamotrigine per request. Please contact the patient to make follow up appointment with Dr. Eappen. 

## 2022-06-11 ENCOUNTER — Other Ambulatory Visit: Payer: Self-pay | Admitting: Psychiatry

## 2022-06-13 ENCOUNTER — Telehealth (HOSPITAL_COMMUNITY): Payer: Self-pay | Admitting: Psychiatry

## 2022-06-13 NOTE — Telephone Encounter (Signed)
Left voicemail for patient too call [facility] to schedule appointment with provider secondary to request by covering provider after medication refill request.

## 2022-06-27 DIAGNOSIS — R600 Localized edema: Secondary | ICD-10-CM

## 2022-07-01 ENCOUNTER — Other Ambulatory Visit (INDEPENDENT_AMBULATORY_CARE_PROVIDER_SITE_OTHER): Payer: 59

## 2022-07-01 DIAGNOSIS — R600 Localized edema: Secondary | ICD-10-CM

## 2022-07-01 LAB — URIC ACID: Uric Acid, Serum: 5.2 mg/dL (ref 2.4–7.0)

## 2022-07-01 LAB — SEDIMENTATION RATE: Sed Rate: 13 mm/hr (ref 0–20)

## 2022-07-01 LAB — C-REACTIVE PROTEIN: CRP: 1.2 mg/dL (ref 0.5–20.0)

## 2022-07-03 LAB — RHEUMATOID FACTOR: Rheumatoid fact SerPl-aCnc: <14 IU/mL (ref <14)

## 2022-07-03 LAB — CYCLIC CITRUL PEPTIDE ANTIBODY, IGG: Cyclic Citrullin Peptide Ab: <16 UNITS

## 2022-07-04 NOTE — Telephone Encounter (Signed)
Can we get the spouse triaged please

## 2022-07-05 NOTE — Telephone Encounter (Signed)
I saw the note and found the pts name and I could not get intouch with [name] but I did speak with pt Molly Miller [phone]. Please see phone note in pts chart.

## 2022-07-07 ENCOUNTER — Other Ambulatory Visit: Payer: Self-pay | Admitting: Psychiatry

## 2022-07-07 DIAGNOSIS — F3181 Bipolar II disorder: Secondary | ICD-10-CM

## 2022-08-14 ENCOUNTER — Other Ambulatory Visit (HOSPITAL_COMMUNITY): Payer: Self-pay | Admitting: Psychiatry

## 2022-08-14 DIAGNOSIS — F3181 Bipolar II disorder: Secondary | ICD-10-CM

## 2022-08-15 ENCOUNTER — Telehealth (INDEPENDENT_AMBULATORY_CARE_PROVIDER_SITE_OTHER): Payer: 59 | Admitting: Psychiatry

## 2022-08-15 ENCOUNTER — Encounter: Payer: Self-pay | Admitting: Psychiatry

## 2022-08-15 DIAGNOSIS — F3181 Bipolar II disorder: Secondary | ICD-10-CM | POA: Diagnosis not present

## 2022-08-15 DIAGNOSIS — G4701 Insomnia due to medical condition: Secondary | ICD-10-CM | POA: Diagnosis not present

## 2022-08-15 DIAGNOSIS — F411 Generalized anxiety disorder: Secondary | ICD-10-CM | POA: Diagnosis not present

## 2022-08-15 MED ORDER — LAMOTRIGINE 25 MG PO TABS: ORAL_TABLET | ORAL | 0 refills | Status: DC

## 2022-08-15 NOTE — Progress Notes (Signed)
Virtual Visit via Video Note  I connected with Molly Miller on 08/15/22 at  4:20 PM EDT by a video enabled telemedicine application and verified that I am speaking with the correct person using two identifiers.  Location Provider Location : [facility] Patient Location : Work  Participants: Patient , Provider    I discussed the limitations of evaluation and management by telemedicine and the availability of in person appointments. The patient expressed understanding and agreed to proceed.    I discussed the assessment and treatment plan with the patient. The patient was provided an opportunity to ask questions and all were answered. The patient agreed with the plan and demonstrated an understanding of the instructions.   The patient was advised to call back or seek an in-person evaluation if the symptoms worsen or if the condition fails to improve as anticipated.   BH MD OP Progress Note  08/16/2022 9:13 AM Molly Miller  MRN:  [id number]  Chief Complaint:  Chief Complaint  Patient presents with   Follow-up   Anxiety   Depression   HPI: Molly Miller is a 41 year old Caucasian female, divorced, currently lives with her boyfriend in [place], has a history of bipolar disorder type II, GAD was evaluated by telemedicine today.  Patient today reports her dad was diagnosed with early dementia.  She and her siblings are taking down to sit with him and help him.  He needs a lot of support.  They are trying to get him some help at home however that has been difficult.  Patient reports due to her dad's diagnosis, she has been more anxious and depressed lately.  Struggles with sadness, low motivation, low energy, sleep problems.  Has been unable to sleep much at night.  Does have trazodone available however does not want to take it since it makes her sluggish the next day.  Does not want to start another sleep medication at this time.  Patient also reports she has been worrying a lot  about her current situational stressors and often feels on edge.  This has been getting worse since the past few weeks.  She does have Cymbalta prescribed as 40 mg daily however she has been only taking at 20 mg daily.  Agreeable to go up on the dosage as discussed.  Continues to be compliant on the Lamictal.  Denies side effects.  Patient reports she is not interested in psychotherapy, she does have good support from friends and would like to continue to talk to them.  Denies any suicidality, homicidality or perceptual disturbances.  Denies any other concerns today.  Visit Diagnosis:    ICD-10-CM   1. Bipolar 2 disorder (HCC)  F31.81 lamoTRIgine (LAMICTAL) 25 MG tablet   depressed with mixed features, mild    2. GAD (generalized anxiety disorder)  F41.1     3. Insomnia due to medical condition  G47.01    mood      Past Psychiatric History: Reviewed past psychiatric history from progress note on 08/09/2020.  Past trials of venlafaxine.  Past Medical History:  Past Medical History:  Diagnosis Date   Acne    Anxiety    Depression    Kidney stone    Pre-diabetes     Past Surgical History:  Procedure Laterality Date   CYSTOSCOPY W/ RETROGRADES Right 02/01/2022   Procedure: CYSTOSCOPY WITH RETROGRADE PYELOGRAM;  Surgeon: Sondra Come, MD;  Location: [facility];  Service: Urology;  Laterality: Right;   CYSTOSCOPY W/ URETERAL STENT  PLACEMENT Right 02/14/2016   Procedure: CYSTOSCOPY WITH STENT EXCHANGE;  Surgeon: Nickie Retort, MD;  Location: [facility];  Service: Urology;  Laterality: Right;   CYSTOSCOPY WITH STENT PLACEMENT Right 01/14/2016   Procedure: RIGHT CYSTOSCOPY, RIGHT RETROGRADE, RIGHT URETERAL STENT PLACEMENT;  Surgeon: Ardis Hughs, MD;  Location: [facility];  Service: Urology;  Laterality: Right;   CYSTOSCOPY/URETEROSCOPY/HOLMIUM LASER/STENT PLACEMENT Right 01/16/2022   Procedure: CYSTOSCOPY/URETEROSCOPY/ RIGHT URETERAL STENT PLACEMENT;  Surgeon: Billey Co, MD;  Location: [facility];  Service: Urology;  Laterality: Right;   CYSTOSCOPY/URETEROSCOPY/HOLMIUM LASER/STENT PLACEMENT Right 02/01/2022   Procedure: CYSTOSCOPY/URETEROSCOPY/HOLMIUM LASER/STENT PLACEMENT;  Surgeon: Billey Co, MD;  Location: [facility];  Service: Urology;  Laterality: Right;   HOLMIUM LASER APPLICATION Right 5/62/1308   Procedure: HOLMIUM LASER APPLICATION;  Surgeon: Nickie Retort, MD;  Location: [facility];  Service: Urology;  Laterality: Right;   URETEROSCOPY WITH HOLMIUM LASER LITHOTRIPSY Right 02/14/2016   Procedure: URETEROSCOPY WITH HOLMIUM LASER LITHOTRIPSY;  Surgeon: Nickie Retort, MD;  Location: [facility];  Service: Urology;  Laterality: Right;    Family Psychiatric History: Reviewed family psychiatric history from progress note on 08/09/2020.  Family History:  Family History  Problem Relation Age of Onset   Stroke Mother    Seizures Mother    Dementia Mother    Cancer Mother        Cervical?   Nephrolithiasis Mother    Hematuria Mother    Depression Mother    Hypertension Father    Arthritis Father    Heart disease Father    Depression Sister     Social History: Reviewed social history from progress note on 08/09/2020. Social History   Socioeconomic History   Marital status: Divorced    Spouse name: Not on file   Number of children: Not on file   Years of education: Not on file   Highest education level: Not on file  Occupational History   Not on file  Tobacco Use   Smoking status: Former    Types: Cigarettes    Quit date: 04/05/2020    Years since quitting: 2.3    Passive exposure: Past   Smokeless tobacco: Never   Tobacco comments:    june 2021  Vaping Use   Vaping Use: Every day   Substances: Nicotine   Devices: occa  Substance and Sexual Activity   Alcohol use: Yes    Alcohol/week: 0.0 standard drinks of alcohol    Comment: social   Drug use: No   Sexual activity: Not on file  Other Topics Concern   Not on file  Social  History Narrative   Single   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      Social Determinants of Health   Financial Resource Strain: Not on file  Food Insecurity: Not on file  Transportation Needs: Not on file  Physical Activity: Not on file  Stress: Not on file  Social Connections: Not on file    Allergies:  Allergies  Allergen Reactions   Tramadol Nausea Only    Metabolic Disorder Labs: Lab Results  Component Value Date   HGBA1C 5.9 10/19/2021   No results found for: "PROLACTIN" Lab Results  Component Value Date   CHOL 183 10/19/2021   TRIG 256.0 (H) 10/19/2021   HDL 35.90 (L) 10/19/2021   CHOLHDL 5 10/19/2021   VLDL 51.2 (H) 10/19/2021   Lab Results  Component Value Date   TSH 1.70  08/21/2020   TSH 1.520 06/02/2019    Therapeutic Level Labs: No results found for: "LITHIUM" No results found for: "VALPROATE" No results found for: "CBMZ"  Current Medications: Current Outpatient Medications  Medication Sig Dispense Refill   acetaminophen (TYLENOL) 500 MG tablet Take 500 mg by mouth every 6 (six) hours as needed.     cetirizine (ZYRTEC) 10 MG tablet Take 10 mg by mouth daily.     DULoxetine (CYMBALTA) 20 MG capsule TAKE 1 CAPSULE(20 MG) BY MOUTH TWICE DAILY 180 capsule 0   famotidine (PEPCID) 10 MG tablet Take 10 mg by mouth daily.     L-Methylfolate 7.5 MG TABS Take 1 tablet (7.5 mg total) by mouth every morning. 30 tablet 3   magnesium 30 MG tablet Take 30 mg by mouth 2 (two) times daily.     lamoTRIgine (LAMICTAL) 25 MG tablet TAKE 1 TABLET(25 MG) BY MOUTH TWICE DAILY 180 tablet 0   traZODone (DESYREL) 50 MG tablet Take 0.5-1 tablets (25-50 mg total) by mouth at bedtime as needed for sleep. (Patient not taking: Reported on 08/15/2022) 90 tablet 0   No current facility-administered medications for this visit.     Musculoskeletal: Strength & Muscle Tone:  UTA Gait & Station:  Seated Patient leans:  N/A  Psychiatric Specialty Exam: Review of Systems  Musculoskeletal:  Positive for back pain.  Psychiatric/Behavioral:  Positive for decreased concentration, dysphoric mood and sleep disturbance. The patient is nervous/anxious.   All other systems reviewed and are negative.   There were no vitals taken for this visit.There is no height or weight on file to calculate BMI.  General Appearance: Casual  Eye Contact:  Fair  Speech:  Normal Rate  Volume:  Normal  Mood:  Anxious and Depressed  Affect:  Congruent  Thought Process:  Goal Directed and Descriptions of Associations: Intact  Orientation:  Full (Time, Place, and Person)  Thought Content: Logical   Suicidal Thoughts:  No  Homicidal Thoughts:  No  Memory:  Immediate;   Fair Recent;   Fair Remote;   Fair  Judgement:  Fair  Insight:  Fair  Psychomotor Activity:  Normal  Concentration:  Concentration: Fair and Attention Span: Fair  Recall:  [employer] of Knowledge: Fair  Language: Fair  Akathisia:  No  Handed:  Right  AIMS (if indicated): done  Assets:  Communication Skills Desire for Improvement Housing Resilience Social Support Talents/Skills Transportation  ADL's:  Intact  Cognition: WNL  Sleep:  Poor   Screenings: AIMS    Flowsheet Row Video Visit from 08/15/2022 in [facility]  AIMS Total Score 0      GAD-7    Flowsheet Row Video Visit from 08/15/2022 in [facility] Video Visit from 03/07/2022 in [facility] Video Visit from 01/09/2022 in [facility] Video Visit from 08/09/2020 in [facility] Office Visit from 10/09/2017 in [facility]  Total GAD-7 Score 13 6 5 14 11       PHQ2-9    Flowsheet Row Video Visit from 08/15/2022 in [facility] Video Visit from 03/07/2022 in [facility] Video  Visit from 01/09/2022 in [facility] Video Visit from 10/09/2021 in [facility] Office Visit from 09/11/2021 in [facility]  PHQ-2 Total Score 2 1 0 2 3  PHQ-9 Total Score 11 4 -- 10 16      Flowsheet Row Video Visit  from 08/15/2022 in [facility] Video Visit from 03/07/2022 in [facility] Admission (Discharged) from 02/01/2022 in [facility]  C-SSRS RISK CATEGORY No Risk No Risk No Risk        Assessment and Plan: Molly Miller is a 41 year old Caucasian female, employed, divorced, lives in [place], has a history of bipolar disorder, GAD was evaluated by telemedicine today.  Patient is currently struggling with worsening mood symptoms, does have situational stressors as noted above, will benefit from the following plan.  Plan Bipolar disorder type II depressed-unstable We will consider increasing Lamictal.  However will continue Lamictal 25 mg p.o. twice daily for now since Cymbalta dosage is being readjusted.   GAD-unstable Increase Cymbalta to 20 mg p.o. twice daily Patient did not follow instructions to do so last visit. Discussed referral for CBT again, patient declines. Hydroxyzine 25-50 mg p.o. nightly as needed Deplin 7.5 mg p.o. daily for folic acid conversion reduction.  Insomnia-unstable Trazodone 25-50 mg p.o. nightly as needed Discussed sleep hygiene techniques.  Discussed taking melatonin or Relaxium OTC if she is not interested in continuing on the trazodone every night.  Follow-up in clinic in 4 weeks or sooner if needed.     Consent: Patient/Guardian gives verbal consent for treatment and assignment of benefits for services provided during this visit. Patient/Guardian expressed understanding and agreed to proceed.   This note was generated in part or whole with voice recognition  software. Voice recognition is usually quite accurate but there are transcription errors that can and very often do occur. I apologize for any typographical errors that were not detected and corrected.      Jomarie Longs, MD 08/16/2022, 9:13 AM

## 2022-09-19 ENCOUNTER — Ambulatory Visit: Payer: 59 | Admitting: Psychiatry

## 2022-10-02 ENCOUNTER — Encounter: Payer: Self-pay | Admitting: Urology

## 2022-10-05 ENCOUNTER — Other Ambulatory Visit: Payer: Self-pay | Admitting: Psychiatry

## 2022-10-07 ENCOUNTER — Telehealth: Payer: Self-pay | Admitting: Psychiatry

## 2022-10-07 NOTE — Telephone Encounter (Signed)
Patient scheduled appt for 10-23-22. Asking to refill Cymbalta. She is out of the medication.

## 2022-10-07 NOTE — Telephone Encounter (Signed)
DULoxetine (CYMBALTA) 20 MG capsule 180 capsule 0 10/07/2022    Sig: TAKE 1 CAPSULE(20 MG) BY MOUTH TWICE DAILY   Sent to pharmacy as: DULoxetine (CYMBALTA) 20 MG capsule   E-Prescribing Status: Receipt confirmed by pharmacy (10/07/2022  8:33 AM EST     Cymbalta was approved this morning after I received a refill request from her pharmacy.  Please let patient know.

## 2022-10-23 ENCOUNTER — Telehealth (INDEPENDENT_AMBULATORY_CARE_PROVIDER_SITE_OTHER): Payer: 59 | Admitting: Psychiatry

## 2022-10-23 ENCOUNTER — Encounter: Payer: Self-pay | Admitting: Psychiatry

## 2022-10-23 DIAGNOSIS — F3181 Bipolar II disorder: Secondary | ICD-10-CM | POA: Diagnosis not present

## 2022-10-23 DIAGNOSIS — F411 Generalized anxiety disorder: Secondary | ICD-10-CM

## 2022-10-23 DIAGNOSIS — G4701 Insomnia due to medical condition: Secondary | ICD-10-CM

## 2022-10-23 MED ORDER — LAMOTRIGINE 25 MG PO TABS: ORAL_TABLET | ORAL | 0 refills | Status: DC

## 2022-10-23 MED ORDER — L-METHYLFOLATE 7.5 MG PO TABS: 7.5000 mg | ORAL_TABLET | Freq: Every morning | ORAL | 3 refills | Status: DC

## 2022-10-23 NOTE — Progress Notes (Signed)
Virtual Visit via Video Note  I connected with Crysta Gulick Fetterly on 10/23/22 at 10:00 AM EST by a video enabled telemedicine application and verified that I am speaking with the correct person using two identifiers.  Location Provider Location : [facility] Patient Location : Work  Participants: Patient , Provider    I discussed the limitations of evaluation and management by telemedicine and the availability of in person appointments. The patient expressed understanding and agreed to proceed.   I discussed the assessment and treatment plan with the patient. The patient was provided an opportunity to ask questions and all were answered. The patient agreed with the plan and demonstrated an understanding of the instructions.   The patient was advised to call back or seek an in-person evaluation if the symptoms worsen or if the condition fails to improve as anticipated.   BH MD OP Progress Note  10/23/2022 12:43 PM Molly Miller  MRN:  [id number]  Chief Complaint:  Chief Complaint  Patient presents with   Follow-up   Medication Refill   Anxiety   Depression   HPI: Molly Miller is a 41 year old Caucasian female, divorced, currently lives with her boyfriend in [place], has a history of bipolar disorder type II, GAD was evaluated by telemedicine today.  Patient today reports she is doing better on the higher dosage of Cymbalta.  That does help her with her anxiety symptoms.  She continues to have episodes of restlessness, inability to relax and feeling overwhelmed however she has been coping better than before.  Patient denies any significant depression, irritability or mood swings at this time.  Reports sleep as better now.  She does use the trazodone as needed and that helps.  Reports she continues to take care of her dad who needs a lot of help and has medical problems.  She however has been coping okay.  Denies side effects to any of her medications.  Denies suicidality,  homicidality or perceptual disturbances.  Patient denies any other concerns today.  Visit Diagnosis:    ICD-10-CM   1. Bipolar II disorder, mild, depressed, with mixed features, in full remission (HCC)  F31.81 lamoTRIgine (LAMICTAL) 25 MG tablet    2. GAD (generalized anxiety disorder)  F41.1 L-Methylfolate 7.5 MG TABS    3. Insomnia due to medical condition  G47.01    Anxiety      Past Psychiatric History: Reviewed past psychiatric history from progress note on 08/09/2020.  Past trials of venlafaxine.  Past Medical History:  Past Medical History:  Diagnosis Date   Acne    Anxiety    Depression    Kidney stone    Pre-diabetes     Past Surgical History:  Procedure Laterality Date   CYSTOSCOPY W/ RETROGRADES Right 02/01/2022   Procedure: CYSTOSCOPY WITH RETROGRADE PYELOGRAM;  Surgeon: Sondra Come, MD;  Location: [facility];  Service: Urology;  Laterality: Right;   CYSTOSCOPY W/ URETERAL STENT PLACEMENT Right 02/14/2016   Procedure: CYSTOSCOPY WITH STENT EXCHANGE;  Surgeon: Hildred Laser, MD;  Location: [facility];  Service: Urology;  Laterality: Right;   CYSTOSCOPY WITH STENT PLACEMENT Right 01/14/2016   Procedure: RIGHT CYSTOSCOPY, RIGHT RETROGRADE, RIGHT URETERAL STENT PLACEMENT;  Surgeon: Crist Fat, MD;  Location: [facility];  Service: Urology;  Laterality: Right;   CYSTOSCOPY/URETEROSCOPY/HOLMIUM LASER/STENT PLACEMENT Right 01/16/2022   Procedure: CYSTOSCOPY/URETEROSCOPY/ RIGHT URETERAL STENT PLACEMENT;  Surgeon: Sondra Come, MD;  Location: [facility];  Service: Urology;  Laterality: Right;   CYSTOSCOPY/URETEROSCOPY/HOLMIUM LASER/STENT PLACEMENT  Right 02/01/2022   Procedure: CYSTOSCOPY/URETEROSCOPY/HOLMIUM LASER/STENT PLACEMENT;  Surgeon: Sondra ComeSninsky, Brian C, MD;  Location: [facility];  Service: Urology;  Laterality: Right;   HOLMIUM LASER APPLICATION Right 02/14/2016   Procedure: HOLMIUM LASER APPLICATION;  Surgeon: Hildred LaserBrian James Budzyn, MD;  Location: [facility];  Service:  Urology;  Laterality: Right;   URETEROSCOPY WITH HOLMIUM LASER LITHOTRIPSY Right 02/14/2016   Procedure: URETEROSCOPY WITH HOLMIUM LASER LITHOTRIPSY;  Surgeon: Hildred LaserBrian James Budzyn, MD;  Location: [facility];  Service: Urology;  Laterality: Right;    Family Psychiatric History: Reviewed family psychiatric history from progress note on 08/09/2020.  Family History:  Family History  Problem Relation Age of Onset   Stroke Mother    Seizures Mother    Dementia Mother    Cancer Mother        Cervical?   Nephrolithiasis Mother    Hematuria Mother    Depression Mother    Hypertension Father    Arthritis Father    Heart disease Father    Depression Sister     Social History: Reviewed social history from progress note on 08/09/2020. Social History   Socioeconomic History   Marital status: Divorced    Spouse name: Not on file   Number of children: Not on file   Years of education: Not on file   Highest education level: Not on file  Occupational History   Not on file  Tobacco Use   Smoking status: Former    Types: Cigarettes    Quit date: 04/05/2020    Years since quitting: 2.5    Passive exposure: Past   Smokeless tobacco: Never   Tobacco comments:    june 2021  Vaping Use   Vaping Use: Every day   Substances: Nicotine   Devices: occa  Substance and Sexual Activity   Alcohol use: Yes    Alcohol/week: 0.0 standard drinks of alcohol    Comment: social   Drug use: No   Sexual activity: Not on file  Other Topics Concern   Not on file  Social History Narrative   Single   Works at [employer]   Enjoys shopping and antique and [employer], gardening.      Social Determinants of Health   Financial Resource Strain: Not on file  Food Insecurity: Not on file  Transportation Needs: Not on file  Physical Activity: Not on file  Stress: Not on file  Social Connections: Not on file    Allergies:  Allergies  Allergen Reactions   Tramadol Nausea Only    Metabolic Disorder  Labs: Lab Results  Component Value Date   HGBA1C 5.9 10/19/2021   No results found for: "PROLACTIN" Lab Results  Component Value Date   CHOL 183 10/19/2021   TRIG 256.0 (H) 10/19/2021   HDL 35.90 (L) 10/19/2021   CHOLHDL 5 10/19/2021   VLDL 51.2 (H) 10/19/2021   Lab Results  Component Value Date   TSH 1.70 08/21/2020   TSH 1.520 06/02/2019    Therapeutic Level Labs: No results found for: "LITHIUM" No results found for: "VALPROATE" No results found for: "CBMZ"  Current Medications: Current Outpatient Medications  Medication Sig Dispense Refill   cetirizine (ZYRTEC) 10 MG tablet Take 10 mg by mouth daily.     DULoxetine (CYMBALTA) 20 MG capsule TAKE 1 CAPSULE(20 MG) BY MOUTH TWICE DAILY 180 capsule 0   famotidine (PEPCID) 10 MG tablet Take 10 mg by mouth daily.     magnesium 30 MG tablet Take 30 mg by mouth  2 (two) times daily.     traZODone (DESYREL) 50 MG tablet Take 0.5-1 tablets (25-50 mg total) by mouth at bedtime as needed for sleep. 90 tablet 0   acetaminophen (TYLENOL) 500 MG tablet Take 500 mg by mouth every 6 (six) hours as needed. (Patient not taking: Reported on 10/23/2022)     L-Methylfolate 7.5 MG TABS Take 1 tablet (7.5 mg total) by mouth every morning. 30 tablet 3   lamoTRIgine (LAMICTAL) 25 MG tablet TAKE 1 TABLET(25 MG) BY MOUTH TWICE DAILY 180 tablet 0   No current facility-administered medications for this visit.     Musculoskeletal: Strength & Muscle Tone:  UTA Gait & Station: normal Patient leans: N/A  Psychiatric Specialty Exam: Review of Systems  Psychiatric/Behavioral:  The patient is nervous/anxious.   All other systems reviewed and are negative.   There were no vitals taken for this visit.There is no height or weight on file to calculate BMI.  General Appearance: Casual  Eye Contact:  Fair  Speech:  Clear and Coherent  Volume:  Normal  Mood:  Anxious Coping ok  Affect:  Appropriate  Thought Process:  Goal Directed and Descriptions of  Associations: Intact  Orientation:  Full (Time, Place, and Person)  Thought Content: Logical   Suicidal Thoughts:  No  Homicidal Thoughts:  No  Memory:  Immediate;   Fair Recent;   Fair Remote;   Fair  Judgement:  Fair  Insight:  Fair  Psychomotor Activity:  Normal  Concentration:  Concentration: Fair and Attention Span: Fair  Recall:  [employer] of Knowledge: Fair  Language: Fair  Akathisia:  No  Handed:  Right  AIMS (if indicated): not done  Assets:  Communication Skills Desire for Improvement Housing Social Support  ADL's:  Intact  Cognition: WNL  Sleep:  Fair   Screenings: AIMS    Flowsheet Row Video Visit from 08/15/2022 in [facility]  AIMS Total Score 0      GAD-7    Flowsheet Row Video Visit from 10/23/2022 in [facility] Video Visit from 08/15/2022 in [facility] Video Visit from 03/07/2022 in [facility] Video Visit from 01/09/2022 in [facility] Video Visit from 08/09/2020 in [facility]  Total GAD-7 Score 3 13 6 5 14       PHQ2-9    Flowsheet Row Video Visit from 10/23/2022 in [facility] Video Visit from 08/15/2022 in [facility] Video Visit from 03/07/2022 in [facility] Video Visit from 01/09/2022 in [facility] Video Visit from 10/09/2021 in [facility]  PHQ-2 Total Score 1 2 1  0 2  PHQ-9 Total Score -- 11 4 -- 10      Flowsheet Row Video Visit from 10/23/2022 in [facility] Video Visit from 08/15/2022 in [facility] Video Visit from 03/07/2022 in [facility]  C-SSRS RISK CATEGORY No Risk No Risk No Risk        Assessment and Plan: Molly Miller is a  40 year old Caucasian female, employed, divorced, lives in [place], has a history of bipolar disorder, GAD was evaluated by telemedicine today.  Patient is currently stable.  Plan as noted below.  Plan Bipolar disorder type II mixed most recent episode depressed in remission Lamictal 25 mg p.o. twice daily Cymbalta 20 mg p.o. twice daily  GAD-improving Cymbalta 20 mg p.o. twice daily Hydroxyzine 25-50 mg  p.o. nightly as needed Deplin 7.5 mg p.o. daily for folic acid conversion reduction.  Insomnia-improving Trazodone 25-50 mg p.o. nightly as needed Continue sleep hygiene techniques  Follow-up in clinic in 3 months or sooner if needed.  Consent: Patient/Guardian gives verbal consent for treatment and assignment of benefits for services provided during this visit. Patient/Guardian expressed understanding and agreed to proceed.   This note was generated in part or whole with voice recognition software. Voice recognition is usually quite accurate but there are transcription errors that can and very often do occur. I apologize for any typographical errors that were not detected and corrected.      Jomarie Longs, MD 10/23/2022, 12:43 PM

## 2023-01-10 ENCOUNTER — Ambulatory Visit (INDEPENDENT_AMBULATORY_CARE_PROVIDER_SITE_OTHER): Payer: 59

## 2023-01-10 ENCOUNTER — Ambulatory Visit (INDEPENDENT_AMBULATORY_CARE_PROVIDER_SITE_OTHER): Payer: 59 | Admitting: Physical Medicine and Rehabilitation

## 2023-01-10 ENCOUNTER — Encounter: Payer: Self-pay | Admitting: Physical Medicine and Rehabilitation

## 2023-01-10 DIAGNOSIS — M79605 Pain in left leg: Secondary | ICD-10-CM

## 2023-01-10 DIAGNOSIS — G8929 Other chronic pain: Secondary | ICD-10-CM

## 2023-01-10 DIAGNOSIS — M7918 Myalgia, other site: Secondary | ICD-10-CM

## 2023-01-10 DIAGNOSIS — M5416 Radiculopathy, lumbar region: Secondary | ICD-10-CM | POA: Diagnosis not present

## 2023-01-10 DIAGNOSIS — M5442 Lumbago with sciatica, left side: Secondary | ICD-10-CM

## 2023-01-10 DIAGNOSIS — Z6841 Body Mass Index (BMI) 40.0 and over, adult: Secondary | ICD-10-CM

## 2023-01-10 NOTE — Progress Notes (Unsigned)
Molly Miller - 42 y.o. female MRN [id number]  Date of birth: [birth year]  Office Visit Note: Visit Date: 01/10/2023 PCP: Pleas Koch, NP Referred by: Pleas Koch, NP  Subjective: Chief Complaint  Patient presents with   Lower Back - Pain   HPI: Molly Miller is a 42 y.o. female who comes in today as a self referral for evaluation of chronic, worsening and severe left sided lower back pain radiating to buttock and down posterolateral leg to foot. Pain ongoing for over a year, worsens with sitting. States she recently purchased standing desk for work. She describes her pain as a sharp, aching and burning, currently rates as 7 out of 10. Some relief of pain with home exercise regimen, heat/ice, rest and use of medications. She has undergone multiple chiropractic treatments in [place], [place] over the last year with minimal relief of pain. States chiropractic treatments were becoming costly over time. No prior lumbar MRI imaging. No history of lumbar epidural steroid injections. Patient denies focal weakness, numbness and tingling. No recent trauma or falls.   Of note, she also reports chronic issues with left ankle swelling. Previously evaluated by Dr. Frederico Hamman Copland with [facility], per his notes feels this is dependent edema, unlikely related to orthopedic pathology.    Oswestry Disability Index Score 52% 20 to 30 (60%) severe disability: Pain remains the main problem in this group but activities of daily living are affected. These patients require a detailed investigation.  Review of Systems  Musculoskeletal:  Positive for back pain.  Neurological:  Negative for tingling, sensory change, focal weakness and weakness.  All other systems reviewed and are negative.  Otherwise per HPI.  Assessment & Plan: Visit Diagnoses:    ICD-10-CM   1. Chronic left-sided low back pain with left-sided sciatica  M54.42    G89.29     2. Lumbar radiculopathy  M54.16 XR  Lumbar Spine 2-3 Views    MR LUMBAR SPINE WO CONTRAST    3. Buttock pain  M79.18 MR LUMBAR SPINE WO CONTRAST    4. Pain in left leg  M79.605 MR LUMBAR SPINE WO CONTRAST    5. Class 3 severe obesity due to excess calories with serious comorbidity and body mass index (BMI) of 40.0 to 44.9 in adult [facility])  E66.01    Z68.41        Plan: Findings:  1. Chronic, worsening and severe left sided lower back pain radiating to buttock and down posterolateral leg to foot. Patient continues to have severe pain despite good conservative therapies such as chiropractic treatments, home exercise regimen, rest and use of medications. Patients clinical presentation and exam are consistent with L5 nerve pattern. I did obtain 2 view lumbar x-rays in the office today, degenerative changes and biforaminal narrowing noted at L5-S1, no listhesis. Next step is to obtain lumbar MRI imaging. Depending on results of MRI imaging we did discuss possibility of epidural steroid injection. I discussed injection procedure with her in detail today and provided her with educational material to take home and review. Patient instructed to take Tylenol 500 mg TID, could also look at re-grouping with our in house physical therapy team. No red flag symptoms noted upon exam today.   2. Chronic swelling to left ankle. 1+ edema noted to left ankle upon exam today. I do not feel this is not directly related to her spine, could be dependent edema due to inactivity. I did encourage patient to elevate legs and remain  active as tolerated.         Meds & Orders: No orders of the defined types were placed in this encounter.   Orders Placed This Encounter  Procedures   XR Lumbar Spine 2-3 Views   MR LUMBAR SPINE WO CONTRAST    Follow-up: Return for follow up for lumbar MRI review.   Procedures: No procedures performed      Clinical History: No specialty comments available.   She reports that she quit smoking about 2 years ago. Her  smoking use included cigarettes. She has been exposed to tobacco smoke. She has never used smokeless tobacco.  Recent Labs    07/01/22 0754  LABURIC 5.2    Objective:  VS:  HT:    WT:   BMI:     BP:   HR: bpm  TEMP: ( )  RESP:  Physical Exam Vitals and nursing note reviewed.  HENT:     Head: Normocephalic and atraumatic.     Nose: Nose normal.     Mouth/Throat:     Mouth: Mucous membranes are moist.  Eyes:     Extraocular Movements: Extraocular movements intact.  Cardiovascular:     Rate and Rhythm: Normal rate.     Pulses: Normal pulses.  Pulmonary:     Effort: Pulmonary effort is normal.  Abdominal:     General: Abdomen is flat. There is no distension.  Musculoskeletal:        General: Tenderness present.     Cervical back: Normal range of motion.     Comments: Patient rises from seated position to standing without difficulty. Good lumbar range of motion. No pain noted with facet loading. 5/5 strength noted with bilateral hip flexion, knee flexion/extension, ankle dorsiflexion/plantarflexion and EHL. No clonus noted bilaterally. No pain upon palpation of greater trochanters. No pain with internal/external rotation of bilateral hips. Sensation intact bilaterally. Dysesthesias noted to left L5 dermatome. Ambulates without aid, gait steady.     Skin:    General: Skin is warm and dry.     Capillary Refill: Capillary refill takes less than 2 seconds.  Neurological:     General: No focal deficit present.     Mental Status: She is alert and oriented to person, place, and time.  Psychiatric:        Mood and Affect: Mood normal.        Behavior: Behavior normal.     Ortho Exam  Imaging: XR Lumbar Spine 2-3 Views  Result Date: 01/10/2023 AP and lateral radiographs of the lumbar spine exhibit normal anatomical alignment. No spondylolisthesis noted. Well preserved joint spacing. Mild degenerative facet changes with biforaminal narrowing at L5-S1. No acute fractures or  dislocations.   Past Medical/Family/Surgical/Social History: Medications & Allergies reviewed per EMR, new medications updated. Patient Active Problem List   Diagnosis Date Noted   Acute low back pain 03/29/2022   Prediabetes 10/19/2021   Elevated blood pressure reading 09/11/2021   Sinus pressure 02/27/2021   Insomnia due to medical condition 09/11/2020   Bipolar 2 disorder ([place]) 08/09/2020   GAD (generalized anxiety disorder) 08/09/2020   At risk for prolonged QT interval syndrome 08/09/2020   Encounter for initial prescription of contraceptives 03/12/2019   Acute left ankle pain 01/27/2019   Abdominal bloating 10/16/2018   Preventative health care 10/16/2018   Seasonal allergic rhinitis 10/09/2017   Bilateral edema of lower extremity 04/29/2016   Anxiety and depression 03/30/2015   Class 3 severe obesity due to excess calories with body  mass index (BMI) of 40.0 to 44.9 in adult [facility]) 03/30/2015   Past Medical History:  Diagnosis Date   Acne    Anxiety    Depression    Kidney stone    Pre-diabetes    Family History  Problem Relation Age of Onset   Stroke Mother    Seizures Mother    Dementia Mother    Cancer Mother        Cervical?   Nephrolithiasis Mother    Hematuria Mother    Depression Mother    Hypertension Father    Arthritis Father    Heart disease Father    Depression Sister    Past Surgical History:  Procedure Laterality Date   CYSTOSCOPY W/ RETROGRADES Right 02/01/2022   Procedure: CYSTOSCOPY WITH RETROGRADE PYELOGRAM;  Surgeon: Billey Co, MD;  Location: [facility];  Service: Urology;  Laterality: Right;   CYSTOSCOPY W/ URETERAL STENT PLACEMENT Right 02/14/2016   Procedure: CYSTOSCOPY WITH STENT EXCHANGE;  Surgeon: Nickie Retort, MD;  Location: [facility];  Service: Urology;  Laterality: Right;   CYSTOSCOPY WITH STENT PLACEMENT Right 01/14/2016   Procedure: RIGHT CYSTOSCOPY, RIGHT RETROGRADE, RIGHT URETERAL STENT PLACEMENT;  Surgeon: Ardis Hughs, MD;  Location: [facility];  Service: Urology;  Laterality: Right;   CYSTOSCOPY/URETEROSCOPY/HOLMIUM LASER/STENT PLACEMENT Right 01/16/2022   Procedure: CYSTOSCOPY/URETEROSCOPY/ RIGHT URETERAL STENT PLACEMENT;  Surgeon: Billey Co, MD;  Location: [facility];  Service: Urology;  Laterality: Right;   CYSTOSCOPY/URETEROSCOPY/HOLMIUM LASER/STENT PLACEMENT Right 02/01/2022   Procedure: CYSTOSCOPY/URETEROSCOPY/HOLMIUM LASER/STENT PLACEMENT;  Surgeon: Billey Co, MD;  Location: [facility];  Service: Urology;  Laterality: Right;   HOLMIUM LASER APPLICATION Right [id number]   Procedure: HOLMIUM LASER APPLICATION;  Surgeon: Nickie Retort, MD;  Location: [facility];  Service: Urology;  Laterality: Right;   URETEROSCOPY WITH HOLMIUM LASER LITHOTRIPSY Right 02/14/2016   Procedure: URETEROSCOPY WITH HOLMIUM LASER LITHOTRIPSY;  Surgeon: Nickie Retort, MD;  Location: [facility];  Service: Urology;  Laterality: Right;   Social History   Occupational History   Not on file  Tobacco Use   Smoking status: Former    Types: Cigarettes    Quit date: 04/05/2020    Years since quitting: 2.7    Passive exposure: Past   Smokeless tobacco: Never   Tobacco comments:    june 2021  Vaping Use   Vaping Use: Every day   Substances: Nicotine   Devices: occa  Substance and Sexual Activity   Alcohol use: Yes    Alcohol/week: 0.0 standard drinks of alcohol    Comment: social   Drug use: No   Sexual activity: Not on file

## 2023-01-10 NOTE — Progress Notes (Unsigned)
Functional Pain Scale - descriptive words and definitions  {CHL AMB ORTHO FUNCTIONAL PAIN SCALE:28673}  Average Pain {NUMBERS; 0-10:5044}  Lower back and left hip pain. Hurts to sit. Pain radiates down left leg to calf. Throbbing pain. Standing pain=5/6 Sitting/Laying pain = 8 Advil every 4 hours. Helps a a little. Alternating heat and ice helps.

## 2023-01-14 ENCOUNTER — Ambulatory Visit: Payer: Self-pay | Admitting: Sports Medicine

## 2023-01-20 ENCOUNTER — Other Ambulatory Visit: Payer: Self-pay | Admitting: Psychiatry

## 2023-01-20 DIAGNOSIS — F3181 Bipolar II disorder: Secondary | ICD-10-CM

## 2023-01-21 ENCOUNTER — Other Ambulatory Visit: Payer: Self-pay | Admitting: Physical Medicine and Rehabilitation

## 2023-01-21 ENCOUNTER — Telehealth: Payer: Self-pay | Admitting: Physical Medicine and Rehabilitation

## 2023-01-21 DIAGNOSIS — M5416 Radiculopathy, lumbar region: Secondary | ICD-10-CM

## 2023-01-21 DIAGNOSIS — G8929 Other chronic pain: Secondary | ICD-10-CM

## 2023-01-21 DIAGNOSIS — M7918 Myalgia, other site: Secondary | ICD-10-CM

## 2023-01-21 NOTE — Progress Notes (Signed)
Spoke with patient, she is aware insurance denied claim for lumbar MRI imaging. I did place order for PT, we will revisit once PT is complete.

## 2023-01-21 NOTE — Telephone Encounter (Signed)
Peer to peer performed this morning with Dr. Bridgett Larsson from [facility]. Lumbar MRI imaging denied as she has not undergone recent 6 weeks of conservative care. I called and left patient voicemail to call us back. Would recommend starting PT.

## 2023-01-22 ENCOUNTER — Other Ambulatory Visit: Payer: 59

## 2023-01-28 ENCOUNTER — Encounter: Payer: Self-pay | Admitting: Psychiatry

## 2023-01-28 ENCOUNTER — Telehealth (INDEPENDENT_AMBULATORY_CARE_PROVIDER_SITE_OTHER): Payer: 59 | Admitting: Psychiatry

## 2023-01-28 DIAGNOSIS — Z634 Disappearance and death of family member: Secondary | ICD-10-CM | POA: Diagnosis not present

## 2023-01-28 DIAGNOSIS — G4701 Insomnia due to medical condition: Secondary | ICD-10-CM | POA: Diagnosis not present

## 2023-01-28 DIAGNOSIS — F3181 Bipolar II disorder: Secondary | ICD-10-CM

## 2023-01-28 DIAGNOSIS — F411 Generalized anxiety disorder: Secondary | ICD-10-CM | POA: Diagnosis not present

## 2023-01-28 MED ORDER — DULOXETINE HCL 20 MG PO CPEP: ORAL_CAPSULE | ORAL | 0 refills | Status: DC

## 2023-01-28 MED ORDER — L-METHYLFOLATE 7.5 MG PO TABS: 7.5000 mg | ORAL_TABLET | Freq: Every morning | ORAL | 3 refills | Status: DC

## 2023-01-28 MED ORDER — LAMOTRIGINE 25 MG PO TABS: 75.0000 mg | ORAL_TABLET | ORAL | 1 refills | Status: DC

## 2023-01-28 NOTE — Progress Notes (Unsigned)
Virtual Visit via Video Note  I connected with Molly Miller on 01/28/23 at 10:00 AM EDT by a video enabled telemedicine application and verified that I am speaking with the correct person using two identifiers.  Location Provider Location : Remote Office Patient Location : Work  Participants: Patient , Provider    I discussed the limitations of evaluation and management by telemedicine and the availability of in person appointments. The patient expressed understanding and agreed to proceed.   I discussed the assessment and treatment plan with the patient. The patient was provided an opportunity to ask questions and all were answered. The patient agreed with the plan and demonstrated an understanding of the instructions.   The patient was advised to call back or seek an in-person evaluation if the symptoms worsen or if the condition fails to improve as anticipated.   [facility] MD OP Progress Note  01/29/2023 8:06 AM Molly Miller  MRN:  [id number]  Chief Complaint:  Chief Complaint  Patient presents with   Follow-up   Medication Refill   Grief   HPI: Molly Miller is a 42 year old Caucasian female, divorced, currently lives with her boyfriend in [place], has a history of bipolar disorder type II, GAD was evaluated by telemedicine today.  Patient today reports she just lost her dad end of February.  He was struggling with dementia.  Patient is currently grieving the loss.  Does report sadness, low motivation, low energy, grief reaction the past few weeks.  Patient reports she is currently not in psychotherapy or grief counseling.  Agreeable to reach out to hospice.  Patient is currently compliant on her medications like Lamictal, Cymbalta.  Denies side effects.  Patient reports sleep as restless.  She reports whenever she takes the trazodone she is able to sleep through the night.  She however does not use it daily.  Encouraged to do so.  Agreeable to work on sleep  hygiene.  Patient denies any suicidality, homicidality or perceptual disturbances.  Patient denies any other concerns today.    Visit Diagnosis:    ICD-10-CM   1. Bipolar 2 disorder, major depressive episode (HCC)  F31.81 lamoTRIgine (LAMICTAL) 25 MG tablet    DULoxetine (CYMBALTA) 20 MG capsule   mild    2. GAD (generalized anxiety disorder)  F41.1 lamoTRIgine (LAMICTAL) 25 MG tablet    DULoxetine (CYMBALTA) 20 MG capsule    L-Methylfolate 7.5 MG TABS    3. Insomnia due to medical condition  G47.01    Anxiety    4. Bereavement  Z63.4       Past Psychiatric History: Reviewed past psychiatric history from progress note on 08/09/2020.  Past trials of venlafaxine.  Past Medical History:  Past Medical History:  Diagnosis Date   Acne    Anxiety    Depression    Kidney stone    Pre-diabetes     Past Surgical History:  Procedure Laterality Date   CYSTOSCOPY W/ RETROGRADES Right 02/01/2022   Procedure: CYSTOSCOPY WITH RETROGRADE PYELOGRAM;  Surgeon: Billey Co, MD;  Location: [facility];  Service: Urology;  Laterality: Right;   CYSTOSCOPY W/ URETERAL STENT PLACEMENT Right 02/14/2016   Procedure: CYSTOSCOPY WITH STENT EXCHANGE;  Surgeon: Nickie Retort, MD;  Location: [facility];  Service: Urology;  Laterality: Right;   CYSTOSCOPY WITH STENT PLACEMENT Right 01/14/2016   Procedure: RIGHT CYSTOSCOPY, RIGHT RETROGRADE, RIGHT URETERAL STENT PLACEMENT;  Surgeon: Ardis Hughs, MD;  Location: [facility];  Service: Urology;  Laterality: Right;  CYSTOSCOPY/URETEROSCOPY/HOLMIUM LASER/STENT PLACEMENT Right 01/16/2022   Procedure: CYSTOSCOPY/URETEROSCOPY/ RIGHT URETERAL STENT PLACEMENT;  Surgeon: Billey Co, MD;  Location: [facility];  Service: Urology;  Laterality: Right;   CYSTOSCOPY/URETEROSCOPY/HOLMIUM LASER/STENT PLACEMENT Right 02/01/2022   Procedure: CYSTOSCOPY/URETEROSCOPY/HOLMIUM LASER/STENT PLACEMENT;  Surgeon: Billey Co, MD;  Location: [facility];  Service: Urology;   Laterality: Right;   HOLMIUM LASER APPLICATION Right [id number]   Procedure: HOLMIUM LASER APPLICATION;  Surgeon: Nickie Retort, MD;  Location: [facility];  Service: Urology;  Laterality: Right;   URETEROSCOPY WITH HOLMIUM LASER LITHOTRIPSY Right 02/14/2016   Procedure: URETEROSCOPY WITH HOLMIUM LASER LITHOTRIPSY;  Surgeon: Nickie Retort, MD;  Location: [facility];  Service: Urology;  Laterality: Right;    Family Psychiatric History: Reviewed family psychiatric history from progress note on 08/09/2020.  Family History:  Family History  Problem Relation Age of Onset   Stroke Mother    Seizures Mother    Dementia Mother    Cancer Mother        Cervical?   Nephrolithiasis Mother    Hematuria Mother    Depression Mother    Hypertension Father    Arthritis Father    Heart disease Father    Depression Sister     Social History: Reviewed social history from progress note on 08/09/2020. Social History   Socioeconomic History   Marital status: Divorced    Spouse name: Not on file   Number of children: Not on file   Years of education: Not on file   Highest education level: Not on file  Occupational History   Not on file  Tobacco Use   Smoking status: Former    Types: Cigarettes    Quit date: 04/05/2020    Years since quitting: 2.8    Passive exposure: Past   Smokeless tobacco: Never   Tobacco comments:    june 2021  Vaping Use   Vaping Use: Every day   Substances: Nicotine   Devices: occa  Substance and Sexual Activity   Alcohol use: Yes    Alcohol/week: 0.0 standard drinks of alcohol    Comment: social   Drug use: No   Sexual activity: Not on file  Other Topics Concern   Not on file  Social History Narrative   Single   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      Social Determinants of Health   Financial Resource Strain: Not on file  Food Insecurity: Not on file  Transportation Needs: Not on file  Physical  Activity: Not on file  Stress: Not on file  Social Connections: Not on file    Allergies:  Allergies  Allergen Reactions   Tramadol Nausea Only    Metabolic Disorder Labs: Lab Results  Component Value Date   HGBA1C 5.9 10/19/2021   No results found for: "PROLACTIN" Lab Results  Component Value Date   CHOL 183 10/19/2021   TRIG 256.0 (H) 10/19/2021   HDL 35.90 (L) 10/19/2021   CHOLHDL 5 10/19/2021   VLDL 51.2 (H) 10/19/2021   Lab Results  Component Value Date   TSH 1.70 08/21/2020   TSH 1.520 06/02/2019    Therapeutic Level Labs: No results found for: "LITHIUM" No results found for: "VALPROATE" No results found for: "CBMZ"  Current Medications: Current Outpatient Medications  Medication Sig Dispense Refill   acetaminophen (TYLENOL) 500 MG tablet Take 500 mg by mouth every 6 (six) hours as needed.     cetirizine (ZYRTEC) 10  MG tablet Take 10 mg by mouth daily.     famotidine (PEPCID) 10 MG tablet Take 10 mg by mouth daily.     lamoTRIgine (LAMICTAL) 25 MG tablet Take 3 tablets (75 mg total) by mouth as directed. Take 1 tablet daily Morning and 2 tablets daily Evening 90 tablet 1   magnesium 30 MG tablet Take 30 mg by mouth 2 (two) times daily.     traZODone (DESYREL) 50 MG tablet Take 0.5-1 tablets (25-50 mg total) by mouth at bedtime as needed for sleep. 90 tablet 0   DULoxetine (CYMBALTA) 20 MG capsule TAKE 1 CAPSULE(20 MG) BY MOUTH TWICE DAILY 180 capsule 0   L-Methylfolate 7.5 MG TABS Take 1 tablet (7.5 mg total) by mouth every morning. 30 tablet 3   No current facility-administered medications for this visit.     Musculoskeletal: Strength & Muscle Tone:  UTA Gait & Station:  Seated Patient leans: N/A  Psychiatric Specialty Exam: Review of Systems  Psychiatric/Behavioral:  Positive for sleep disturbance.        Grieving  All other systems reviewed and are negative.   There were no vitals taken for this visit.There is no height or weight on file to  calculate BMI.  General Appearance: Casual  Eye Contact:  Fair  Speech:  Normal Rate  Volume:  Normal  Mood:   grief  Affect:  Appropriate  Thought Process:  Goal Directed and Descriptions of Associations: Intact  Orientation:  Full (Time, Place, and Person)  Thought Content: Logical   Suicidal Thoughts:  No  Homicidal Thoughts:  No  Memory:  Immediate;   Fair Recent;   Fair Remote;   Fair  Judgement:  Fair  Insight:  Fair  Psychomotor Activity:  Normal  Concentration:  Concentration: Fair and Attention Span: Fair  Recall:  [place] of Knowledge: Good  Language: Good  Akathisia:  No  Handed:  Right  AIMS (if indicated): not done  Assets:  Communication Skills Desire for Improvement Housing Social Support  ADL's:  Intact  Cognition: WNL  Sleep:   restless   Screenings: AIMS    Flowsheet Row Video Visit from 08/15/2022 in [facility] Total Score 0      GAD-7    Flowsheet Row Video Visit from 10/23/2022 in [facility] Video Visit from 08/15/2022 in [facility] Video Visit from 03/07/2022 in [facility] Video Visit from 01/09/2022 in [facility] Video Visit from 08/09/2020 in [facility]  Total GAD-7 Score 3 13 6 5 14       PHQ2-9    Flowsheet Row Video Visit from 10/23/2022 in [facility] Video Visit from 08/15/2022 in [facility] Video Visit from 03/07/2022 in [facility] Video Visit from 01/09/2022 in [facility] Video Visit from 10/09/2021 in [place]  PHQ-2 Total Score 1 2 1  0 2  PHQ-9 Total Score -- 11 4 -- 10       Flowsheet Row Video Visit from 01/28/2023 in [facility] Video Visit from 10/23/2022 in [facility] Video Visit from 08/15/2022 in [facility] No Risk No Risk No Risk  Assessment and Plan: Molly Miller is a 42 year old Caucasian female, employed, divorced, lives in [place], has a history of bipolar disorder, GAD was evaluated by telemedicine today.  Patient is currently grieving the loss of her father, does have depression symptoms, will benefit from the following plan.  Plan Bipolar disorder type II depressed-mild-unstable Increase lamotrigine to 75 mg p.o. daily in divided dosage. Cymbalta 20 mg p.o. twice daily  GAD-improving Cymbalta 20 mg p.o. twice daily Hydroxyzine 25-50 mg p.o. nightly as needed Deplin 7.5 mg p.o. daily for folic acid conversion reduction.  Insomnia-unstable Trazodone 25-50 mg p.o. nightly as needed.  Patient is not compliant on the trazodone.  Encouraged compliance. Continue sleep hygiene techniques  Bereavement-unstable Provided information for hospice.  Patient to start grief counseling.  Follow-up in clinic in 3 to 4 weeks or sooner if needed.  Collaboration of Care: Collaboration of Care: Other patient provided resources for grief counseling, patient encouraged to establish care.  Patient/Guardian was advised Release of Information must be obtained prior to any record release in order to collaborate their care with an outside provider. Patient/Guardian was advised if they have not already done so to contact the registration department to sign all necessary forms in order for [place] to release information regarding their care.   Consent: Patient/Guardian gives verbal consent for treatment and assignment of benefits for services provided during this visit. Patient/Guardian expressed understanding and  agreed to proceed.   This note was generated in part or whole with voice recognition software. Voice recognition is usually quite accurate but there are transcription errors that can and very often do occur. I apologize for any typographical errors that were not detected and corrected.    Ursula Alert, MD 01/29/2023, 8:06 AM

## 2023-02-05 ENCOUNTER — Ambulatory Visit: Payer: 59 | Admitting: Urology

## 2023-02-06 ENCOUNTER — Other Ambulatory Visit: Payer: Self-pay | Admitting: Urology

## 2023-02-06 ENCOUNTER — Encounter: Payer: Self-pay | Admitting: Urology

## 2023-02-06 ENCOUNTER — Ambulatory Visit
Admission: RE | Admit: 2023-02-06 | Discharge: 2023-02-06 | Disposition: A | Discharge status: Home / Self Care | Payer: 59 | Source: Ambulatory Visit | Attending: Urology | Admitting: Urology

## 2023-02-06 ENCOUNTER — Ambulatory Visit (INDEPENDENT_AMBULATORY_CARE_PROVIDER_SITE_OTHER): Payer: 59 | Admitting: Urology

## 2023-02-06 ENCOUNTER — Ambulatory Visit
Admission: RE | Admit: 2023-02-06 | Discharge: 2023-02-06 | Disposition: A | Discharge status: Home / Self Care | Payer: 59 | Attending: Urology | Admitting: Urology

## 2023-02-06 VITALS — BP 137/91 | HR 92 | Ht 69.0 in | Wt 280.0 lb

## 2023-02-06 DIAGNOSIS — N393 Stress incontinence (female) (male): Secondary | ICD-10-CM

## 2023-02-06 DIAGNOSIS — N200 Calculus of kidney: Secondary | ICD-10-CM

## 2023-02-06 DIAGNOSIS — Z87442 Personal history of urinary calculi: Secondary | ICD-10-CM | POA: Diagnosis not present

## 2023-02-06 NOTE — Patient Instructions (Signed)
Kegel Exercises  Kegel exercises can help strengthen your pelvic floor muscles. The pelvic floor is a group of muscles that support your rectum, small intestine, and bladder. In females, pelvic floor muscles also help support the uterus. These muscles help you control the flow of urine and stool (feces). Kegel exercises are painless and simple. They do not require any equipment. Your provider may suggest Kegel exercises to: Improve bladder and bowel control. Improve sexual response. Improve weak pelvic floor muscles after surgery to remove the uterus (hysterectomy) or after pregnancy, in females. Improve weak pelvic floor muscles after prostate gland removal or surgery, in males. Kegel exercises involve squeezing your pelvic floor muscles. These are the same muscles you squeeze when you try to stop the flow of urine or keep from passing gas. The exercises can be done while sitting, standing, or lying down, but it is best to vary your position. Ask your health care provider which exercises are safe for you. Do exercises exactly as told by your health care provider and adjust them as directed. Do not begin these exercises until told by your health care provider. Exercises How to do Kegel exercises: Squeeze your pelvic floor muscles tight. You should feel a tight lift in your rectal area. If you are a female, you should also feel a tightness in your vaginal area. Keep your stomach, buttocks, and legs relaxed. Hold the muscles tight for up to 10 seconds. Breathe normally. Relax your muscles for up to 10 seconds. Repeat as told by your health care provider. Repeat this exercise daily as told by your health care provider. Continue to do this exercise for at least 4-6 weeks, or for as long as told by your health care provider. You may be referred to a physical therapist who can help you learn more about how to do Kegel exercises. Depending on your condition, your health care provider may  recommend: Varying how long you squeeze your muscles. Doing several sets of exercises every day. Doing exercises for several weeks. Making Kegel exercises a part of your regular exercise routine. This information is not intended to replace advice given to you by your health care provider. Make sure you discuss any questions you have with your health care provider. Document Revised: 03/01/2021 Document Reviewed: 03/01/2021 Elsevier Patient Education  2023 [employer].  

## 2023-02-06 NOTE — Progress Notes (Signed)
   02/06/2023 12:50 PM   Idalia Clasen Weide May 31, 1981 [id number]  Reason for visit: Follow up nephrolithiasis, stress incontinence  HPI: 42 year old female who underwent right ureteroscopy and laser lithotripsy in March 2023 for large right-sided stone burden including a 1.5 cm upper pole stone.  Stent was removed 1 week postop and she has been doing very well since that time.  She denies any recurrent episodes of kidney stones, gross hematuria, or dysuria.  I personally viewed and interpreted her KUB today that shows no evidence of recurrent radiopaque stones.  We discussed general stone prevention strategies including adequate hydration with goal of producing 2.5 L of urine daily, increasing citric acid intake, increasing calcium intake during high oxalate meals, minimizing animal protein, and decreasing salt intake. Information about dietary recommendations given today.   She also reports some mild to moderate stress incontinence with coughing or sneezing.  We discussed strategies including Kegel exercises, pelvic floor physical therapy, pessary, or surgical sling procedures.  She would like to start with Kegel exercises but may be interested in referral to pelvic floor PT in the future.  Follow-up with urology as needed, return precautions were discussed   [place], [place] [address] [place], [place] [address] [phone]

## 2023-02-13 ENCOUNTER — Ambulatory Visit: Payer: 59 | Admitting: Urology

## 2023-02-14 ENCOUNTER — Ambulatory Visit: Payer: 59 | Admitting: Primary Care

## 2023-02-14 ENCOUNTER — Encounter: Payer: Self-pay | Admitting: Primary Care

## 2023-02-14 VITALS — BP 128/86 | HR 100 | Temp 98.1°F | Ht 69.0 in | Wt 273.0 lb

## 2023-02-14 DIAGNOSIS — R600 Localized edema: Secondary | ICD-10-CM

## 2023-02-14 MED ORDER — FUROSEMIDE 20 MG PO TABS: ORAL_TABLET | ORAL | 0 refills | Status: DC

## 2023-02-14 NOTE — Progress Notes (Signed)
Subjective:    Patient ID: Molly Miller, female    DOB: 1981/03/13, 42 y.o.   MRN: [id number]  HPI  Molly Miller is a very pleasant 42 y.o. female with a history of bilateral lower extremity edema, class 3 obesity, acute left ankle pain, prediabetes who presents today to discuss foot/ankle swelling.  Chronic and intermittent lower extremity swelling for years. Previously on Lasix PRN with improvement.   Over the last few weeks she's noticed increased swelling proximally to the ankle, to the ankle, and to the mid dorsal foot. Also with moderate pitting. She sits for most of her day, but over the last 6-8 months she's purchased a stand up desk, tries to stand for about 30 minutes every 2 hours.   She is not exercising due to her chronic left hip and back pain. She is following with orthopedics. Is doing some PT stretching to her lower back and left hip.   She has been eliminating processed foods and salts. She has been wearing compression socks in the evening.   Wt Readings from Last 3 Encounters:  02/14/23 273 lb (123.8 kg)  02/06/23 280 lb (127 kg)  05/27/22 280 lb (127 kg)   BP Readings from Last 3 Encounters:  02/14/23 128/86  02/06/23 (!) 137/91  05/27/22 128/78      Review of Systems  Respiratory:  Negative for shortness of breath.   Cardiovascular:  Positive for leg swelling.  Skin:  Negative for color change.         Past Medical History:  Diagnosis Date   Acne    Anxiety    Depression    Kidney stone    Pre-diabetes     Social History   Socioeconomic History   Marital status: Divorced    Spouse name: Not on file   Number of children: Not on file   Years of education: Not on file   Highest education level: Not on file  Occupational History   Not on file  Tobacco Use   Smoking status: Former    Types: Cigarettes    Quit date: 04/05/2020    Years since quitting: 2.8    Passive exposure: Past   Smokeless tobacco: Never   Tobacco comments:     june 2021  Vaping Use   Vaping Use: Every day   Substances: Nicotine   Devices: occa  Substance and Sexual Activity   Alcohol use: Yes    Alcohol/week: 0.0 standard drinks of alcohol    Comment: social   Drug use: No   Sexual activity: Not on file  Other Topics Concern   Not on file  Social History Narrative   Single   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      Social Determinants of Health   Financial Resource Strain: Not on file  Food Insecurity: Not on file  Transportation Needs: Not on file  Physical Activity: Not on file  Stress: Not on file  Social Connections: Not on file  Intimate Partner Violence: Not on file    Past Surgical History:  Procedure Laterality Date   CYSTOSCOPY W/ RETROGRADES Right 02/01/2022   Procedure: CYSTOSCOPY WITH RETROGRADE PYELOGRAM;  Surgeon: Sondra Come, MD;  Location: [facility];  Service: Urology;  Laterality: Right;   CYSTOSCOPY W/ URETERAL STENT PLACEMENT Right 02/14/2016   Procedure: CYSTOSCOPY WITH STENT EXCHANGE;  Surgeon: Hildred Laser, MD;  Location: [facility];  Service: Urology;  Laterality: Right;   CYSTOSCOPY WITH STENT PLACEMENT Right 01/14/2016   Procedure: RIGHT CYSTOSCOPY, RIGHT RETROGRADE, RIGHT URETERAL STENT PLACEMENT;  Surgeon: Crist Fat, MD;  Location: [facility];  Service: Urology;  Laterality: Right;   CYSTOSCOPY/URETEROSCOPY/HOLMIUM LASER/STENT PLACEMENT Right 01/16/2022   Procedure: CYSTOSCOPY/URETEROSCOPY/ RIGHT URETERAL STENT PLACEMENT;  Surgeon: Sondra Come, MD;  Location: [facility];  Service: Urology;  Laterality: Right;   CYSTOSCOPY/URETEROSCOPY/HOLMIUM LASER/STENT PLACEMENT Right 02/01/2022   Procedure: CYSTOSCOPY/URETEROSCOPY/HOLMIUM LASER/STENT PLACEMENT;  Surgeon: Sondra Come, MD;  Location: [facility];  Service: Urology;  Laterality: Right;   HOLMIUM LASER APPLICATION Right 02/14/2016   Procedure: HOLMIUM LASER APPLICATION;  Surgeon: Hildred Laser, MD;  Location: [facility];  Service: Urology;  Laterality: Right;   URETEROSCOPY WITH HOLMIUM LASER LITHOTRIPSY Right 02/14/2016   Procedure: URETEROSCOPY WITH HOLMIUM LASER LITHOTRIPSY;  Surgeon: Hildred Laser, MD;  Location: [facility];  Service: Urology;  Laterality: Right;    Family History  Problem Relation Age of Onset   Stroke Mother    Seizures Mother    Dementia Mother    Cancer Mother        Cervical?   Nephrolithiasis Mother    Hematuria Mother    Depression Mother    Hypertension Father    Arthritis Father    Heart disease Father    Depression Sister     Allergies  Allergen Reactions   Tramadol Nausea Only    Current Outpatient Medications on File Prior to Visit  Medication Sig Dispense Refill   acetaminophen (TYLENOL) 500 MG tablet Take 500 mg by mouth every 6 (six) hours as needed.     cetirizine (ZYRTEC) 10 MG tablet Take 10 mg by mouth daily.     DULoxetine (CYMBALTA) 20 MG capsule TAKE 1 CAPSULE(20 MG) BY MOUTH TWICE DAILY 180 capsule 0   famotidine (PEPCID) 10 MG tablet Take 10 mg by mouth daily.     L-Methylfolate 7.5 MG TABS Take 1 tablet (7.5 mg total) by mouth every morning. 30 tablet 3   lamoTRIgine (LAMICTAL) 25 MG tablet Take 3 tablets (75 mg total) by mouth as directed. Take 1 tablet daily Morning and 2 tablets daily Evening 90 tablet 1   magnesium 30 MG tablet Take 30 mg by mouth 2 (two) times daily.     traZODone (DESYREL) 50 MG tablet Take 0.5-1 tablets (25-50 mg total) by mouth at bedtime as needed for sleep. 90 tablet 0   No current facility-administered medications on file prior to visit.    BP 128/86   Pulse 100   Temp 98.1 F (36.7 C) (Temporal)   Ht  (1.753 m)   Wt 273 lb (123.8 kg)   LMP 02/03/2023 (Exact Date)   SpO2 98%   BMI 40.32 kg/m  Objective:   Physical Exam Cardiovascular:     Rate and Rhythm: Normal rate and regular rhythm.     Comments: Mild left ankle and dorsal pedal edema. No pitting. Pulmonary:      Effort: Pulmonary effort is normal.  Skin:    General: Skin is warm.  Psychiatric:        Mood and Affect: Mood normal.           Assessment & Plan:  Edema of left lower extremity Assessment & Plan: Chronic and intermittent. Reviewed labs from 2023.  Commended her on elevating her legs and standing. Encourage to start some physical activity. Low suspicion for DVT.  Referral placed for vascular services. Rx for  furosemide 20 mg daily x 3 days PRN sent to pharmacy. She will update.   Orders: -     Ambulatory referral to Vascular Surgery -     Furosemide; Take 1 tablet by mouth daily for 3 days as needed for swelling.  Dispense: 30 tablet; Refill: 0        [name], NP

## 2023-02-14 NOTE — Assessment & Plan Note (Signed)
Chronic and intermittent. Reviewed labs from 2023.  Commended her on elevating her legs and standing. Encourage to start some physical activity. Low suspicion for DVT.  Referral placed for vascular services. Rx for furosemide 20 mg daily x 3 days PRN sent to pharmacy. She will update.

## 2023-02-14 NOTE — Patient Instructions (Signed)
You will either be contacted via phone regarding your referral to vascular services, or you may receive a letter on your MyChart portal from our referral team with instructions for scheduling an appointment. Please let us know if you have not been contacted by anyone within two weeks.  You can take furosemide 20 mg daily for 3 days in a row as needed for lower leg swelling.  Try to walk everyday.  It was a pleasure to see you today!

## 2023-02-25 ENCOUNTER — Encounter: Payer: Self-pay | Admitting: Psychiatry

## 2023-02-25 ENCOUNTER — Telehealth (INDEPENDENT_AMBULATORY_CARE_PROVIDER_SITE_OTHER): Payer: 59 | Admitting: Psychiatry

## 2023-02-25 DIAGNOSIS — F3181 Bipolar II disorder: Secondary | ICD-10-CM | POA: Diagnosis not present

## 2023-02-25 DIAGNOSIS — F411 Generalized anxiety disorder: Secondary | ICD-10-CM

## 2023-02-25 DIAGNOSIS — G4701 Insomnia due to medical condition: Secondary | ICD-10-CM

## 2023-02-25 DIAGNOSIS — Z634 Disappearance and death of family member: Secondary | ICD-10-CM

## 2023-02-25 NOTE — Progress Notes (Unsigned)
Virtual Visit via Video Note  I connected with Molly Miller on 02/25/23 at 11:30 AM EDT by a video enabled telemedicine application and verified that I am speaking with the correct person using two identifiers.  Location Provider Location : [facility] Patient Location : Car  Participants: Patient , Friend ,Provider   I discussed the limitations of evaluation and management by telemedicine and the availability of in person appointments. The patient expressed understanding and agreed to proceed.   I discussed the assessment and treatment plan with the patient. The patient was provided an opportunity to ask questions and all were answered. The patient agreed with the plan and demonstrated an understanding of the instructions.   The patient was advised to call back or seek an in-person evaluation if the symptoms worsen or if the condition fails to improve as anticipated.  BH MD OP Progress Note  02/25/2023 12:10 PM Molly Miller  MRN:  [id number]  Chief Complaint:  Chief Complaint  Patient presents with   Follow-up   Anxiety   Medication Refill   Depression   HPI: Molly Miller is a 42 year old Caucasian female, divorced, currently lives with her boyfriend in [place], has a history of bipolar 2 disorder, GAD, insomnia, bereavement was evaluated by telemedicine today.  Patient today reports she is currently struggling with physical problems of leg swelling, she was prescribed Lasix recently which she has been taking.  She however has noticed headaches since the past several days.  She was not sure what medication could be contributing to that.  She has not had this problem before and wonders whether the higher dosage of Lamictal could be the reason.  Patient also reports nightmares, reports these are vivid and has been going on since the past week or more.  She does report multiple situational stressors at home.  Reports her stepdaughter is currently living with her and her boyfriend  and is currently pregnant.  That does cause some anxiety.  Patient reports otherwise she does not have any suicidality, homicidality or perceptual disturbances.  Patient reports work is going okay.  She does have concentration issues however she has been managing.  She is coping with her grief okay.  She does have good days and bad days.  Patient is compliant on medications as prescribed.  Patient currently does not have a therapist, motivated to start therapy.  Denies any other concerns today.  Visit Diagnosis:    ICD-10-CM   1. Bipolar 2 disorder, major depressive episode  F31.81    Mild-Improving    2. GAD (generalized anxiety disorder)  F41.1     3. Insomnia due to medical condition  G47.01    Anxiety    4. Bereavement  Z63.4       Past Psychiatric History: I have reviewed past psychiatric history from progress note on 08/09/2020.  Past trials of venlafaxine.  Past Medical History:  Past Medical History:  Diagnosis Date   Acne    Anxiety    Depression    Kidney stone    Pre-diabetes     Past Surgical History:  Procedure Laterality Date   CYSTOSCOPY W/ RETROGRADES Right 02/01/2022   Procedure: CYSTOSCOPY WITH RETROGRADE PYELOGRAM;  Surgeon: Sondra Come, MD;  Location: [facility];  Service: Urology;  Laterality: Right;   CYSTOSCOPY W/ URETERAL STENT PLACEMENT Right 02/14/2016   Procedure: CYSTOSCOPY WITH STENT EXCHANGE;  Surgeon: Hildred Laser, MD;  Location: [facility];  Service: Urology;  Laterality: Right;   CYSTOSCOPY  WITH STENT PLACEMENT Right 01/14/2016   Procedure: RIGHT CYSTOSCOPY, RIGHT RETROGRADE, RIGHT URETERAL STENT PLACEMENT;  Surgeon: Crist Fat, MD;  Location: [facility];  Service: Urology;  Laterality: Right;   CYSTOSCOPY/URETEROSCOPY/HOLMIUM LASER/STENT PLACEMENT Right 01/16/2022   Procedure: CYSTOSCOPY/URETEROSCOPY/ RIGHT URETERAL STENT PLACEMENT;  Surgeon: Sondra Come, MD;  Location: [facility];  Service: Urology;  Laterality: Right;    CYSTOSCOPY/URETEROSCOPY/HOLMIUM LASER/STENT PLACEMENT Right 02/01/2022   Procedure: CYSTOSCOPY/URETEROSCOPY/HOLMIUM LASER/STENT PLACEMENT;  Surgeon: Sondra Come, MD;  Location: [facility];  Service: Urology;  Laterality: Right;   HOLMIUM LASER APPLICATION Right 02/14/2016   Procedure: HOLMIUM LASER APPLICATION;  Surgeon: Hildred Laser, MD;  Location: [facility];  Service: Urology;  Laterality: Right;   URETEROSCOPY WITH HOLMIUM LASER LITHOTRIPSY Right 02/14/2016   Procedure: URETEROSCOPY WITH HOLMIUM LASER LITHOTRIPSY;  Surgeon: Hildred Laser, MD;  Location: [facility];  Service: Urology;  Laterality: Right;    Family Psychiatric History: I have reviewed family psychiatric history from progress note on 08/09/2020.  Family History:  Family History  Problem Relation Age of Onset   Stroke Mother    Seizures Mother    Dementia Mother    Cancer Mother        Cervical?   Nephrolithiasis Mother    Hematuria Mother    Depression Mother    Hypertension Father    Arthritis Father    Heart disease Father    Depression Sister     Social History: I have reviewed social history from progress note on 08/09/2020. Social History   Socioeconomic History   Marital status: Divorced    Spouse name: Not on file   Number of children: Not on file   Years of education: Not on file   Highest education level: Not on file  Occupational History   Not on file  Tobacco Use   Smoking status: Former    Types: Cigarettes    Quit date: 04/05/2020    Years since quitting: 2.8    Passive exposure: Past   Smokeless tobacco: Never   Tobacco comments:    june 2021  Vaping Use   Vaping Use: Every day   Substances: Nicotine   Devices: occa  Substance and Sexual Activity   Alcohol use: Yes    Alcohol/week: 0.0 standard drinks of alcohol    Comment: social   Drug use: No   Sexual activity: Not on file  Other Topics Concern   Not on file  Social History Narrative   Single   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      Social Determinants of Health   Financial Resource Strain: Not on file  Food Insecurity: Not on file  Transportation Needs: Not on file  Physical Activity: Not on file  Stress: Not on file  Social Connections: Not on file    Allergies:  Allergies  Allergen Reactions   Tramadol Nausea Only    Metabolic Disorder Labs: Lab Results  Component Value Date   HGBA1C 5.9 10/19/2021   No results found for: "PROLACTIN" Lab Results  Component Value Date   CHOL 183 10/19/2021   TRIG 256.0 (H) 10/19/2021   HDL 35.90 (L) 10/19/2021   CHOLHDL 5 10/19/2021   VLDL 51.2 (H) 10/19/2021   Lab Results  Component Value Date   TSH 1.70 08/21/2020   TSH 1.520 06/02/2019    Therapeutic Level Labs: No results found for: "LITHIUM" No results found for: "VALPROATE" No results found  for: "CBMZ"  Current Medications: Current Outpatient Medications  Medication Sig Dispense Refill   acetaminophen (TYLENOL) 500 MG tablet Take 500 mg by mouth every 6 (six) hours as needed.     cetirizine (ZYRTEC) 10 MG tablet Take 10 mg by mouth daily.     DULoxetine (CYMBALTA) 20 MG capsule TAKE 1 CAPSULE(20 MG) BY MOUTH TWICE DAILY 180 capsule 0   famotidine (PEPCID) 10 MG tablet Take 10 mg by mouth daily.     furosemide (LASIX) 20 MG tablet Take 1 tablet by mouth daily for 3 days as needed for swelling. 30 tablet 0   L-Methylfolate 7.5 MG TABS Take 1 tablet (7.5 mg total) by mouth every morning. 30 tablet 3   lamoTRIgine (LAMICTAL) 25 MG tablet Take 3 tablets (75 mg total) by mouth as directed. Take 1 tablet daily Morning and 2 tablets daily Evening 90 tablet 1   magnesium 30 MG tablet Take 30 mg by mouth 2 (two) times daily.     traZODone (DESYREL) 50 MG tablet Take 0.5-1 tablets (25-50 mg total) by mouth at bedtime as needed for sleep. (Patient not taking: Reported on 02/25/2023) 90 tablet 0   No current facility-administered medications  for this visit.     Musculoskeletal: Strength & Muscle Tone:  UTA Gait & Station:  Seated Patient leans: N/A  Psychiatric Specialty Exam: Review of Systems  Cardiovascular:  Positive for leg swelling.  Psychiatric/Behavioral:  Positive for decreased concentration and sleep disturbance. The patient is nervous/anxious.   All other systems reviewed and are negative.   Last menstrual period 02/03/2023.There is no height or weight on file to calculate BMI.  General Appearance: Casual  Eye Contact:  Fair  Speech:  Clear and Coherent  Volume:  Normal  Mood:  Anxious  Affect:  Congruent  Thought Process:  Goal Directed and Descriptions of Associations: Intact  Orientation:  Full (Time, Place, and Person)  Thought Content: Logical   Suicidal Thoughts:  No  Homicidal Thoughts:  No  Memory:  Immediate;   Fair Recent;   Fair Remote;   Fair  Judgement:  Fair  Insight:  Fair  Psychomotor Activity:  Normal  Concentration:  Concentration: Fair and Attention Span: Fair  Recall:  [employer] of Knowledge: Fair  Language: Fair  Akathisia:  No  Handed:  Right  AIMS (if indicated): not done  Assets:  Communication Skills Desire for Improvement Housing Social Support  ADL's:  Intact  Cognition: WNL  Sleep:   restless due to nightmares   Screenings: AIMS    Flowsheet Row Video Visit from 08/15/2022 in [facility]  AIMS Total Score 0      GAD-7    Flowsheet Row Video Visit from 10/23/2022 in [facility] Video Visit from 08/15/2022 in [facility] Video Visit from 03/07/2022 in [facility] Video Visit from 01/09/2022 in [facility] Video Visit from 08/09/2020 in [facility]  Total GAD-7 Score PHQ2-9    Flowsheet Row Office  Visit from 02/14/2023 in [facility] Video Visit from 10/23/2022 in [facility] Video Visit from 08/15/2022 in [facility] Video Visit from 03/07/2022 in [facility] Video Visit from 01/09/2022 in [facility]  PHQ-2 Total Score 2 1 2 1  0  PHQ-9 Total Score 9 -- 11 4 --      Flowsheet Row Video Visit from 02/25/2023 in [facility] Video Visit from 01/28/2023 in [facility] Video Visit from 10/23/2022 in [facility]  C-SSRS RISK CATEGORY No Risk No Risk No Risk        Assessment and Plan: Molly Miller is a 42 year old Caucasian female, employed, divorced, lives in [place], has a history of bipolar disorder, GAD was evaluated by telemedicine today.  Patient with multiple psychosocial stressors continues to have anxiety, as well as recent nightmares, possible side effects to medications, unknown which one causing headaches, will benefit from the following plan.  Plan Bipolar disorder type II depressed mild-some improvement Lamotrigine 75 mg p.o. daily in divided dosage.  Will not make any more readjustment at this time since she is currently having headaches. Cymbalta 20 mg p.o. twice daily  GAD-unstable Will refer for psychotherapy-patient provided community resources. Cymbalta 20 mg p.o. twice daily Hydroxyzine 25-50 mg p.o. nightly as needed Deplin 7.5 mg p.o. daily for folic acid conversion reduction.  Insomnia-unstable due to nightmares Unknown what could be contributing to recent nightmares.  Provided information about trazodone likely contributing to nightmares.  Patient however is currently not interested in any medication changes. Patient also with situational  stressors which likely contributing to nightmares, hence referred for CBT Patient to work on sleep hygiene techniques She also has hydroxyzine which she can take at night as needed if she has sleep issues. Patient to keep a log of her sleep.  Bereavement-improving Patient provided information for psychotherapist in the community.  Patient recently started on furosemide which likely also contributing to headaches, patient advised to follow up with primary care provider regarding her concern as well.  Follow-up in clinic in 2 months or sooner if needed.   Collaboration of Care: Collaboration of Care: Referral or follow-up with counselor/therapist AEB encouraged to establish care with therapist.  Provided resources.  I have reviewed notes per Ms. Natalia Leatherwood Clark-dated 02/14/2023-patient with history of edema of left lower extremity-recommended elevating her legs.  Patient started on furosemide 20 mg x 3 days as needed.  Patient/Guardian was advised Release of Information must be obtained prior to any record release in order to collaborate their care with an outside provider. Patient/Guardian was advised if they have not already done so to contact the registration department to sign all necessary forms in order for [place] to release information regarding their care.   Consent: Patient/Guardian gives verbal consent for treatment and assignment of benefits for services provided during this visit. Patient/Guardian expressed understanding and agreed to proceed.   This note was generated in part or whole with voice recognition software. Voice recognition is usually quite accurate but there are transcription errors that can and very often do occur. I apologize for any typographical errors that were not detected and corrected.    Jomarie Longs, MD 02/27/2023, 9:41 AM

## 2023-02-27 NOTE — Patient Instructions (Signed)
  www.[email or web address].org  www.psychologytoday  [facility] [email or web address] [address], [place], [place] [address]  [phone]  [facility] [email or web address] [address], [place], [place] [address]   [phone]   [facility] - [phone]  [facility] - [phone]  [facility] - [phone]   [facility] - [phone]  [facility] - [phone]   [email or web address] this clinician can offer telehealth and has a sliding scale option  https://www.[facility].org/ this group also offers sliding scale rates and is based out of [place]  Dr. Keisha Rogers with the [facility] specializes in divorce  [facility] has interns who offer sliding scale rates and some of the full time clinicians do, as well. You complete their contact form on their website and the referrals coordinator will help to get connected to someone   

## 2023-03-11 ENCOUNTER — Other Ambulatory Visit: Payer: Self-pay | Admitting: *Deleted

## 2023-03-11 DIAGNOSIS — R600 Localized edema: Secondary | ICD-10-CM

## 2023-03-18 ENCOUNTER — Encounter: Payer: 59 | Admitting: Vascular Surgery

## 2023-03-18 ENCOUNTER — Encounter (HOSPITAL_COMMUNITY): Payer: 59

## 2023-03-19 ENCOUNTER — Ambulatory Visit (HOSPITAL_COMMUNITY): Payer: 59 | Attending: Vascular Surgery

## 2023-03-20 ENCOUNTER — Encounter: Payer: Self-pay | Admitting: Primary Care

## 2023-03-20 ENCOUNTER — Telehealth (INDEPENDENT_AMBULATORY_CARE_PROVIDER_SITE_OTHER): Payer: 59 | Admitting: Primary Care

## 2023-03-20 DIAGNOSIS — U071 COVID-19: Secondary | ICD-10-CM

## 2023-03-20 HISTORY — DX: COVID-19: U07.1

## 2023-03-20 MED ORDER — NIRMATRELVIR/RITONAVIR (PAXLOVID)TABLET: 3.0000 | ORAL_TABLET | Freq: Two times a day (BID) | ORAL | 0 refills | Status: AC

## 2023-03-20 NOTE — Assessment & Plan Note (Signed)
Qualifies for antiviral treatment and is within the window. She appears safe for outpatient treatment.  Discussed treatment options, she opts for antiviral treatment.  GFR within normal range, no history of CKD. Rx for Paxlovid sent to pharmacy. Discussed instructions for use. Also discussed CDC guidelines for quarantine and masking. Work note provided.

## 2023-03-20 NOTE — Patient Instructions (Signed)
Start Paxlovid medication for Covid-19 infection. Take 3 tablets by mouth twice daily for 5 days.  Remain in quarantine until you start feeling a lot better as discussed. Wear a mask for 5 days after you feel better.  It was a pleasure to see you today!

## 2023-03-20 NOTE — Progress Notes (Signed)
Patient ID: Molly Miller, female    DOB: Aug 20, 1981, 42 y.o.   MRN: [id number]  Virtual visit completed through [employer], a video enabled telemedicine application. Due to national recommendations of social distancing due to COVID-19, a virtual visit is felt to be most appropriate for this patient at this time. Reviewed limitations, risks, security and privacy concerns of performing a virtual visit and the availability of in person appointments. I also reviewed that there may be a patient responsible charge related to this service. The patient agreed to proceed.   Patient location: home Provider location: [place] at [facility], office Persons participating in this virtual visit: patient, provider   If any vitals were documented, they were collected by patient at home unless specified below.    There were no vitals taken for this visit.   CC: Covid-19 positive Subjective:   HPI: Molly Miller is a 42 y.o. female with a history of class 3 obesity, prediabetes, seasonal allergies, elevated blood pressure readings presenting on 03/20/2023 for Covid Positive (Symptoms started Monday night /Head congestion, sinus drainage, body aches, chills,  productive cough (feels deep in chest)/Declines fever/At home Covid test yesterday was positive. )   Symptom onset three days ago with nasal and head congestion. She then developed sinus pressure, body aches, chills, productive cough, fevers (100.3 t-max). She took an at home Covid-19 test yesterday which was positive.   Prior to symptom onset [place] the beach. She's been taking Tylenol Severe Congestion and another medication for flu-like symptoms. This morning she's feeling less achy and she's not had fevers since yesterday.       Relevant past medical, surgical, family and social history reviewed and updated as indicated. Interim medical history since our last visit reviewed. Allergies and medications reviewed and updated. Outpatient  Medications Prior to Visit  Medication Sig Dispense Refill   acetaminophen (TYLENOL) 500 MG tablet Take 500 mg by mouth every 6 (six) hours as needed.     cetirizine (ZYRTEC) 10 MG tablet Take 10 mg by mouth daily.     DULoxetine (CYMBALTA) 20 MG capsule TAKE 1 CAPSULE(20 MG) BY MOUTH TWICE DAILY 180 capsule 0   famotidine (PEPCID) 10 MG tablet Take 10 mg by mouth daily.     furosemide (LASIX) 20 MG tablet Take 1 tablet by mouth daily for 3 days as needed for swelling. 30 tablet 0   L-Methylfolate 7.5 MG TABS Take 1 tablet (7.5 mg total) by mouth every morning. 30 tablet 3   lamoTRIgine (LAMICTAL) 25 MG tablet Take 3 tablets (75 mg total) by mouth as directed. Take 1 tablet daily Morning and 2 tablets daily Evening 90 tablet 1   magnesium 30 MG tablet Take 30 mg by mouth 2 (two) times daily.     traZODone (DESYREL) 50 MG tablet Take 0.5-1 tablets (25-50 mg total) by mouth at bedtime as needed for sleep. (Patient not taking: Reported on 03/20/2023) 90 tablet 0   No facility-administered medications prior to visit.     Per HPI unless specifically indicated in ROS section below Review of Systems  Constitutional:  Positive for chills and fatigue. Negative for fever.  HENT:  Positive for congestion and rhinorrhea. Negative for sore throat.   Respiratory:  Positive for cough. Negative for shortness of breath.   Neurological:  Positive for headaches.   Objective:  There were no vitals taken for this visit.  Wt Readings from Last 3 Encounters:  02/14/23 273 lb (123.8 kg)  02/06/23 280 lb (127 kg)  05/27/22 280 lb (127 kg)       Physical exam: General: Alert and oriented x 3, no distress, does appear sickly  Pulmonary: Speaks in complete sentences without increased work of breathing. Congested cough noted several times during visit.   Psychiatric: Normal mood, thought content, and behavior.     Results for orders placed or performed in visit on 07/01/22  Cyclic citrul peptide antibody,  IgG  Result Value Ref Range   Cyclic Citrullin Peptide Ab <16 UNITS  Sedimentation rate  Result Value Ref Range   Sed Rate 13 0 - 20 mm/hr  C-reactive protein  Result Value Ref Range   CRP 1.2 0.5 - 20.0 mg/dL  Rheumatoid factor  Result Value Ref Range   Rheumatoid fact SerPl-aCnc <14 <14 IU/mL  Uric acid  Result Value Ref Range   Uric Acid, Serum 5.2 2.4 - 7.0 mg/dL   Assessment & Plan:   Problem List Items Addressed This Visit       Other   COVID-19 virus infection - Primary    Qualifies for antiviral treatment and is within the window. She appears safe for outpatient treatment.  Discussed treatment options, she opts for antiviral treatment.  GFR within normal range, no history of CKD. Rx for Paxlovid sent to pharmacy. Discussed instructions for use. Also discussed CDC guidelines for quarantine and masking. Work note provided.       Relevant Medications   nirmatrelvir/ritonavir (PAXLOVID) 20 x 150 MG & 10 x 100MG  TABS     Meds ordered this encounter  Medications   nirmatrelvir/ritonavir (PAXLOVID) 20 x 150 MG & 10 x 100MG  TABS    Sig: Take 3 tablets by mouth 2 (two) times daily for 5 days.    Dispense:  30 tablet    Refill:  0    Order Specific Question:   Supervising Provider    Answer:   BEDSOLE, AMY E [[id number]]   No orders of the defined types were placed in this encounter.   I discussed the assessment and treatment plan with the patient. The patient was provided an opportunity to ask questions and all were answered. The patient agreed with the plan and demonstrated an understanding of the instructions. The patient was advised to call back or seek an in-person evaluation if the symptoms worsen or if the condition fails to improve as anticipated.  Follow up plan:  Start Paxlovid medication for Covid-19 infection. Take 3 tablets by mouth twice daily for 5 days.  Remain in quarantine until you start feeling a lot better as discussed. Wear a mask for 5 days after  you feel better.  It was a pleasure to see you today!   [name], NP

## 2023-03-25 ENCOUNTER — Other Ambulatory Visit: Payer: Self-pay | Admitting: *Deleted

## 2023-03-25 ENCOUNTER — Encounter: Payer: 59 | Admitting: Vascular Surgery

## 2023-03-25 DIAGNOSIS — R600 Localized edema: Secondary | ICD-10-CM

## 2023-03-28 ENCOUNTER — Ambulatory Visit (HOSPITAL_COMMUNITY)
Admission: RE | Admit: 2023-03-28 | Discharge: 2023-03-28 | Disposition: A | Discharge status: Home / Self Care | Payer: 59 | Source: Ambulatory Visit | Attending: Vascular Surgery | Admitting: Vascular Surgery

## 2023-03-28 DIAGNOSIS — R600 Localized edema: Secondary | ICD-10-CM | POA: Insufficient documentation

## 2023-03-31 ENCOUNTER — Other Ambulatory Visit: Payer: Self-pay | Admitting: Psychiatry

## 2023-03-31 DIAGNOSIS — F411 Generalized anxiety disorder: Secondary | ICD-10-CM

## 2023-03-31 DIAGNOSIS — F3181 Bipolar II disorder: Secondary | ICD-10-CM

## 2023-04-08 ENCOUNTER — Encounter: Payer: Self-pay | Admitting: Vascular Surgery

## 2023-04-08 ENCOUNTER — Ambulatory Visit (INDEPENDENT_AMBULATORY_CARE_PROVIDER_SITE_OTHER): Payer: 59 | Admitting: Vascular Surgery

## 2023-04-08 VITALS — BP 132/89 | HR 88 | Temp 98.2°F | Resp 14 | Ht 69.0 in | Wt 274.0 lb

## 2023-04-08 DIAGNOSIS — I872 Venous insufficiency (chronic) (peripheral): Secondary | ICD-10-CM

## 2023-04-08 NOTE — Progress Notes (Signed)
Patient name: Molly Miller MRN: [id number] DOB: 20-Jul-1981 Sex: female  REASON FOR CONSULT: Edema left ankle  HPI: Molly Miller is a 42 y.o. female, with hx anxiety and depression that presents for evaluation of left leg swelling.  She has been dealing with this for the last 5 to 6 years.  No history of DVT.  She intermittently wears compression stockings.  She works mostly sedentary at [employer] job.  Does limited exercise with her sciatica.  No previous venous interventions.  Past Medical History:  Diagnosis Date   Acne    Anxiety    Depression    Kidney stone    Pre-diabetes     Past Surgical History:  Procedure Laterality Date   CYSTOSCOPY W/ RETROGRADES Right 02/01/2022   Procedure: CYSTOSCOPY WITH RETROGRADE PYELOGRAM;  Surgeon: Sondra Come, MD;  Location: [facility];  Service: Urology;  Laterality: Right;   CYSTOSCOPY W/ URETERAL STENT PLACEMENT Right 02/14/2016   Procedure: CYSTOSCOPY WITH STENT EXCHANGE;  Surgeon: Hildred Laser, MD;  Location: [facility];  Service: Urology;  Laterality: Right;   CYSTOSCOPY WITH STENT PLACEMENT Right 01/14/2016   Procedure: RIGHT CYSTOSCOPY, RIGHT RETROGRADE, RIGHT URETERAL STENT PLACEMENT;  Surgeon: Crist Fat, MD;  Location: [facility];  Service: Urology;  Laterality: Right;   CYSTOSCOPY/URETEROSCOPY/HOLMIUM LASER/STENT PLACEMENT Right 01/16/2022   Procedure: CYSTOSCOPY/URETEROSCOPY/ RIGHT URETERAL STENT PLACEMENT;  Surgeon: Sondra Come, MD;  Location: [facility];  Service: Urology;  Laterality: Right;   CYSTOSCOPY/URETEROSCOPY/HOLMIUM LASER/STENT PLACEMENT Right 02/01/2022   Procedure: CYSTOSCOPY/URETEROSCOPY/HOLMIUM LASER/STENT PLACEMENT;  Surgeon: Sondra Come, MD;  Location: [facility];  Service: Urology;  Laterality: Right;   HOLMIUM LASER APPLICATION Right 02/14/2016   Procedure: HOLMIUM LASER APPLICATION;  Surgeon: Hildred Laser, MD;  Location: [facility];  Service: Urology;  Laterality: Right;   URETEROSCOPY WITH  HOLMIUM LASER LITHOTRIPSY Right 02/14/2016   Procedure: URETEROSCOPY WITH HOLMIUM LASER LITHOTRIPSY;  Surgeon: Hildred Laser, MD;  Location: [facility];  Service: Urology;  Laterality: Right;    Family History  Problem Relation Age of Onset   Stroke Mother    Seizures Mother    Dementia Mother    Cancer Mother        Cervical?   Nephrolithiasis Mother    Hematuria Mother    Depression Mother    Hypertension Father    Arthritis Father    Heart disease Father    Depression Sister     SOCIAL HISTORY: Social History   Socioeconomic History   Marital status: Divorced    Spouse name: Not on file   Number of children: Not on file   Years of education: Not on file   Highest education level: Not on file  Occupational History   Not on file  Tobacco Use   Smoking status: Former    Types: Cigarettes    Quit date: 04/05/2020    Years since quitting: 3.0    Passive exposure: Past   Smokeless tobacco: Never   Tobacco comments:    june 2021  Vaping Use   Vaping Use: Every day   Substances: Nicotine   Devices: occa  Substance and Sexual Activity   Alcohol use: Yes    Alcohol/week: 0.0 standard drinks of alcohol    Comment: social   Drug use: No   Sexual activity: Not on file  Other Topics Concern   Not on file  Social History Narrative   Single   Works at [employer]  Enjoys shopping and antique and [employer], gardening.      Social Determinants of Health   Financial Resource Strain: Not on file  Food Insecurity: Not on file  Transportation Needs: Not on file  Physical Activity: Not on file  Stress: Not on file  Social Connections: Not on file  Intimate Partner Violence: Not on file    Allergies  Allergen Reactions   Tramadol Nausea Only    Current Outpatient Medications  Medication Sig Dispense Refill   acetaminophen (TYLENOL) 500 MG tablet Take 500 mg by mouth every 6 (six) hours as needed.     cetirizine (ZYRTEC) 10 MG tablet Take 10 mg by mouth  daily.     DULoxetine (CYMBALTA) 20 MG capsule TAKE 1 CAPSULE(20 MG) BY MOUTH TWICE DAILY 180 capsule 0   famotidine (PEPCID) 10 MG tablet Take 10 mg by mouth daily.     furosemide (LASIX) 20 MG tablet Take 1 tablet by mouth daily for 3 days as needed for swelling. 30 tablet 0   L-Methylfolate 7.5 MG TABS Take 1 tablet (7.5 mg total) by mouth every morning. 30 tablet 3   lamoTRIgine (LAMICTAL) 25 MG tablet Take 3 tablets (75 mg total) by mouth as directed. Take 1 tablet daily Morning and 2 tablets daily Evening 90 tablet 1   magnesium 30 MG tablet Take 30 mg by mouth 2 (two) times daily.     traZODone (DESYREL) 50 MG tablet Take 0.5-1 tablets (25-50 mg total) by mouth at bedtime as needed for sleep. (Patient not taking: Reported on 03/20/2023) 90 tablet 0   No current facility-administered medications for this visit.    REVIEW OF SYSTEMS:  [X]  denotes positive finding, [ ]  denotes negative finding Cardiac  Comments:  Chest pain or chest pressure:    Shortness of breath upon exertion:    Short of breath when lying flat:    Irregular heart rhythm:        Vascular    Pain in calf, thigh, or hip brought on by ambulation:    Pain in feet at night that wakes you up from your sleep:     Blood clot in your veins:    Leg swelling:  x Left       Pulmonary    Oxygen at home:    Productive cough:     Wheezing:         Neurologic    Sudden weakness in arms or legs:     Sudden numbness in arms or legs:     Sudden onset of difficulty speaking or slurred speech:    Temporary loss of vision in one eye:     Problems with dizziness:         Gastrointestinal    Blood in stool:     Vomited blood:         Genitourinary    Burning when urinating:     Blood in urine:        Psychiatric    Major depression:         Hematologic    Bleeding problems:    Problems with blood clotting too easily:        Skin    Rashes or ulcers:        Constitutional    Fever or chills:      PHYSICAL  EXAM: Vitals:   04/08/23 1004  BP: 132/89  Pulse: 88  Resp: 14  Temp: 98.2 F (36.8 C)  TempSrc: Temporal  SpO2: 97%  Weight: 274 lb (124.3 kg)  Height: 5\' 9"  (1.753 m)    GENERAL: The patient is a well-nourished female, in no acute distress. The vital signs are documented above. CARDIAC: There is a regular rate and rhythm.  VASCULAR:  Bilateral femoral pulses palpable Bilateral DP pulses palpable No lower extremity ulcerations PULMONARY: No respiratory distress. ABDOMEN: Soft and non-tender. MUSCULOSKELETAL: There are no major deformities or cyanosis. NEUROLOGIC: No focal weakness or paresthesias are detected. SKIN: There are no ulcers or rashes noted. PSYCHIATRIC: The patient has a normal affect.  DATA:    Lower Venous Reflux Study   Patient Name:  RASHAWN BRASIER  Date of Exam:   03/28/2023  Medical Rec #: [id number]         Accession #:    [id number]  Date of Birth: Jul 17, 1981          Patient Gender: F  Patient Age:   11 years  Exam Location:  [facility]  Procedure:      VAS [place] LOWER EXTREMITY VENOUS REFLUX  Referring Phys: Sherald Hess    ---------------------------------------------------------------------------  -----    Indications: Edema.    Performing Technologist: Elita Quick RVT     Examination Guidelines: A complete evaluation includes B-mode imaging,  spectral  Doppler, color Doppler, and power Doppler as needed of all accessible  portions  of each vessel. Bilateral testing is considered an integral part of a  complete  examination. Limited examinations for reoccurring indications may be  performed  as noted. The reflux portion of the exam is performed with the patient in  reverse Trendelenburg.  Significant venous reflux is defined as >500 ms in the superficial venous  system, and >1 second in the deep venous system.     +--------------+---------+------+-----------+------------+--------+  LEFT         Reflux  NoRefluxReflux TimeDiameter cmsComments                          Yes                                   +--------------+---------+------+-----------+------------+--------+  CFV                    yes   >1 second                       +--------------+---------+------+-----------+------------+--------+  FV mid        no                                              +--------------+---------+------+-----------+------------+--------+  Popliteal    no                                              +--------------+---------+------+-----------+------------+--------+  GSV at SFJ              yes    >500 ms      0.71              +--------------+---------+------+-----------+------------+--------+  GSV prox thigh          yes    >  500 ms      0.90              +--------------+---------+------+-----------+------------+--------+  GSV mid thigh no                            0.67              +--------------+---------+------+-----------+------------+--------+  GSV dist thighno                            0.64              +--------------+---------+------+-----------+------------+--------+  GSV at knee   no                            0.60              +--------------+---------+------+-----------+------------+--------+  GSV prox calf           yes    >500 ms      0.42              +--------------+---------+------+-----------+------------+--------+  GSV mid calf  no                            0.39              +--------------+---------+------+-----------+------------+--------+  SSV Pop Fossa           yes    >500 ms      0.54              +--------------+---------+------+-----------+------------+--------+  SSV prox calf no                            0.33              +--------------+---------+------+-----------+------------+--------+  SSV mid calf            yes    >500 ms      0.33               +--------------+---------+------+-----------+------------+--------+  AASV O        no                            0.19              +--------------+---------+------+-----------+------------+--------+  AASV P                                              NV        +--------------+---------+------+-----------+------------+--------+         Summary:  Left:  - No evidence of deep vein thrombosis seen in the left lower extremity,  from the common femoral through the popliteal veins.  - Venous reflux is noted in the left common femoral vein.  - Venous reflux is noted in the left sapheno-femoral junction.  - Venous reflux is noted in the left greater saphenous vein in the thigh.  - Venous reflux is noted in the left greater saphenous vein in the calf.  - Venous reflux is noted in the left short saphenous vein.  - No venous reflux is noted in the left  AASV.    *See table(s) above for measurements and observations.   Electronically signed by Gerarda Fraction on 03/28/2023 at 5:13:18 PM.      Assessment/Plan:  42 year old female presents for evaluation of chronic left lower extremity edema.  I discussed that her reflux study confirms she has chronic venous insufficiency as the likely underlying etiology.  She has reflux in both the superficial and deep venous system in her left leg.  I discussed no evidence of DVT.  Her superficial reflux in the GSV is not long-segment and has intermittent focal areas so I do not think she is a candidate for laser ablation at this time.  I discussed leg elevation with exercise weight loss and compression stockings.  We did get her sized for medical grade compression stockings today.  I discussed she wear these on a daily basis.  I discussed she increase her activity level.  She can follow-up with [place] up in the future if she does not feel like this controls her symptoms.   Cephus Shelling, MD [facility] Office:  [phone]

## 2023-04-16 ENCOUNTER — Other Ambulatory Visit: Payer: Self-pay | Admitting: Psychiatry

## 2023-04-16 DIAGNOSIS — F3181 Bipolar II disorder: Secondary | ICD-10-CM

## 2023-04-16 DIAGNOSIS — F411 Generalized anxiety disorder: Secondary | ICD-10-CM

## 2023-04-29 ENCOUNTER — Telehealth: Payer: 59 | Admitting: Psychiatry

## 2023-05-03 ENCOUNTER — Other Ambulatory Visit: Payer: Self-pay | Admitting: Psychiatry

## 2023-05-03 DIAGNOSIS — F411 Generalized anxiety disorder: Secondary | ICD-10-CM

## 2023-05-03 DIAGNOSIS — F3181 Bipolar II disorder: Secondary | ICD-10-CM

## 2023-06-05 ENCOUNTER — Encounter: Payer: Self-pay | Admitting: Psychiatry

## 2023-06-05 ENCOUNTER — Telehealth (INDEPENDENT_AMBULATORY_CARE_PROVIDER_SITE_OTHER): Payer: 59 | Admitting: Psychiatry

## 2023-06-05 DIAGNOSIS — G4701 Insomnia due to medical condition: Secondary | ICD-10-CM | POA: Diagnosis not present

## 2023-06-05 DIAGNOSIS — Z634 Disappearance and death of family member: Secondary | ICD-10-CM

## 2023-06-05 DIAGNOSIS — F3176 Bipolar disorder, in full remission, most recent episode depressed: Secondary | ICD-10-CM

## 2023-06-05 DIAGNOSIS — F411 Generalized anxiety disorder: Secondary | ICD-10-CM | POA: Diagnosis not present

## 2023-06-05 MED ORDER — L-METHYLFOLATE 7.5 MG PO TABS
7.5000 mg | ORAL_TABLET | Freq: Every morning | ORAL | 11 refills | Status: AC
Start: 2023-06-05 — End: ?

## 2023-06-05 NOTE — Progress Notes (Signed)
Virtual Visit via Video Note  I connected with Molly Miller on 06/05/23 at  3:00 PM EDT by a video enabled telemedicine application and verified that I am speaking with the correct person using two identifiers.  Location Provider Location : [facility] Patient Location : Work  Participants: Patient , Provider    I discussed the limitations of evaluation and management by telemedicine and the availability of in person appointments. The patient expressed understanding and agreed to proceed.   I discussed the assessment and treatment plan with the patient. The patient was provided an opportunity to ask questions and all were answered. The patient agreed with the plan and demonstrated an understanding of the instructions.   The patient was advised to call back or seek an in-person evaluation if the symptoms worsen or if the condition fails to improve as anticipated.   BH MD OP Progress Note  06/05/2023 3:24 PM Molly Miller  MRN:  [id number]  Chief Complaint:  Chief Complaint  Patient presents with   Follow-up   Medication Refill   Depression   Anxiety   HPI: Molly Miller is a 42 year old Caucasian female divorced, currently lives with her boyfriend in [place], has a history of bipolar 2 disorder, GAD, insomnia, bereavement, was evaluated by telemedicine today.  Patient today reports she is currently doing better overall than previous visit.  She reports anxiety symptoms are more manageable.  She reports she is currently compliant on her medications including Lamictal and that is beneficial.  Denies side effects.  She continues to struggle with leg swellings and reports she has to wear compression stockings and there is nothing else her providers could do about it.  She reports sleep is overall improving.  She is taking magnesium at night and currently gets solid 5 hours which is an improvement for her.  She continues to be motivated to work on [employer].  She does have  trazodone available which she takes rarely only.  That does help.  She denies any nightmares.  Patient reports she is coping with her grief better than before although she reports some days can be harder.  Patient reports she was able to start treatment for weight loss on an online platform and is currently back on the semaglutide.  She has lost 8 pounds in the past 2 weeks and feels good about it.  Patient denies any suicidality, homicidality or perceptual disturbances.  Patient denies any other concerns today.  Visit Diagnosis:    ICD-10-CM   1. Bipolar disorder, in full remission, most recent episode depressed (HCC)  F31.76    Type 2, mild    2. GAD (generalized anxiety disorder)  F41.1 L-Methylfolate 7.5 MG TABS    3. Insomnia due to medical condition  G47.01    Anxiety    4. Bereavement  Z63.4       Past Psychiatric History: I have reviewed past psychiatric history from progress note on 08/09/2020.  Past trials of venlafaxine.  Past Medical History:  Past Medical History:  Diagnosis Date   Acne    Anxiety    Depression    Kidney stone    Pre-diabetes     Past Surgical History:  Procedure Laterality Date   CYSTOSCOPY W/ RETROGRADES Right 02/01/2022   Procedure: CYSTOSCOPY WITH RETROGRADE PYELOGRAM;  Surgeon: Sondra Come, MD;  Location: [facility];  Service: Urology;  Laterality: Right;   CYSTOSCOPY W/ URETERAL STENT PLACEMENT Right 02/14/2016   Procedure: CYSTOSCOPY WITH STENT EXCHANGE;  Surgeon: Hildred Laser, MD;  Location: [facility];  Service: Urology;  Laterality: Right;   CYSTOSCOPY WITH STENT PLACEMENT Right 01/14/2016   Procedure: RIGHT CYSTOSCOPY, RIGHT RETROGRADE, RIGHT URETERAL STENT PLACEMENT;  Surgeon: Crist Fat, MD;  Location: [facility];  Service: Urology;  Laterality: Right;   CYSTOSCOPY/URETEROSCOPY/HOLMIUM LASER/STENT PLACEMENT Right 01/16/2022   Procedure: CYSTOSCOPY/URETEROSCOPY/ RIGHT URETERAL STENT PLACEMENT;  Surgeon: Sondra Come,  MD;  Location: [facility];  Service: Urology;  Laterality: Right;   CYSTOSCOPY/URETEROSCOPY/HOLMIUM LASER/STENT PLACEMENT Right 02/01/2022   Procedure: CYSTOSCOPY/URETEROSCOPY/HOLMIUM LASER/STENT PLACEMENT;  Surgeon: Sondra Come, MD;  Location: [facility];  Service: Urology;  Laterality: Right;   HOLMIUM LASER APPLICATION Right 02/14/2016   Procedure: HOLMIUM LASER APPLICATION;  Surgeon: Hildred Laser, MD;  Location: [facility];  Service: Urology;  Laterality: Right;   URETEROSCOPY WITH HOLMIUM LASER LITHOTRIPSY Right 02/14/2016   Procedure: URETEROSCOPY WITH HOLMIUM LASER LITHOTRIPSY;  Surgeon: Hildred Laser, MD;  Location: [facility];  Service: Urology;  Laterality: Right;    Family Psychiatric History: I have reviewed family psychiatric history from progress note on 08/09/2020.  Family History:  Family History  Problem Relation Age of Onset   Stroke Mother    Seizures Mother    Dementia Mother    Cancer Mother        Cervical?   Nephrolithiasis Mother    Hematuria Mother    Depression Mother    Hypertension Father    Arthritis Father    Heart disease Father    Depression Sister     Social History: I have reviewed social history from progress note on 08/09/2020. Social History   Socioeconomic History   Marital status: Divorced    Spouse name: Not on file   Number of children: Not on file   Years of education: Not on file   Highest education level: Not on file  Occupational History   Not on file  Tobacco Use   Smoking status: Former    Current packs/day: 0.00    Types: Cigarettes    Quit date: 04/05/2020    Years since quitting: 3.1    Passive exposure: Past   Smokeless tobacco: Never   Tobacco comments:    june 2021  Vaping Use   Vaping status: Every Day   Substances: Nicotine   Devices: occa  Substance and Sexual Activity   Alcohol use: Yes    Alcohol/week: 0.0 standard drinks of alcohol    Comment: social   Drug use: No   Sexual activity: Not on file   Other Topics Concern   Not on file  Social History Narrative   Single   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      Social Determinants of Health   Financial Resource Strain: Not on file  Food Insecurity: Not on file  Transportation Needs: Not on file  Physical Activity: Not on file  Stress: Not on file  Social Connections: Not on file    Allergies:  Allergies  Allergen Reactions   Tramadol Nausea Only    Metabolic Disorder Labs: Lab Results  Component Value Date   HGBA1C 5.9 10/19/2021   No results found for: "PROLACTIN" Lab Results  Component Value Date   CHOL 183 10/19/2021   TRIG 256.0 (H) 10/19/2021   HDL 35.90 (L) 10/19/2021   CHOLHDL 5 10/19/2021   VLDL 51.2 (H) 10/19/2021   Lab Results  Component Value Date   TSH 1.70 08/21/2020  TSH 1.520 06/02/2019    Therapeutic Level Labs: No results found for: "LITHIUM" No results found for: "VALPROATE" No results found for: "CBMZ"  Current Medications: Current Outpatient Medications  Medication Sig Dispense Refill   acetaminophen (TYLENOL) 500 MG tablet Take 500 mg by mouth every 6 (six) hours as needed.     cetirizine (ZYRTEC) 10 MG tablet Take 10 mg by mouth daily.     DULoxetine (CYMBALTA) 20 MG capsule TAKE 1 CAPSULE(20 MG) BY MOUTH TWICE DAILY 180 capsule 0   famotidine (PEPCID) 10 MG tablet Take 10 mg by mouth daily.     furosemide (LASIX) 20 MG tablet Take 1 tablet by mouth daily for 3 days as needed for swelling. 30 tablet 0   L-Methylfolate 7.5 MG TABS Take 1 tablet (7.5 mg total) by mouth every morning. 30 tablet 11   lamoTRIgine (LAMICTAL) 25 MG tablet TAKE 1 TABLET BY MOUTH EVERY MORNING AND 2 EVERY EVENING 90 tablet 1   magnesium 30 MG tablet Take 30 mg by mouth 2 (two) times daily.     traZODone (DESYREL) 50 MG tablet Take 0.5-1 tablets (25-50 mg total) by mouth at bedtime as needed for sleep. (Patient not taking: Reported on 03/20/2023) 90 tablet 0    No current facility-administered medications for this visit.     Musculoskeletal: Strength & Muscle Tone:  UTA Gait & Station:  Seated Patient leans: N/A  Psychiatric Specialty Exam: Review of Systems  Psychiatric/Behavioral:  Positive for sleep disturbance. The patient is nervous/anxious.        Grieving    There were no vitals taken for this visit.There is no height or weight on file to calculate BMI.  General Appearance: Casual  Eye Contact:  Fair  Speech:  Clear and Coherent  Volume:  Normal  Mood:  Anxious, grief-improving  Affect:  Appropriate  Thought Process:  Goal Directed and Descriptions of Associations: Intact  Orientation:  Full (Time, Place, and Person)  Thought Content: Logical   Suicidal Thoughts:  No  Homicidal Thoughts:  No  Memory:  Immediate;   Fair Recent;   Fair Remote;   Fair  Judgement:  Fair  Insight:  Fair  Psychomotor Activity:  Normal  Concentration:  Concentration: Fair and Attention Span: Fair  Recall:  [employer] of Knowledge: Fair  Language: Fair  Akathisia:  No  Handed:  Right  AIMS (if indicated): not done  Assets:  Communication Skills Desire for Improvement Housing Intimacy Social Support  ADL's:  Intact  Cognition: WNL  Sleep:   improving   Screenings: AIMS    Flowsheet Row Video Visit from 08/15/2022 in [facility]  AIMS Total Score 0      GAD-7    Flowsheet Row Video Visit from 10/23/2022 in [facility] Video Visit from 08/15/2022 in [facility] Video Visit from 03/07/2022 in [facility] Video Visit from 01/09/2022 in [facility] Video Visit from 08/09/2020 in [facility]  Total GAD-7 Score 3 13 6 5 14       PHQ2-9    Flowsheet Row Office Visit from 02/14/2023 in [facility] Video Visit from 10/23/2022 in [facility] Video Visit from 08/15/2022 in [facility] Video Visit from 03/07/2022 in [facility] Video Visit from 01/09/2022 in  [facility]  PHQ-2 Total Score 2 1 2 1  0  PHQ-9 Total Score 9 -- 11 4 --      Flowsheet Row Video Visit from 06/05/2023 in [facility] Video Visit from 02/25/2023 in [facility] Video Visit from 01/28/2023 in [facility]  C-SSRS RISK CATEGORY No Risk No Risk No Risk        Assessment and Plan: Molly Miller is a 42 year old Caucasian female, employed, divorced, lives in [place], has a history of bipolar disorder, GAD was evaluated by telemedicine today.  Patient is currently improving, will benefit from the following plan.  Plan Bipolar disorder type II depressed in remission Lamotrigine 75 mg p.o. daily in divided dosage. Cymbalta 20 mg p.o. twice daily  GAD-improving Cymbalta 20 mg p.o. twice daily Deplin 7.5 mg p.o. daily for folic acid conversion reduction She is no longer taking the hydroxyzine 25-50 mg p.o. nightly as needed although available.  She does have Zyrtec that she takes for seasonal allergies and hence has been trying to avoid combining these 2 medications.  Insomnia-improving She currently is magnesium over-the-counter and that helps. She also has trazodone 25-50 mg p.o. nightly as needed available which she rarely takes.  Bereavement-improving Patient is currently coping with her grief better.  Follow-up in clinic in 4 months or sooner in person.   Consent: Patient/Guardian gives verbal consent for treatment and assignment of benefits for services provided during this visit. Patient/Guardian  expressed understanding and agreed to proceed.  This note was generated in part or whole with voice recognition software. Voice recognition is usually quite accurate but there are transcription errors that can and very often do occur. I apologize for any typographical errors that were not detected and corrected.     Jomarie Longs, MD 06/05/2023, 3:24 PM

## 2023-07-16 ENCOUNTER — Other Ambulatory Visit: Payer: Self-pay | Admitting: Psychiatry

## 2023-07-16 DIAGNOSIS — F411 Generalized anxiety disorder: Secondary | ICD-10-CM

## 2023-07-16 DIAGNOSIS — F3181 Bipolar II disorder: Secondary | ICD-10-CM

## 2023-08-05 ENCOUNTER — Other Ambulatory Visit: Payer: Self-pay | Admitting: Psychiatry

## 2023-08-05 DIAGNOSIS — F411 Generalized anxiety disorder: Secondary | ICD-10-CM

## 2023-09-14 ENCOUNTER — Other Ambulatory Visit: Payer: Self-pay | Admitting: Psychiatry

## 2023-09-14 DIAGNOSIS — F3181 Bipolar II disorder: Secondary | ICD-10-CM

## 2023-09-14 DIAGNOSIS — F411 Generalized anxiety disorder: Secondary | ICD-10-CM

## 2023-10-03 ENCOUNTER — Other Ambulatory Visit: Payer: Self-pay | Admitting: Psychiatry

## 2023-10-03 DIAGNOSIS — F3181 Bipolar II disorder: Secondary | ICD-10-CM

## 2023-10-03 DIAGNOSIS — F411 Generalized anxiety disorder: Secondary | ICD-10-CM

## 2023-10-06 ENCOUNTER — Encounter: Payer: Self-pay | Admitting: Psychiatry

## 2023-10-06 ENCOUNTER — Ambulatory Visit: Payer: 59 | Admitting: Psychiatry

## 2023-10-06 VITALS — BP 121/82 | HR 85 | Temp 97.5°F | Wt 229.8 lb

## 2023-10-06 DIAGNOSIS — G4701 Insomnia due to medical condition: Secondary | ICD-10-CM

## 2023-10-06 DIAGNOSIS — F411 Generalized anxiety disorder: Secondary | ICD-10-CM | POA: Diagnosis not present

## 2023-10-06 DIAGNOSIS — F3176 Bipolar disorder, in full remission, most recent episode depressed: Secondary | ICD-10-CM

## 2023-10-06 DIAGNOSIS — Z634 Disappearance and death of family member: Secondary | ICD-10-CM

## 2023-10-06 MED ORDER — LAMOTRIGINE 25 MG PO TABS: 25.0000 mg | ORAL_TABLET | Freq: Two times a day (BID) | ORAL | 1 refills | Status: DC

## 2023-10-06 NOTE — Patient Instructions (Addendum)
[facility] - Call [phone]  Please TSH labs, platelet count .

## 2023-10-06 NOTE — Progress Notes (Signed)
BH MD OP Progress Note  10/06/2023 9:08 AM Molly Miller  MRN:  [id number]  Chief Complaint:  Chief Complaint  Patient presents with   Follow-up   Depression   Anxiety   grief   Medication Refill   HPI: Molly Miller is a 42 year old Caucasian female, divorced, currently lives with her boyfriend in [place], has a history of bipolar disorder type II, GAD, insomnia, bereavement was evaluated in office today.  Patient today presented late for the appointment since she was stuck in traffic.  Patient reports she just lost her uncle and sister recently.  She is grieving the losses however has been coping okay.  She reports she does have ' bad moments' and some days can be a struggle.  She however has been taking it day-by-day, tries to push herself to do certain activities to distract herself.  Simple things makes a difference like having the Christmas light.  Patient reports work is going well.  Currently taking care of her ' bonus daughter's ' newborn most nights.  That does affect her sleep.  Gets at least around 5 hours of sleep most nights which is an improvement.  Does take magnesium at night.  Does have trazodone available although has not been using it much.  Currently compliant on the Cymbalta as prescribed.  Denies side effects.  Has been taking the Lamictal 50 mg daily only.  Has not been taking the 75 mg since it caused side effects.  Patient overall reports mood symptoms are stable and denies any mania or hypomanic symptoms or significant depression symptoms.  Patient denies any suicidality, homicidality or perceptual disturbances.  Patient denies any other concerns today.  Visit Diagnosis:    ICD-10-CM   1. Bipolar disorder, in full remission, most recent episode depressed (HCC)  F31.76    Type 2 mild    2. GAD (generalized anxiety disorder)  F41.1 lamoTRIgine (LAMICTAL) 25 MG tablet    3. Insomnia due to medical condition  G47.01    Lack of sleep hygiene, mood     4. Bereavement  Z63.4       Past Psychiatric History: I have reviewed past psychiatric history from progress note on 08/09/2020.  Past trials of venlafaxine.  Past Medical History:  Past Medical History:  Diagnosis Date   Acne    Anxiety    Depression    Kidney stone    Pre-diabetes     Past Surgical History:  Procedure Laterality Date   CYSTOSCOPY W/ RETROGRADES Right 02/01/2022   Procedure: CYSTOSCOPY WITH RETROGRADE PYELOGRAM;  Surgeon: Sondra Come, MD;  Location: [facility];  Service: Urology;  Laterality: Right;   CYSTOSCOPY W/ URETERAL STENT PLACEMENT Right 02/14/2016   Procedure: CYSTOSCOPY WITH STENT EXCHANGE;  Surgeon: Hildred Laser, MD;  Location: [facility];  Service: Urology;  Laterality: Right;   CYSTOSCOPY WITH STENT PLACEMENT Right 01/14/2016   Procedure: RIGHT CYSTOSCOPY, RIGHT RETROGRADE, RIGHT URETERAL STENT PLACEMENT;  Surgeon: Crist Fat, MD;  Location: [facility];  Service: Urology;  Laterality: Right;   CYSTOSCOPY/URETEROSCOPY/HOLMIUM LASER/STENT PLACEMENT Right 01/16/2022   Procedure: CYSTOSCOPY/URETEROSCOPY/ RIGHT URETERAL STENT PLACEMENT;  Surgeon: Sondra Come, MD;  Location: [facility];  Service: Urology;  Laterality: Right;   CYSTOSCOPY/URETEROSCOPY/HOLMIUM LASER/STENT PLACEMENT Right 02/01/2022   Procedure: CYSTOSCOPY/URETEROSCOPY/HOLMIUM LASER/STENT PLACEMENT;  Surgeon: Sondra Come, MD;  Location: [facility];  Service: Urology;  Laterality: Right;   HOLMIUM LASER APPLICATION Right 02/14/2016   Procedure: HOLMIUM LASER APPLICATION;  Surgeon: Hildred Laser,  MD;  Location: [facility];  Service: Urology;  Laterality: Right;   URETEROSCOPY WITH HOLMIUM LASER LITHOTRIPSY Right 02/14/2016   Procedure: URETEROSCOPY WITH HOLMIUM LASER LITHOTRIPSY;  Surgeon: Hildred Laser, MD;  Location: [facility];  Service: Urology;  Laterality: Right;    Family Psychiatric History: I have reviewed family psychiatric history from progress note on  08/09/2020.  Family History:  Family History  Problem Relation Age of Onset   Stroke Mother    Seizures Mother    Dementia Mother    Cancer Mother        Cervical?   Nephrolithiasis Mother    Hematuria Mother    Depression Mother    Hypertension Father    Arthritis Father    Heart disease Father    Depression Sister     Social History: I have reviewed social history from progress note on 08/09/2020. Social History   Socioeconomic History   Marital status: Divorced    Spouse name: Not on file   Number of children: Not on file   Years of education: Not on file   Highest education level: Not on file  Occupational History   Not on file  Tobacco Use   Smoking status: Former    Current packs/day: 0.00    Types: Cigarettes    Quit date: 04/05/2020    Years since quitting: 3.5    Passive exposure: Past   Smokeless tobacco: Never   Tobacco comments:    june 2021  Vaping Use   Vaping status: Every Day   Substances: Nicotine   Devices: occa  Substance and Sexual Activity   Alcohol use: Yes    Alcohol/week: 0.0 standard drinks of alcohol    Comment: social   Drug use: No   Sexual activity: Not on file  Other Topics Concern   Not on file  Social History Narrative   Single   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      Social Determinants of Health   Financial Resource Strain: Not on file  Food Insecurity: Not on file  Transportation Needs: Not on file  Physical Activity: Not on file  Stress: Not on file  Social Connections: Not on file    Allergies:  Allergies  Allergen Reactions   Tramadol Nausea Only    Metabolic Disorder Labs: Lab Results  Component Value Date   HGBA1C 5.9 10/19/2021   No results found for: "PROLACTIN" Lab Results  Component Value Date   CHOL 183 10/19/2021   TRIG 256.0 (H) 10/19/2021   HDL 35.90 (L) 10/19/2021   CHOLHDL 5 10/19/2021   VLDL 51.2 (H) 10/19/2021   Lab Results   Component Value Date   TSH 1.70 08/21/2020   TSH 1.520 06/02/2019    Therapeutic Level Labs: No results found for: "LITHIUM" No results found for: "VALPROATE" No results found for: "CBMZ"  Current Medications: Current Outpatient Medications  Medication Sig Dispense Refill   acetaminophen (TYLENOL) 500 MG tablet Take 500 mg by mouth every 6 (six) hours as needed.     cetirizine (ZYRTEC) 10 MG tablet Take 10 mg by mouth daily.     DULoxetine (CYMBALTA) 20 MG capsule TAKE 1 CAPSULE(20 MG) BY MOUTH TWICE DAILY 180 capsule 0   famotidine (PEPCID) 10 MG tablet Take 10 mg by mouth daily.     furosemide (LASIX) 20 MG tablet Take 1 tablet by mouth daily for 3 days as needed for swelling. 30 tablet 0  L-Methylfolate 7.5 MG TABS TAKE 1 TABLET BY MOUTH EACH MORNING 30 tablet 11   magnesium 30 MG tablet Take 30 mg by mouth at bedtime as needed.     Nutritional Supplements (LIPOTROPIC COMPLEX PO) Take by mouth. Takes as injectable     tirzepatide (ZEPBOUND) 10 MG/0.5ML Pen Inject 10 mg into the skin once a week.     traZODone (DESYREL) 50 MG tablet Take 0.5-1 tablets (25-50 mg total) by mouth at bedtime as needed for sleep. 90 tablet 0   lamoTRIgine (LAMICTAL) 25 MG tablet Take 1 tablet (25 mg total) by mouth 2 (two) times daily. 180 tablet 1   No current facility-administered medications for this visit.     Musculoskeletal: Strength & Muscle Tone: within normal limits Gait & Station: normal Patient leans: N/A  Psychiatric Specialty Exam: Review of Systems  Psychiatric/Behavioral:  Positive for sleep disturbance.        Grief    Blood pressure 121/82, pulse 85, temperature (!) 97.5 F (36.4 C), temperature source Skin, weight 229 lb 12.8 oz (104.2 kg).Body mass index is 33.94 kg/m.  General Appearance: Fairly Groomed  Eye Contact:  Fair  Speech:  Clear and Coherent  Volume:  Normal  Mood:   grief  Affect:  Appropriate  Thought Process:  Goal Directed and Descriptions of  Associations: Intact  Orientation:  Full (Time, Place, and Person)  Thought Content: Logical   Suicidal Thoughts:  No  Homicidal Thoughts:  No  Memory:  Immediate;   Fair Recent;   Fair Remote;   Fair  Judgement:  Fair  Insight:  Fair  Psychomotor Activity:  Normal  Concentration:  Concentration: Fair and Attention Span: Fair  Recall:  [employer] of Knowledge: Fair  Language: Fair  Akathisia:  No  Handed:  Right  AIMS (if indicated): not done  Assets:  Communication Skills Desire for Improvement Housing Social Support Talents/Skills Transportation  ADL's:  Intact  Cognition: WNL  Sleep:   restless   Screenings: AIMS    Flowsheet Row Video Visit from 08/15/2022 in [facility]  AIMS Total Score 0      GAD-7    Flowsheet Row Office Visit from 10/06/2023 in [facility] Video Visit from 10/23/2022 in [facility] Video Visit from 08/15/2022 in [facility] Video Visit from 03/07/2022 in [facility] Video Visit from 01/09/2022 in [facility]  Total GAD-7 Score 9 3 13 6 5       PHQ2-9    Flowsheet Row Office Visit from 10/06/2023 in [facility] Office Visit from 02/14/2023 in [facility] Video Visit from 10/23/2022 in [facility] Video Visit from 08/15/2022 in [facility] Video Visit from 03/07/2022 in [facility]  PHQ-2 Total Score 2 2 1 2 1   PHQ-9 Total Score 7 9 -- 11 4      Flowsheet Row Office Visit from 10/06/2023 in [facility] Video Visit from 06/05/2023 in [facility] Video Visit from 02/25/2023 in [facility]  C-SSRS RISK CATEGORY No Risk No Risk No Risk        Assessment and Plan: Molly Miller is a 42 year old Caucasian female, employed, divorced, lives in [place],  has a history of bipolar disorder, GAD was evaluated in office today.  Patient currently grieving although coping okay, discussed plan as noted below.  Plan Bipolar disorder type II depressed in remission Lamotrigine 25 mg twice daily.  Patient did not tolerate the 75 mg. Cymbalta 20 mg p.o. twice daily  GAD-stable Cymbalta 20 mg p.o. twice daily Deplin 7.5 mg p.o. daily for folic acid conversion reduction.  Insomnia-improving Magnesium over-the-counter helps Trazodone 25-50 mg p.o. nightly as needed Patient to work on sleep hygiene  Bereavement-improving Patient grieving the loss of sister as well as uncle.  Patient not interested in referral for therapy at this time. Patient to make use of coping strategies.  Patient will benefit from labs including TSH, platelet count, patient to get this at primary care provider's office, will consider ordering it at next visit if not already done.  Follow-up in clinic in 5 months or sooner if needed.   Collaboration of Care: Collaboration of Care: Patient refused AEB patient not interested in referral for CBT  Patient/Guardian was advised Release of Information must be obtained prior to any record release in order to collaborate their care with an outside provider. Patient/Guardian was advised if they have not already done so to contact the registration department to sign all necessary forms in order for [place] to release information regarding their care.   Consent: Patient/Guardian gives verbal consent for treatment and assignment of benefits for services provided during this visit. Patient/Guardian expressed understanding and agreed to proceed.  This note was generated in part or whole  with voice recognition software. Voice recognition is usually quite accurate but there are transcription errors that can and very often do occur. I apologize for any typographical errors that were not detected and corrected.     Jomarie Longs, MD 10/06/2023, 5:36 PM

## 2023-10-29 IMAGING — CT CT RENAL STONE PROTOCOL
2 of 4 series · 16 of 46 positions shown, 18 images · non-contrast
Comparison: Renal ultrasound dated 03/18/2016.

CLINICAL DATA: Flank pain.  Concern for kidney stone.



[Series 2: ap without · axial · non-contrast · 0.90mm/px · z∈[-1123,-648]mm · 13 of 111 slices shown, 15 images]
[im 8/111  soft-tissue]
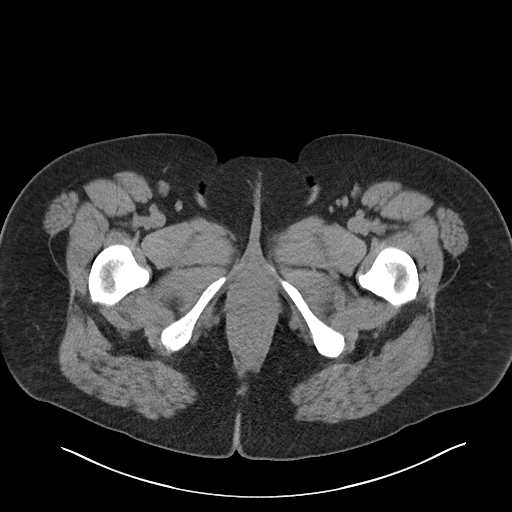
[im 8/111  bone]
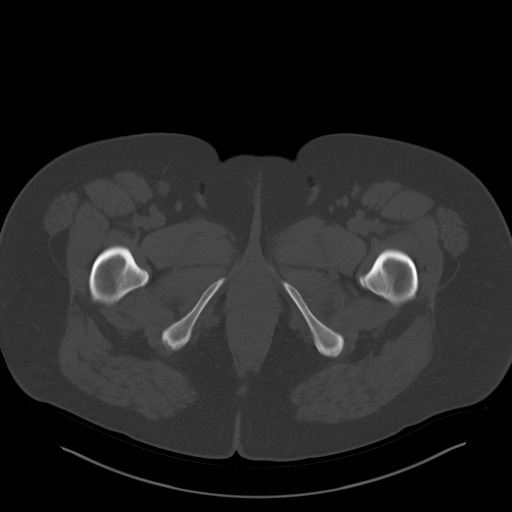
[im 15/111  soft-tissue]
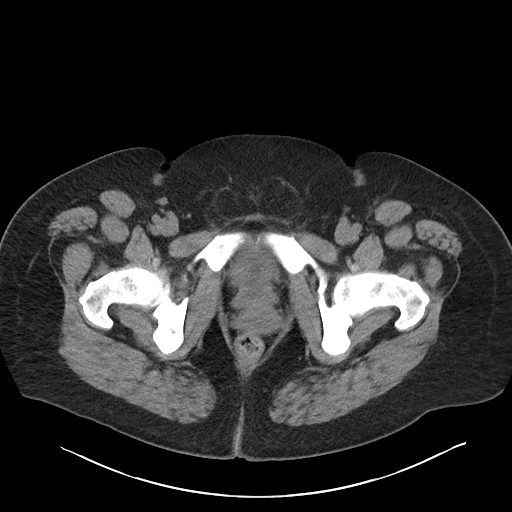
[im 23/111  soft-tissue]
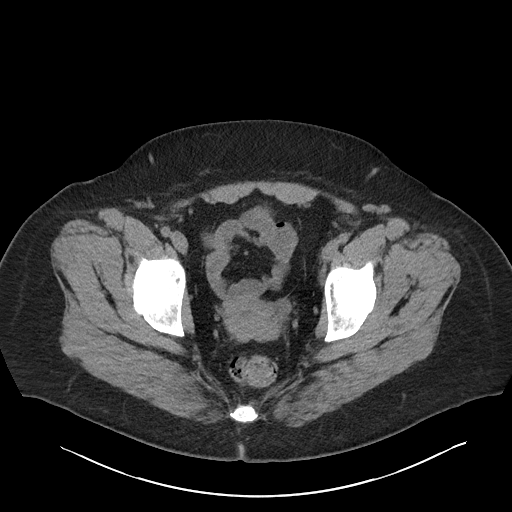
[im 30/111  soft-tissue]
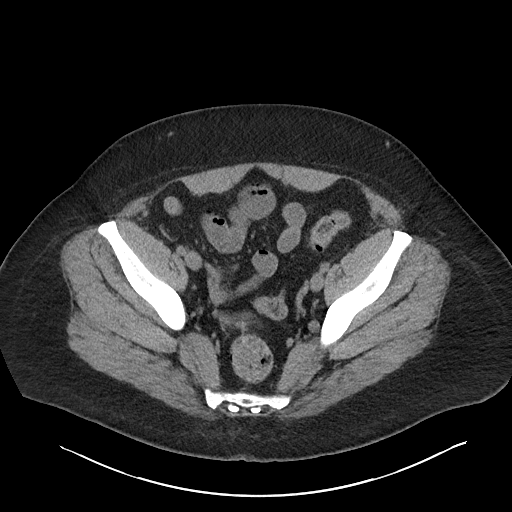
[im 37/111  soft-tissue]
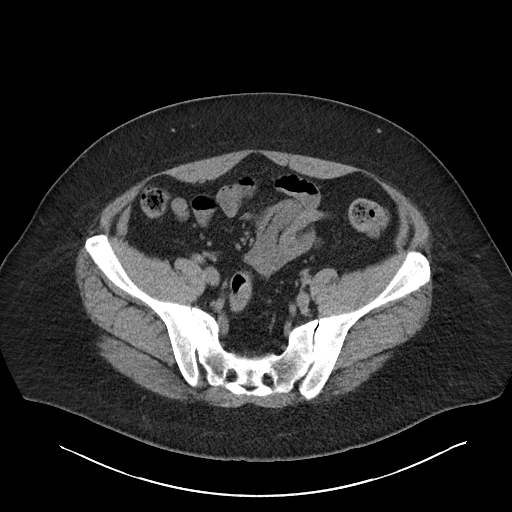
[im 45/111  soft-tissue]
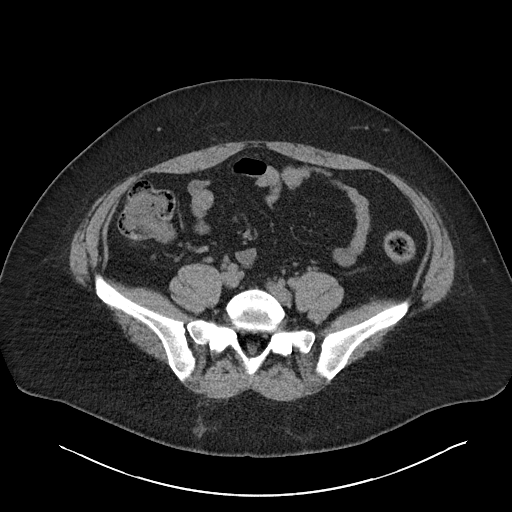
[im 59/111  soft-tissue]
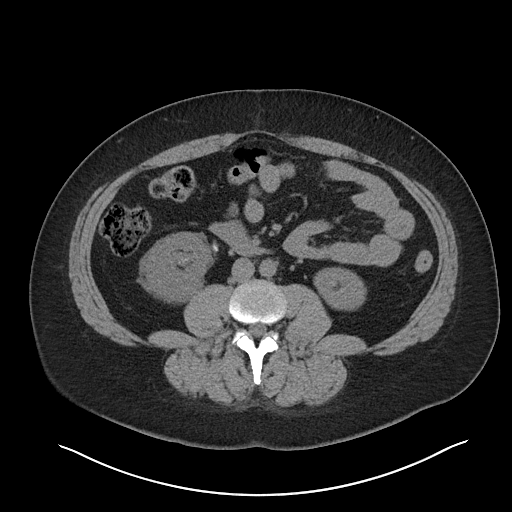
[im 67/111  soft-tissue]
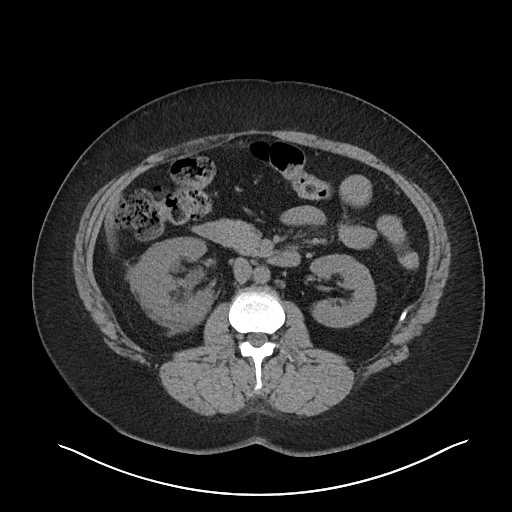
[im 74/111  soft-tissue]
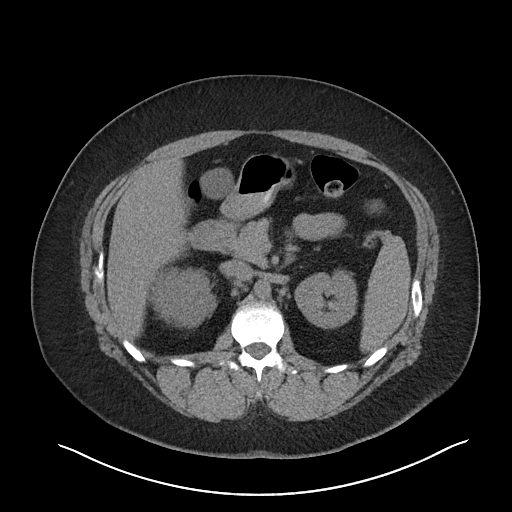
[im 74/111  bone]
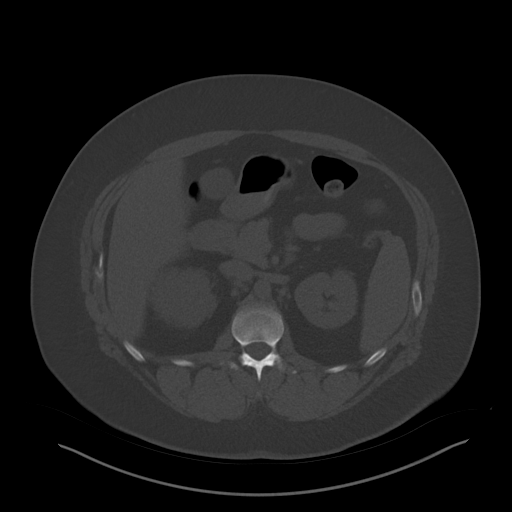
[im 81/111  soft-tissue]
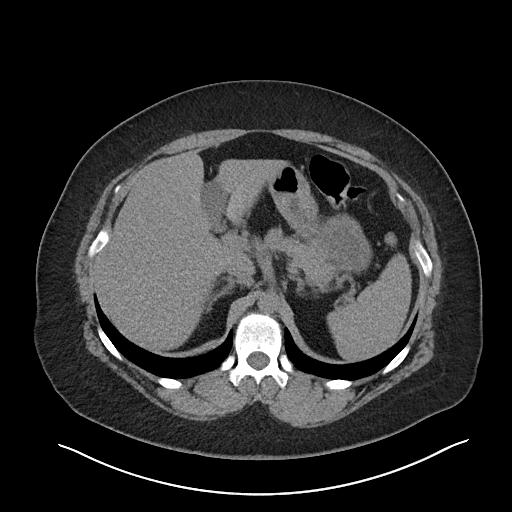
[im 89/111  soft-tissue]
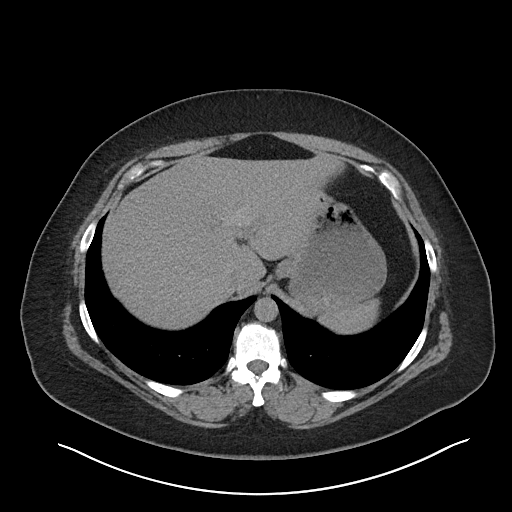
[im 96/111  soft-tissue]
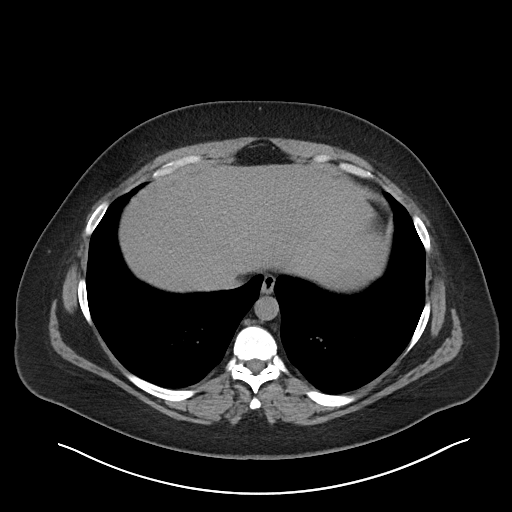
[im 103/111  soft-tissue]
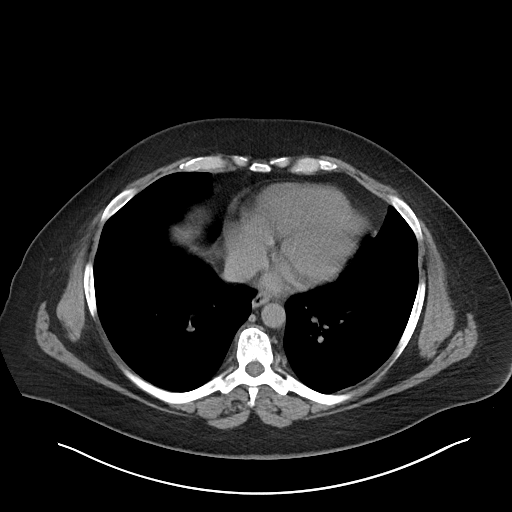

[Series 5: cor · coronal · 0.97mm/px · 3 of 94 slices shown]
[im 32/94  soft-tissue]
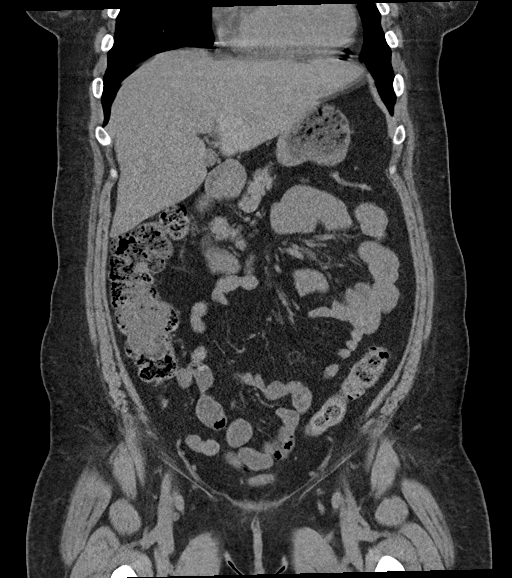
[im 42/94  soft-tissue]
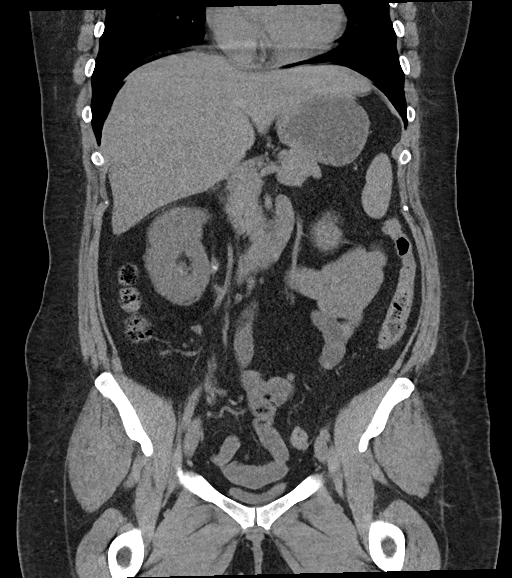
[im 52/94  soft-tissue]
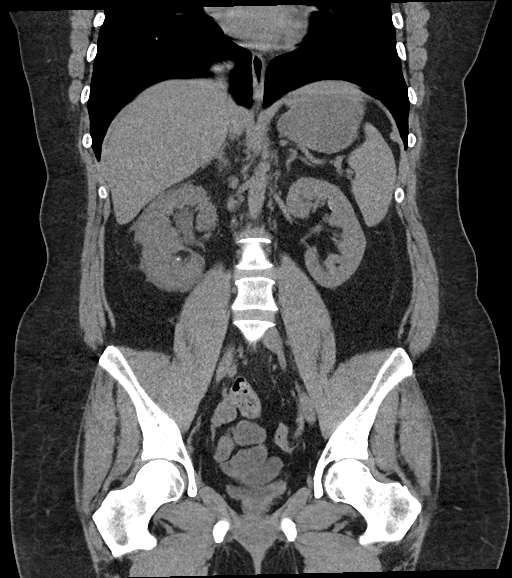

[16 of 46 positions shown; findings below may reference images not displayed]

FINDINGS: Evaluation of this exam is limited in the absence of intravenous
contrast.

Lower chest: The visualized lung bases are clear.

No intra-abdominal free air or free fluid.

Hepatobiliary: Mild fatty liver. No intrahepatic biliary dilatation.
The gallbladder is unremarkable.

Pancreas: Unremarkable. No pancreatic ductal dilatation or
surrounding inflammatory changes.

Spleen: Normal in size without focal abnormality.

Adrenals/Urinary Tract: The adrenal glands are unremarkable. There
is an 8 mm stone at the right ureteropelvic junction with mild right
hydronephrosis. There is an 18 mm right renal upper pole calculus as
well as several additional smaller nonobstructing calculi in the
right kidney. A 3 mm left renal upper pole nonobstructing stone as
well as several punctate nonobstructing left renal inferior pole
calculi noted. No hydronephrosis on the left. The ureters appear
unremarkable. The urinary bladder is collapsed.

Stomach/Bowel: There is no bowel obstruction or active inflammation.
The appendix is normal.

Vascular/Lymphatic: The abdominal aorta and IVC are unremarkable on
this noncontrast CT. No portal venous gas. There is no adenopathy.

Reproductive: The uterus and ovaries are grossly unremarkable. No
pelvic mass.

Other: None

Musculoskeletal: No acute or significant osseous findings.
IMPRESSION: 1. A 8 mm right UPJ calculus with mild right hydronephrosis.
Additional nonobstructing bilateral renal calculi. No hydronephrosis
on the left.
2. Mild fatty liver.
3. No bowel obstruction. Normal appendix.

## 2023-11-25 ENCOUNTER — Encounter: Payer: 59 | Admitting: Primary Care

## 2023-11-25 ENCOUNTER — Encounter: Payer: Self-pay | Admitting: Primary Care

## 2023-12-05 ENCOUNTER — Ambulatory Visit (INDEPENDENT_AMBULATORY_CARE_PROVIDER_SITE_OTHER): Payer: 59 | Admitting: Primary Care

## 2023-12-05 VITALS — BP 130/86 | HR 85 | Temp 97.7°F | Ht 69.0 in | Wt 215.0 lb

## 2023-12-05 DIAGNOSIS — I872 Venous insufficiency (chronic) (peripheral): Secondary | ICD-10-CM | POA: Diagnosis not present

## 2023-12-05 DIAGNOSIS — E6609 Other obesity due to excess calories: Secondary | ICD-10-CM | POA: Diagnosis not present

## 2023-12-05 DIAGNOSIS — R7303 Prediabetes: Secondary | ICD-10-CM

## 2023-12-05 DIAGNOSIS — Z01419 Encounter for gynecological examination (general) (routine) without abnormal findings: Secondary | ICD-10-CM

## 2023-12-05 DIAGNOSIS — Z6831 Body mass index (BMI) 31.0-31.9, adult: Secondary | ICD-10-CM

## 2023-12-05 DIAGNOSIS — F3181 Bipolar II disorder: Secondary | ICD-10-CM

## 2023-12-05 DIAGNOSIS — F32A Depression, unspecified: Secondary | ICD-10-CM

## 2023-12-05 DIAGNOSIS — Z1231 Encounter for screening mammogram for malignant neoplasm of breast: Secondary | ICD-10-CM

## 2023-12-05 DIAGNOSIS — Z0000 Encounter for general adult medical examination without abnormal findings: Secondary | ICD-10-CM

## 2023-12-05 DIAGNOSIS — E66811 Obesity, class 1: Secondary | ICD-10-CM | POA: Diagnosis not present

## 2023-12-05 DIAGNOSIS — F419 Anxiety disorder, unspecified: Secondary | ICD-10-CM

## 2023-12-05 LAB — COMPREHENSIVE METABOLIC PANEL
ALT: 11 U/L (ref 0–35)
AST: 12 U/L (ref 0–37)
Albumin: 4.4 g/dL (ref 3.5–5.2)
Alkaline Phosphatase: 75 U/L (ref 39–117)
BUN: 18 mg/dL (ref 6–23)
CO2: 28 meq/L (ref 19–32)
Calcium: 9.2 mg/dL (ref 8.4–10.5)
Chloride: 104 meq/L (ref 96–112)
Creatinine, Ser: 0.7 mg/dL (ref 0.40–1.20)
GFR: 106.37 mL/min (ref >60.00)
Glucose, Bld: 86 mg/dL (ref 70–99)
Potassium: 4.4 meq/L (ref 3.5–5.1)
Sodium: 139 meq/L (ref 135–145)
Total Bilirubin: 0.6 mg/dL (ref 0.2–1.2)
Total Protein: 6.7 g/dL (ref 6.0–8.3)

## 2023-12-05 LAB — LIPID PANEL
Cholesterol: 148 mg/dL (ref 0–200)
HDL: 38.4 mg/dL — ABNORMAL LOW (ref >39.00)
LDL Cholesterol: 94 mg/dL (ref 0–99)
NonHDL: 109.34
Total CHOL/HDL Ratio: 4
Triglycerides: 77 mg/dL (ref 0.0–149.0)
VLDL: 15.4 mg/dL (ref 0.0–40.0)

## 2023-12-05 LAB — TSH: TSH: 1.68 u[IU]/mL (ref 0.35–5.50)

## 2023-12-05 LAB — VITAMIN D 25 HYDROXY (VIT D DEFICIENCY, FRACTURES): VITD: 52.8 ng/mL (ref 30.00–100.00)

## 2023-12-05 LAB — HEMOGLOBIN A1C: Hgb A1c MFr Bld: 5.5 % (ref 4.6–6.5)

## 2023-12-05 NOTE — Assessment & Plan Note (Signed)
Immunizations UTD. Declines influenza vaccine.  Pap smear due, she declines today but would like to establish with GYN. Mammogram due, orders placed.  Discussed the importance of a healthy diet and regular exercise in order for weight loss, and to reduce the risk of further co-morbidity.  Exam stable. Labs pending.  Follow up in 1 year for repeat physical.

## 2023-12-05 NOTE — Assessment & Plan Note (Signed)
Commended her on weight loss!  Following with online provider. Continue Zepbound 10 mg weekly.   Encouraged exercise.

## 2023-12-05 NOTE — Progress Notes (Signed)
Subjective:    Patient ID: Molly Miller, female    DOB: July 28, 1981, 43 y.o.   MRN: [id number]  HPI  Kikue Gerhart Kestler is a very pleasant 43 y.o. female who presents today for complete physical and follow up of chronic conditions.  Immunizations: -Tetanus: Completed in 2019 -Influenza: Declines influenza vaccine.   Diet: Fair diet.  Exercise: No regular exercise.  Eye exam: Completed years ago  Dental exam: Completed several years ago   Pap Smear: July 2020 Mammogram: Never completed   BP Readings from Last 3 Encounters:  12/05/23 130/86  10/06/23 121/82  04/08/23 132/89    Wt Readings from Last 3 Encounters:  12/05/23 215 lb (97.5 kg)  10/06/23 229 lb 12.8 oz (104.2 kg)  04/08/23 274 lb (124.3 kg)   Body mass index is 31.75 kg/m.      Review of Systems  Constitutional:  Negative for unexpected weight change.  HENT:  Negative for rhinorrhea.   Respiratory:  Negative for cough and shortness of breath.   Cardiovascular:  Negative for chest pain.  Gastrointestinal:  Negative for constipation and diarrhea.  Genitourinary:  Negative for difficulty urinating and menstrual problem.  Musculoskeletal:  Negative for arthralgias and myalgias.  Skin:  Negative for rash.  Allergic/Immunologic: Negative for environmental allergies.  Neurological:  Negative for dizziness, numbness and headaches.  Psychiatric/Behavioral:  The patient is not nervous/anxious.          Past Medical History:  Diagnosis Date   Abdominal bloating 10/16/2018   Acne    Acute left ankle pain 01/27/2019   Anxiety    Class 3 severe obesity due to excess calories with body mass index (BMI) of 40.0 to 44.9 in adult [facility]) 03/30/2015   COVID-19 virus infection 03/20/2023   Depression    Kidney stone    Pre-diabetes     Social History   Socioeconomic History   Marital status: Divorced    Spouse name: Not on file   Number of children: Not on file   Years of education: Not on file    Highest education level: 12th grade  Occupational History   Not on file  Tobacco Use   Smoking status: Former    Current packs/day: 0.00    Types: Cigarettes    Quit date: 04/05/2020    Years since quitting: 3.6    Passive exposure: Past   Smokeless tobacco: Never   Tobacco comments:    june 2021  Vaping Use   Vaping status: Every Day   Substances: Nicotine   Devices: occa  Substance and Sexual Activity   Alcohol use: Yes    Alcohol/week: 0.0 standard drinks of alcohol    Comment: social   Drug use: No   Sexual activity: Not on file  Other Topics Concern   Not on file  Social History Narrative   Single   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      Social Drivers of Corporate investment banker Strain: Low Risk  (12/05/2023)   Overall Financial Resource Strain (CARDIA)    Difficulty of Paying Living Expenses: Not hard at all  Food Insecurity: No Food Insecurity (12/05/2023)   Hunger Vital Sign    Worried About Running Out of Food in the Last Year: Never true    Ran Out of Food in the Last Year: Never true  Transportation Needs: No Transportation Needs (12/05/2023)   PRAPARE - Transportation    Lack  of Transportation (Medical): No    Lack of Transportation (Non-Medical): No  Physical Activity: Unknown (12/05/2023)   Exercise Vital Sign    Days of Exercise per Week: 0 days    Minutes of Exercise per Session: Not on file  Stress: No Stress Concern Present (12/05/2023)   [employer] of Occupational Health - Occupational Stress Questionnaire    Feeling of Stress : Only a little  Social Connections: Moderately Integrated (12/05/2023)   Social Connection and Isolation Panel [NHANES]    Frequency of Communication with Friends and Family: More than three times a week    Frequency of Social Gatherings with Friends and Family: Once a week    Attends Religious Services: More than 4 times per year    Active Member of [employer] or  Organizations: No    Attends Engineer, structural: Not on file    Marital Status: Living with partner  Intimate Partner Violence: Not on file    Past Surgical History:  Procedure Laterality Date   CYSTOSCOPY W/ RETROGRADES Right 02/01/2022   Procedure: CYSTOSCOPY WITH RETROGRADE PYELOGRAM;  Surgeon: Sondra Come, MD;  Location: [facility];  Service: Urology;  Laterality: Right;   CYSTOSCOPY W/ URETERAL STENT PLACEMENT Right 02/14/2016   Procedure: CYSTOSCOPY WITH STENT EXCHANGE;  Surgeon: Hildred Laser, MD;  Location: [facility];  Service: Urology;  Laterality: Right;   CYSTOSCOPY WITH STENT PLACEMENT Right 01/14/2016   Procedure: RIGHT CYSTOSCOPY, RIGHT RETROGRADE, RIGHT URETERAL STENT PLACEMENT;  Surgeon: Crist Fat, MD;  Location: [facility];  Service: Urology;  Laterality: Right;   CYSTOSCOPY/URETEROSCOPY/HOLMIUM LASER/STENT PLACEMENT Right 01/16/2022   Procedure: CYSTOSCOPY/URETEROSCOPY/ RIGHT URETERAL STENT PLACEMENT;  Surgeon: Sondra Come, MD;  Location: [facility];  Service: Urology;  Laterality: Right;   CYSTOSCOPY/URETEROSCOPY/HOLMIUM LASER/STENT PLACEMENT Right 02/01/2022   Procedure: CYSTOSCOPY/URETEROSCOPY/HOLMIUM LASER/STENT PLACEMENT;  Surgeon: Sondra Come, MD;  Location: [facility];  Service: Urology;  Laterality: Right;   HOLMIUM LASER APPLICATION Right 02/14/2016   Procedure: HOLMIUM LASER APPLICATION;  Surgeon: Hildred Laser, MD;  Location: [facility];  Service: Urology;  Laterality: Right;   URETEROSCOPY WITH HOLMIUM LASER LITHOTRIPSY Right 02/14/2016   Procedure: URETEROSCOPY WITH HOLMIUM LASER LITHOTRIPSY;  Surgeon: Hildred Laser, MD;  Location: [facility];  Service: Urology;  Laterality: Right;    Family History  Problem Relation Age of Onset   Stroke Mother    Seizures Mother    Dementia Mother    Cancer Mother        Cervical?   Nephrolithiasis Mother    Hematuria Mother    Depression Mother    Hypertension Father    Arthritis  Father    Heart disease Father    Depression Sister     Allergies  Allergen Reactions   Tramadol Nausea Only    Current Outpatient Medications on File Prior to Visit  Medication Sig Dispense Refill   acetaminophen (TYLENOL) 500 MG tablet Take 500 mg by mouth every 6 (six) hours as needed.     cetirizine (ZYRTEC) 10 MG tablet Take 10 mg by mouth daily.     DULoxetine (CYMBALTA) 20 MG capsule TAKE 1 CAPSULE(20 MG) BY MOUTH TWICE DAILY 180 capsule 0   famotidine (PEPCID) 10 MG tablet Take 10 mg by mouth daily.     L-Methylfolate 7.5 MG TABS TAKE 1 TABLET BY MOUTH EACH MORNING 30 tablet 11   lamoTRIgine (LAMICTAL) 25 MG tablet Take 1 tablet (25 mg total) by mouth 2 (two) times daily. 180  tablet 1   magnesium 30 MG tablet Take 30 mg by mouth at bedtime as needed.     Nutritional Supplements (LIPOTROPIC COMPLEX PO) Take by mouth. Takes as injectable     tirzepatide (ZEPBOUND) 10 MG/0.5ML Pen Inject 10 mg into the skin once a week.     traZODone (DESYREL) 50 MG tablet Take 0.5-1 tablets (25-50 mg total) by mouth at bedtime as needed for sleep. (Patient not taking: Reported on 12/05/2023) 90 tablet 0   No current facility-administered medications on file prior to visit.    BP 130/86   Pulse 85   Temp 97.7 F (36.5 C) (Temporal)   Ht 5\' 9"  (1.753 m)   Wt 215 lb (97.5 kg)   LMP 11/16/2023 (Approximate)   SpO2 100%   BMI 31.75 kg/m  Objective:   Physical Exam HENT:     Right Ear: Tympanic membrane and ear canal normal.     Left Ear: Tympanic membrane and ear canal normal.  Eyes:     Pupils: Pupils are equal, round, and reactive to light.  Cardiovascular:     Rate and Rhythm: Normal rate and regular rhythm.  Pulmonary:     Effort: Pulmonary effort is normal.     Breath sounds: Normal breath sounds.  Abdominal:     General: Bowel sounds are normal.     Palpations: Abdomen is soft.     Tenderness: There is no abdominal tenderness.  Musculoskeletal:        General: Normal range  of motion.     Cervical back: Neck supple.  Skin:    General: Skin is warm and dry.  Neurological:     Mental Status: She is alert and oriented to person, place, and time.     Cranial Nerves: No cranial nerve deficit.     Deep Tendon Reflexes:     Reflex Scores:      Patellar reflexes are 2+ on the right side and 2+ on the left side. Psychiatric:        Mood and Affect: Mood normal.           Assessment & Plan:  Preventative health care Assessment & Plan: Immunizations UTD. Declines influenza vaccine.  Pap smear due, she declines today but would like to establish with GYN. Mammogram due, orders placed.  Discussed the importance of a healthy diet and regular exercise in order for weight loss, and to reduce the risk of further co-morbidity.  Exam stable. Labs pending.  Follow up in 1 year for repeat physical.    Screening mammogram for breast cancer -     3D Screening Mammogram, Left and Right; Future  Encounter for gynecological examination -     Ambulatory referral to Obstetrics / Gynecology  Anxiety and depression Assessment & Plan: Controlled.  Following with psycihatry, office visit reviewed from December 2024.  Continue Lamictal 25 mg BID, Cymbalta 20 mg BID, Trazodone 50 mg PRN.    Chronic venous insufficiency Assessment & Plan: Improved!  Commended her on weight loss! Continue to monitor.    Class 1 obesity due to excess calories without serious comorbidity with body mass index (BMI) of 31.0 to 31.9 in adult Assessment & Plan: Commended her on weight loss!  Following with online provider. Continue Zepbound 10 mg weekly.   Encouraged exercise.   Orders: -     Hemoglobin A1c -     Lipid panel -     Comprehensive metabolic panel -     VITAMIN D  25 Hydroxy (Vit-D Deficiency, Fractures) -     TSH  Bipolar II disorder, mild, depressed, with mixed features, in full remission (HCC) Assessment & Plan: Controlled.  Following with psycihatry,  office visit reviewed from December 2024.  Continue Lamictal 25 mg BID, Cymbalta 20 mg BID, Trazodone 50 mg PRN.    Prediabetes Assessment & Plan: Commended her on weight loss!  Repeat A1C pending.  Orders: -     Hemoglobin A1c -     Lipid panel -     Comprehensive metabolic panel -     VITAMIN D 25 Hydroxy (Vit-D Deficiency, Fractures) -     TSH        [name], NP

## 2023-12-05 NOTE — Patient Instructions (Signed)
Stop by the lab prior to leaving today. I will notify you of your results once received.   [facility] to schedule your [facility].   You will either be contacted via phone regarding your referral to GYN, or you may receive a letter on your MyChart portal from our referral team with instructions for scheduling an appointment. Please let us know if you have not been contacted by anyone within two weeks.  It was a pleasure to see you today!

## 2023-12-05 NOTE — Assessment & Plan Note (Signed)
Improved!  Commended her on weight loss! Continue to monitor.

## 2023-12-05 NOTE — Assessment & Plan Note (Signed)
Commended her on weight loss!  Repeat A1C pending. 

## 2023-12-05 NOTE — Assessment & Plan Note (Signed)
Controlled.  Following with psycihatry, office visit reviewed from December 2024.  Continue Lamictal 25 mg BID, Cymbalta 20 mg BID, Trazodone 50 mg PRN.

## 2023-12-10 ENCOUNTER — Ambulatory Visit: Payer: 59

## 2023-12-21 NOTE — Progress Notes (Unsigned)
   Molly Miller T. Molly Rick, MD, CAQ Sports Medicine [facility] [address] [place], [address]  Phone: [phone]  FAX: [phone]  Molly Miller - 43 y.o. female  MRN [id number]  Date of Birth: 1981/01/30  Date: 12/22/2023  PCP: [name], NP  Referral: [name], NP  No chief complaint on file.  Subjective:   Molly Miller is a 43 y.o. very pleasant female patient with There is no height or weight on file to calculate BMI. who presents with the following:  She is having some issues with her ankle, skin, and foot.    Review of Systems is noted in the HPI, as appropriate  Objective:   LMP 11/16/2023 (Approximate)   GEN: No acute distress; alert,appropriate. PULM: Breathing comfortably in no respiratory distress PSYCH: Normally interactive.   Laboratory and Imaging Data:  Assessment and Plan:   ***

## 2023-12-22 ENCOUNTER — Encounter: Payer: Self-pay | Admitting: Family Medicine

## 2023-12-22 ENCOUNTER — Ambulatory Visit (INDEPENDENT_AMBULATORY_CARE_PROVIDER_SITE_OTHER): Payer: 59 | Admitting: Family Medicine

## 2023-12-22 VITALS — BP 120/74 | HR 88 | Temp 98.6°F | Ht 69.0 in | Wt 214.4 lb

## 2023-12-22 DIAGNOSIS — M21372 Foot drop, left foot: Secondary | ICD-10-CM | POA: Diagnosis not present

## 2023-12-22 DIAGNOSIS — G5732 Lesion of lateral popliteal nerve, left lower limb: Secondary | ICD-10-CM | POA: Diagnosis not present

## 2023-12-22 DIAGNOSIS — R200 Anesthesia of skin: Secondary | ICD-10-CM

## 2023-12-22 MED ORDER — DICLOFENAC SODIUM 75 MG PO TBEC: 75.0000 mg | DELAYED_RELEASE_TABLET | Freq: Two times a day (BID) | ORAL | 1 refills | Status: AC

## 2023-12-22 NOTE — Patient Instructions (Addendum)
 10 days of Diclofenac 75 mg twice a day  Work on your motion and strength form the handout that I gave to you.   If in 10 days, you are not better, please send a Mychart message to me, and then I would want to add some oral steroids.  If it does not get better after that, then I definitely want to know.

## 2024-02-05 ENCOUNTER — Telehealth (INDEPENDENT_AMBULATORY_CARE_PROVIDER_SITE_OTHER): Payer: Self-pay | Admitting: Psychiatry

## 2024-02-05 ENCOUNTER — Encounter: Payer: Self-pay | Admitting: Psychiatry

## 2024-02-05 DIAGNOSIS — Z634 Disappearance and death of family member: Secondary | ICD-10-CM | POA: Diagnosis not present

## 2024-02-05 DIAGNOSIS — F3176 Bipolar disorder, in full remission, most recent episode depressed: Secondary | ICD-10-CM

## 2024-02-05 DIAGNOSIS — G4701 Insomnia due to medical condition: Secondary | ICD-10-CM

## 2024-02-05 DIAGNOSIS — F411 Generalized anxiety disorder: Secondary | ICD-10-CM

## 2024-02-05 MED ORDER — DULOXETINE HCL 20 MG PO CPEP
ORAL_CAPSULE | ORAL | 1 refills | Status: DC
Start: 2024-02-05 — End: 2024-08-11

## 2024-02-05 NOTE — Progress Notes (Signed)
 Virtual Visit via Video Note  I connected with Molly Miller on 02/05/24 at  8:30 AM EDT by a video enabled telemedicine application and verified that I am speaking with the correct person using two identifiers.  Location Provider Location : [facility] Patient Location : Work  Participants: Patient , Provider    I discussed the limitations of evaluation and management by telemedicine and the availability of in person appointments. The patient expressed understanding and agreed to proceed.   I discussed the assessment and treatment plan with the patient. The patient was provided an opportunity to ask questions and all were answered. The patient agreed with the plan and demonstrated an understanding of the instructions.   The patient was advised to call back or seek an in-person evaluation if the symptoms worsen or if the condition fails to improve as anticipated.   BH MD OP Progress Note  02/05/2024 8:52 AM APREL EGELHOFF  MRN:  [id number]  Chief Complaint:  Chief Complaint  Patient presents with   Follow-up   Depression   Anxiety   Medication Refill   HPI: Molly Miller is a 43 year old Caucasian female, divorced, currently lives with her boyfriend in [place], has a history of bipolar disorder type II, GAD, insomnia, bereavement was evaluated by telemedicine today.  Bipolar disorder is currently stable with no recent episodes of mania, hypomania, or significant depression. She is managing her symptoms well and has not experienced severe anxiety recently.  Generalized anxiety disorder is stable with no severe anxiety episodes reported recently. She is managing well without therapy and is not experiencing paralyzing anxiety.  Regarding sleep issues, she has started using a magnesium butter with lavender on the bottoms of her feet, which has improved her sleep duration to four to five hours initially, with the ability to fall back asleep for an additional two hours. She has not  used trazodone recently as she is able to return to sleep after waking up.  She is coping with grief, noting that some days are harder than others, but she is working through her emotions and it is not paralyzing. She is not currently seeing a therapist but feels she is managing well.  She has lost 70 pounds since June of the previous year, attributing this to changes in her eating habits, including avoiding processed foods and seed oils, and focusing on low-fat and healthy carbohydrates. She is still taking Deplin (methylfolate) and notices a difference if she misses a dose.  She mentions a new concern about a hard swelling near her collarbone, which she discovered two days ago.  Visit Diagnosis:    ICD-10-CM   1. Bipolar disorder, in full remission, most recent episode depressed (HCC)  F31.76    Type 2    2. GAD (generalized anxiety disorder)  F41.1 DULoxetine (CYMBALTA) 20 MG capsule    3. Insomnia due to medical condition  G47.01    Lack of sleep hygiene, mood    4. Bereavement  Z63.4       Past Psychiatric History: I have reviewed past psychiatric history from progress note on 08/09/2020.  Past trials of venlafaxine.  Past Medical History:  Past Medical History:  Diagnosis Date   Abdominal bloating 10/16/2018   Acne    Acute left ankle pain 01/27/2019   Anxiety    Class 3 severe obesity due to excess calories with body mass index (BMI) of 40.0 to 44.9 in adult [facility]) 03/30/2015   COVID-19 virus infection 03/20/2023   Depression  Kidney stone    Pre-diabetes     Past Surgical History:  Procedure Laterality Date   CYSTOSCOPY W/ RETROGRADES Right 02/01/2022   Procedure: CYSTOSCOPY WITH RETROGRADE PYELOGRAM;  Surgeon: Sondra Come, MD;  Location: [facility];  Service: Urology;  Laterality: Right;   CYSTOSCOPY W/ URETERAL STENT PLACEMENT Right 02/14/2016   Procedure: CYSTOSCOPY WITH STENT EXCHANGE;  Surgeon: Hildred Laser, MD;  Location: [facility];  Service: Urology;   Laterality: Right;   CYSTOSCOPY WITH STENT PLACEMENT Right 01/14/2016   Procedure: RIGHT CYSTOSCOPY, RIGHT RETROGRADE, RIGHT URETERAL STENT PLACEMENT;  Surgeon: Crist Fat, MD;  Location: [facility];  Service: Urology;  Laterality: Right;   CYSTOSCOPY/URETEROSCOPY/HOLMIUM LASER/STENT PLACEMENT Right 01/16/2022   Procedure: CYSTOSCOPY/URETEROSCOPY/ RIGHT URETERAL STENT PLACEMENT;  Surgeon: Sondra Come, MD;  Location: [facility];  Service: Urology;  Laterality: Right;   CYSTOSCOPY/URETEROSCOPY/HOLMIUM LASER/STENT PLACEMENT Right 02/01/2022   Procedure: CYSTOSCOPY/URETEROSCOPY/HOLMIUM LASER/STENT PLACEMENT;  Surgeon: Sondra Come, MD;  Location: [facility];  Service: Urology;  Laterality: Right;   HOLMIUM LASER APPLICATION Right 02/14/2016   Procedure: HOLMIUM LASER APPLICATION;  Surgeon: Hildred Laser, MD;  Location: [facility];  Service: Urology;  Laterality: Right;   URETEROSCOPY WITH HOLMIUM LASER LITHOTRIPSY Right 02/14/2016   Procedure: URETEROSCOPY WITH HOLMIUM LASER LITHOTRIPSY;  Surgeon: Hildred Laser, MD;  Location: [facility];  Service: Urology;  Laterality: Right;    Family Psychiatric History: I have reviewed family psychiatric history from progress note on 08/09/2020.  Family History:  Family History  Problem Relation Age of Onset   Stroke Mother    Seizures Mother    Dementia Mother    Cancer Mother        Cervical?   Nephrolithiasis Mother    Hematuria Mother    Depression Mother    Hypertension Father    Arthritis Father    Heart disease Father    Depression Sister     Social History: I have reviewed social history from progress note on 08/09/2020. Social History   Socioeconomic History   Marital status: Divorced    Spouse name: Not on file   Number of children: Not on file   Years of education: Not on file   Highest education level: 12th grade  Occupational History   Not on file  Tobacco Use   Smoking status: Former    Current packs/day: 0.00     Types: Cigarettes    Quit date: 04/05/2020    Years since quitting: 3.8    Passive exposure: Past   Smokeless tobacco: Never   Tobacco comments:    june 2021  Vaping Use   Vaping status: Every Day   Substances: Nicotine   Devices: occa  Substance and Sexual Activity   Alcohol use: Yes    Alcohol/week: 0.0 standard drinks of alcohol    Comment: social   Drug use: No   Sexual activity: Not on file  Other Topics Concern   Not on file  Social History Narrative   Single   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      Social Drivers of Corporate investment banker Strain: Low Risk  (12/05/2023)   Overall Financial Resource Strain (CARDIA)    Difficulty of Paying Living Expenses: Not hard at all  Food Insecurity: No Food Insecurity (12/05/2023)   Hunger Vital Sign    Worried About Running Out of Food in the Last Year: Never true    Ran  Out of Food in the Last Year: Never true  Transportation Needs: No Transportation Needs (12/05/2023)   PRAPARE - Administrator, Civil Service (Medical): No    Lack of Transportation (Non-Medical): No  Physical Activity: Unknown (12/05/2023)   Exercise Vital Sign    Days of Exercise per Week: 0 days    Minutes of Exercise per Session: Not on file  Stress: No Stress Concern Present (12/05/2023)   [employer] of Occupational Health - Occupational Stress Questionnaire    Feeling of Stress : Only a little  Social Connections: Moderately Integrated (12/05/2023)   Social Connection and Isolation Panel [NHANES]    Frequency of Communication with Friends and Family: More than three times a week    Frequency of Social Gatherings with Friends and Family: Once a week    Attends Religious Services: More than 4 times per year    Active Member of [employer] or Organizations: No    Attends Engineer, structural: Not on file    Marital Status: Living with partner    Allergies:  Allergies  Allergen  Reactions   Tramadol Nausea Only    Metabolic Disorder Labs: Lab Results  Component Value Date   HGBA1C 5.5 12/05/2023   No results found for: "PROLACTIN" Lab Results  Component Value Date   CHOL 148 12/05/2023   TRIG 77.0 12/05/2023   HDL 38.40 (L) 12/05/2023   CHOLHDL 4 12/05/2023   VLDL 15.4 12/05/2023   LDLCALC 94 12/05/2023   Lab Results  Component Value Date   TSH 1.68 12/05/2023   TSH 1.70 08/21/2020    Therapeutic Level Labs: No results found for: "LITHIUM" No results found for: "VALPROATE" No results found for: "CBMZ"  Current Medications: Current Outpatient Medications  Medication Sig Dispense Refill   acetaminophen (TYLENOL) 500 MG tablet Take 500 mg by mouth every 6 (six) hours as needed.     cetirizine (ZYRTEC) 10 MG tablet Take 10 mg by mouth daily.     diclofenac (VOLTAREN) 75 MG EC tablet Take 1 tablet (75 mg total) by mouth 2 (two) times daily. 60 tablet 1   DULoxetine (CYMBALTA) 20 MG capsule TAKE 1 CAPSULE(20 MG) BY MOUTH TWICE DAILY 180 capsule 1   famotidine (PEPCID) 10 MG tablet Take 10 mg by mouth daily.     L-Methylfolate 7.5 MG TABS TAKE 1 TABLET BY MOUTH EACH MORNING 30 tablet 11   lamoTRIgine (LAMICTAL) 25 MG tablet Take 1 tablet (25 mg total) by mouth 2 (two) times daily. 180 tablet 1   magnesium 30 MG tablet Take 30 mg by mouth at bedtime as needed.     Nutritional Supplements (LIPOTROPIC COMPLEX PO) Take by mouth. Takes as injectable     tirzepatide (ZEPBOUND) 10 MG/0.5ML Pen Inject 10 mg into the skin once a week.     traZODone (DESYREL) 50 MG tablet Take 0.5-1 tablets (25-50 mg total) by mouth at bedtime as needed for sleep. 90 tablet 0   No current facility-administered medications for this visit.     Musculoskeletal: Strength & Muscle Tone:  UTA Gait & Station:  Seated Patient leans: N/A  Psychiatric Specialty Exam: Review of Systems  Psychiatric/Behavioral:  Positive for sleep disturbance.     There were no vitals taken for  this visit.There is no height or weight on file to calculate BMI.  General Appearance: Casual  Eye Contact:  Fair  Speech:  Clear and Coherent  Volume:  Normal  Mood:  Euthymic  Affect:  Appropriate  Thought Process:  Goal Directed and Descriptions of Associations: Intact  Orientation:  Full (Time, Place, and Person)  Thought Content: Logical   Suicidal Thoughts:  No  Homicidal Thoughts:  No  Memory:  Immediate;   Fair Recent;   Fair Remote;   Fair  Judgement:  Fair  Insight:  Fair  Psychomotor Activity:  Normal  Concentration:  Concentration: Fair and Attention Span: Fair  Recall:  [employer] of Knowledge: Fair  Language: Fair  Akathisia:  No  Handed:  Right  AIMS (if indicated): not done  Assets:  Desire for Improvement Housing Social Support  ADL's:  Intact  Cognition: WNL  Sleep:   improving   Screenings: AIMS    Flowsheet Row Video Visit from 08/15/2022 in [facility]  AIMS Total Score 0      GAD-7    Flowsheet Row Office Visit from 10/06/2023 in [facility] Video Visit from 10/23/2022 in [facility] Video Visit from 08/15/2022 in [facility] Video Visit from 03/07/2022 in [facility] Video Visit from 01/09/2022 in [facility]  Total GAD-7 Score 9 3 13 6 5       PHQ2-9    Flowsheet Row Office Visit from 12/05/2023 in [facility] [place] Office Visit from 10/06/2023 in [facility] Office Visit from 02/14/2023 in [facility] Video Visit from 10/23/2022 in [facility] Video Visit from 08/15/2022 in [facility]  PHQ-2 Total Score 0 2 2 1 2   PHQ-9  Total Score -- 7 9 -- 11      Flowsheet Row Video Visit from 02/05/2024 in [facility] Office Visit from 10/06/2023 in [facility] Video Visit from 06/05/2023 in [facility]  C-SSRS RISK CATEGORY No Risk No Risk No Risk        Assessment and Plan: ALGIE CALES is a 43 year old Caucasian female, employed, divorced, lives in [place], has a history of bipolar disorder, GAD was evaluated by telemedicine today.  Discussed assessment and plan as noted below.  Bipolar Disorder in remission Bipolar disorder in remission with no significant mania, hypomanic symptoms, or depression. Effective coping with grief using mindfulness strategies. No therapy required currently. - Continue Lamictal 25 mg twice daily - Continue Cymbalta 20 mg twice daily  Generalized Anxiety Disorder-stable Generalized anxiety disorder well-managed with no severe anxiety. Utilizing acceptance and mindfulness strategies effectively. - Continue Cymbalta as prescribed - Encourage ongoing use of mindfulness and acceptance strategies. - Continue Deplin 7.5 mg daily for folic acid conversion reduction.  Insomnia-improving Improved sleep with magnesium butter and lifestyle changes, achieving 6-7 hours per night. Able to fall back asleep within 10-15 minutes after waking. No trazodone needed. - Continue magnesium butter for sleep. - Continue Trazodone 25-50 mg at bedtime as needed  Bereavement-improving Currently coping with grief, loss of sister as well as uncle. - Continue coping strategies.   Follow-up - Follow-up in clinic on July 1 at 8:30 AM by video. - Patient advised to follow up with primary care provider for swelling noticed in the neck area.  Collaboration of Care: Collaboration of Care: Primary Care Provider AEB encouraged to follow up with primary care provider.  Patient/Guardian was  advised  Release of Information must be obtained prior to any record release in order to collaborate their care with an outside provider. Patient/Guardian was advised if they have not already done so to contact the registration department to sign all necessary forms in order for [place] to release information regarding their care.   Consent: Patient/Guardian gives verbal consent for treatment and assignment of benefits for services provided during this visit. Patient/Guardian expressed understanding and agreed to proceed.  Discussed the use of a AI scribe software for clinical note transcription with the patient, who gave verbal consent to proceed.  This note was generated in part or whole with voice recognition software. Voice recognition is usually quite accurate but there are transcription errors that can and very often do occur. I apologize for any typographical errors that were not detected and corrected.     Jomarie Longs, MD 02/05/2024, 8:52 AM

## 2024-02-06 ENCOUNTER — Ambulatory Visit: Admitting: Primary Care

## 2024-02-06 VITALS — BP 112/80 | HR 92 | Temp 98.2°F | Wt 202.0 lb

## 2024-02-06 DIAGNOSIS — H5789 Other specified disorders of eye and adnexa: Secondary | ICD-10-CM | POA: Diagnosis not present

## 2024-02-06 DIAGNOSIS — M7989 Other specified soft tissue disorders: Secondary | ICD-10-CM | POA: Diagnosis not present

## 2024-02-06 NOTE — Assessment & Plan Note (Signed)
 Could be corneal abrasion, no foreign body noted. No infection noted. Recommended she remove her contact, wear glasses for the next few days.  She will update.

## 2024-02-06 NOTE — Patient Instructions (Signed)
 You will receive a phone call regarding the ultrasound.  Remove your contacts today, wear glasses for the next few days.  It was a pleasure to see you today!

## 2024-02-06 NOTE — Progress Notes (Signed)
 Subjective:    Patient ID: Molly Miller, female    DOB: 1980/12/02, 43 y.o.   MRN: [id number]  HPI  Molly Miller is a very pleasant 43 y.o. female with a history of chronic venous insufficiency GAD, Bipolar II disorder, prediabetes, elevated blood pressure who presents today to discuss several concerns.  1) Skin Mass: Acute to the right medial clavicle for which she noticed a few days ago. Prior to symptom onset she had pulled off a few skin tags.   She had a cold about 1 month ago, no symptoms since. She denies pain, movement, tenderness, color changes, swelling, increased size, left sided mass.   2) Eye Irritation: Acute since this morning. She woke up this morning with dry eyes. Wears contacts, symptoms began once placing her right contact in her eye. She's replaced the right contact. She feels like something is inside, painful sensation inside to the upper medial lid. Also with clear drainage. She denies visual changes.    Review of Systems  HENT:  Negative for congestion and sore throat.   Eyes:  Positive for pain and redness. Negative for itching and visual disturbance.  Respiratory:  Negative for cough.   Hematological:  Negative for adenopathy.         Past Medical History:  Diagnosis Date   Abdominal bloating 10/16/2018   Acne    Acute left ankle pain 01/27/2019   Anxiety    Class 3 severe obesity due to excess calories with body mass index (BMI) of 40.0 to 44.9 in adult [facility]) 03/30/2015   COVID-19 virus infection 03/20/2023   Depression    Kidney stone    Pre-diabetes     Social History   Socioeconomic History   Marital status: Divorced    Spouse name: Not on file   Number of children: Not on file   Years of education: Not on file   Highest education level: 12th grade  Occupational History   Not on file  Tobacco Use   Smoking status: Former    Current packs/day: 0.00    Types: Cigarettes    Quit date: 04/05/2020    Years since quitting: 3.8     Passive exposure: Past   Smokeless tobacco: Never   Tobacco comments:    june 2021  Vaping Use   Vaping status: Every Day   Substances: Nicotine   Devices: occa  Substance and Sexual Activity   Alcohol use: Yes    Alcohol/week: 0.0 standard drinks of alcohol    Comment: social   Drug use: No   Sexual activity: Not on file  Other Topics Concern   Not on file  Social History Narrative   Single   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      Social Drivers of Corporate investment banker Strain: Low Risk  (12/05/2023)   Overall Financial Resource Strain (CARDIA)    Difficulty of Paying Living Expenses: Not hard at all  Food Insecurity: No Food Insecurity (12/05/2023)   Hunger Vital Sign    Worried About Running Out of Food in the Last Year: Never true    Ran Out of Food in the Last Year: Never true  Transportation Needs: No Transportation Needs (12/05/2023)   PRAPARE - Administrator, Civil Service (Medical): No    Lack of Transportation (Non-Medical): No  Physical Activity: Unknown (12/05/2023)   Exercise Vital Sign    Days of Exercise  per Week: 0 days    Minutes of Exercise per Session: Not on file  Stress: No Stress Concern Present (12/05/2023)   [employer] of Occupational Health - Occupational Stress Questionnaire    Feeling of Stress : Only a little  Social Connections: Moderately Integrated (12/05/2023)   Social Connection and Isolation Panel [NHANES]    Frequency of Communication with Friends and Family: More than three times a week    Frequency of Social Gatherings with Friends and Family: Once a week    Attends Religious Services: More than 4 times per year    Active Member of [employer] or Organizations: No    Attends Engineer, structural: Not on file    Marital Status: Living with partner  Intimate Partner Violence: Not on file    Past Surgical History:  Procedure Laterality Date   CYSTOSCOPY W/  RETROGRADES Right 02/01/2022   Procedure: CYSTOSCOPY WITH RETROGRADE PYELOGRAM;  Surgeon: Sondra Come, MD;  Location: [facility];  Service: Urology;  Laterality: Right;   CYSTOSCOPY W/ URETERAL STENT PLACEMENT Right 02/14/2016   Procedure: CYSTOSCOPY WITH STENT EXCHANGE;  Surgeon: Hildred Laser, MD;  Location: [facility];  Service: Urology;  Laterality: Right;   CYSTOSCOPY WITH STENT PLACEMENT Right 01/14/2016   Procedure: RIGHT CYSTOSCOPY, RIGHT RETROGRADE, RIGHT URETERAL STENT PLACEMENT;  Surgeon: Crist Fat, MD;  Location: [facility];  Service: Urology;  Laterality: Right;   CYSTOSCOPY/URETEROSCOPY/HOLMIUM LASER/STENT PLACEMENT Right 01/16/2022   Procedure: CYSTOSCOPY/URETEROSCOPY/ RIGHT URETERAL STENT PLACEMENT;  Surgeon: Sondra Come, MD;  Location: [facility];  Service: Urology;  Laterality: Right;   CYSTOSCOPY/URETEROSCOPY/HOLMIUM LASER/STENT PLACEMENT Right 02/01/2022   Procedure: CYSTOSCOPY/URETEROSCOPY/HOLMIUM LASER/STENT PLACEMENT;  Surgeon: Sondra Come, MD;  Location: [facility];  Service: Urology;  Laterality: Right;   HOLMIUM LASER APPLICATION Right 02/14/2016   Procedure: HOLMIUM LASER APPLICATION;  Surgeon: Hildred Laser, MD;  Location: [facility];  Service: Urology;  Laterality: Right;   URETEROSCOPY WITH HOLMIUM LASER LITHOTRIPSY Right 02/14/2016   Procedure: URETEROSCOPY WITH HOLMIUM LASER LITHOTRIPSY;  Surgeon: Hildred Laser, MD;  Location: [facility];  Service: Urology;  Laterality: Right;    Family History  Problem Relation Age of Onset   Stroke Mother    Seizures Mother    Dementia Mother    Cancer Mother        Cervical?   Nephrolithiasis Mother    Hematuria Mother    Depression Mother    Hypertension Father    Arthritis Father    Heart disease Father    Depression Sister     Allergies  Allergen Reactions   Tramadol Nausea Only    Current Outpatient Medications on File Prior to Visit  Medication Sig Dispense Refill   acetaminophen  (TYLENOL) 500 MG tablet Take 500 mg by mouth every 6 (six) hours as needed.     cetirizine (ZYRTEC) 10 MG tablet Take 10 mg by mouth daily.     diclofenac (VOLTAREN) 75 MG EC tablet Take 1 tablet (75 mg total) by mouth 2 (two) times daily. 60 tablet 1   DULoxetine (CYMBALTA) 20 MG capsule TAKE 1 CAPSULE(20 MG) BY MOUTH TWICE DAILY 180 capsule 1   famotidine (PEPCID) 10 MG tablet Take 10 mg by mouth daily.     L-Methylfolate 7.5 MG TABS TAKE 1 TABLET BY MOUTH EACH MORNING 30 tablet 11   lamoTRIgine (LAMICTAL) 25 MG tablet Take 1 tablet (25 mg total) by mouth 2 (two) times daily. 180 tablet 1   magnesium  30 MG tablet Take 30 mg by mouth at bedtime as needed.     Nutritional Supplements (LIPOTROPIC COMPLEX PO) Take by mouth. Takes as injectable     tirzepatide (ZEPBOUND) 10 MG/0.5ML Pen Inject 10 mg into the skin once a week.     traZODone (DESYREL) 50 MG tablet Take 0.5-1 tablets (25-50 mg total) by mouth at bedtime as needed for sleep. 90 tablet 0   No current facility-administered medications on file prior to visit.    BP 112/80 (BP Location: Right Arm, Patient Position: Sitting, Cuff Size: Large)   Pulse 92   Temp 98.2 F (36.8 C) (Oral)   Wt 202 lb (91.6 kg)   SpO2 97%   BMI 29.83 kg/m  Objective:   Physical Exam Eyes:     General:        Right eye: No discharge.        Left eye: No discharge.     Conjunctiva/sclera:     Right eye: Right conjunctiva is injected. No exudate. Cardiovascular:     Rate and Rhythm: Normal rate.  Pulmonary:     Effort: Pulmonary effort is normal.  Chest:       Comments: Mild localized swelling to right medial clavicle  Lymphadenopathy:     Cervical: No cervical adenopathy.     Upper Body:     Right upper body: Supraclavicular adenopathy present.  Skin:    General: Skin is warm and dry.  Neurological:     Mental Status: She is alert.           Assessment & Plan:  Swelling of clavicular region Assessment & Plan: Exam today mostly  representative of lymph node, discussed with patient today. Will obtain soft tissue US to evaluate. She agrees.   Orders placed.   Orders: -     US SOFT TISSUE HEAD & NECK (NON-THYROID); Future  Irritation of right eye Assessment & Plan: Could be corneal abrasion, no foreign body noted. No infection noted. Recommended she remove her contact, wear glasses for the next few days.  She will update.         [name], NP

## 2024-02-06 NOTE — Assessment & Plan Note (Signed)
 Exam today mostly representative of lymph node, discussed with patient today. Will obtain soft tissue US to evaluate. She agrees.   Orders placed.

## 2024-02-10 ENCOUNTER — Other Ambulatory Visit

## 2024-02-16 ENCOUNTER — Ambulatory Visit
Admission: RE | Admit: 2024-02-16 | Discharge: 2024-02-16 | Disposition: A | Discharge status: Home / Self Care | Source: Ambulatory Visit | Attending: Primary Care | Admitting: Primary Care

## 2024-02-16 DIAGNOSIS — M7989 Other specified soft tissue disorders: Secondary | ICD-10-CM

## 2024-02-25 NOTE — Telephone Encounter (Signed)
 Reached out to radiologist they will have sent for read as soon as possible.

## 2024-03-07 NOTE — Progress Notes (Unsigned)
     Obi Scrima T. Kieth Hartis, MD, CAQ Sports Medicine [facility] at [facility] [address] [place], [address]  Phone: [phone]  FAX: [phone]  Molly Miller - 43 y.o. female  MRN [id number]  Date of Birth: Mar 12, 1981  Date: 03/08/2024  PCP: Gabriel John, NP  Referral: Gabriel John, NP  No chief complaint on file.  Subjective:   Molly Miller is a 43 y.o. very pleasant female patient with There is no height or weight on file to calculate BMI. who presents with the following:  Patient presents for evaluation of some mid clavicular enlargement.  She did have a soft tissue ultrasound on February 25, 2024, and this was normal.    Review of Systems is noted in the HPI, as appropriate  Objective:   There were no vitals taken for this visit.  GEN: No acute distress; alert,appropriate. PULM: Breathing comfortably in no respiratory distress PSYCH: Normally interactive.   Laboratory and Imaging Data:  Assessment and Plan:   ***

## 2024-03-08 ENCOUNTER — Ambulatory Visit (INDEPENDENT_AMBULATORY_CARE_PROVIDER_SITE_OTHER)
Admission: RE | Admit: 2024-03-08 | Discharge: 2024-03-08 | Disposition: A | Discharge status: Home / Self Care | Source: Ambulatory Visit | Attending: Family Medicine | Admitting: Family Medicine

## 2024-03-08 ENCOUNTER — Ambulatory Visit (INDEPENDENT_AMBULATORY_CARE_PROVIDER_SITE_OTHER): Admitting: Family Medicine

## 2024-03-08 ENCOUNTER — Encounter: Payer: Self-pay | Admitting: Family Medicine

## 2024-03-08 VITALS — BP 124/68 | HR 78 | Temp 98.1°F | Ht 69.0 in | Wt 196.0 lb

## 2024-03-08 DIAGNOSIS — M7989 Other specified soft tissue disorders: Secondary | ICD-10-CM

## 2024-03-08 DIAGNOSIS — M7541 Impingement syndrome of right shoulder: Secondary | ICD-10-CM | POA: Diagnosis not present

## 2024-03-17 ENCOUNTER — Ambulatory Visit: Payer: Self-pay | Admitting: Family Medicine

## 2024-04-06 ENCOUNTER — Other Ambulatory Visit: Payer: Self-pay | Admitting: Psychiatry

## 2024-04-06 DIAGNOSIS — F411 Generalized anxiety disorder: Secondary | ICD-10-CM

## 2024-04-07 ENCOUNTER — Other Ambulatory Visit: Payer: Self-pay | Admitting: Psychiatry

## 2024-04-07 DIAGNOSIS — F411 Generalized anxiety disorder: Secondary | ICD-10-CM

## 2024-04-07 NOTE — Telephone Encounter (Signed)
 Called pharmacy spoke to Raina she stated that the patient has 2 remaining refills for the Cymbalta  and will get it refilled today called patient ot inform

## 2024-04-28 ENCOUNTER — Other Ambulatory Visit: Payer: Self-pay | Admitting: Psychiatry

## 2024-04-28 DIAGNOSIS — F411 Generalized anxiety disorder: Secondary | ICD-10-CM

## 2024-05-04 ENCOUNTER — Telehealth (INDEPENDENT_AMBULATORY_CARE_PROVIDER_SITE_OTHER): Payer: Self-pay | Admitting: Psychiatry

## 2024-05-04 ENCOUNTER — Encounter: Payer: Self-pay | Admitting: Psychiatry

## 2024-05-04 DIAGNOSIS — G4701 Insomnia due to medical condition: Secondary | ICD-10-CM | POA: Diagnosis not present

## 2024-05-04 DIAGNOSIS — F411 Generalized anxiety disorder: Secondary | ICD-10-CM | POA: Diagnosis not present

## 2024-05-04 DIAGNOSIS — Z634 Disappearance and death of family member: Secondary | ICD-10-CM | POA: Diagnosis not present

## 2024-05-04 DIAGNOSIS — F3176 Bipolar disorder, in full remission, most recent episode depressed: Secondary | ICD-10-CM

## 2024-05-04 NOTE — Progress Notes (Unsigned)
 Virtual Visit via Video Note  I connected with Molly Miller on 05/04/24 at  8:30 AM EDT by a video enabled telemedicine application and verified that I am speaking with the correct person using two identifiers.  Location Provider Location : [facility] Patient Location : Work  Participants: Patient , Provider    I discussed the limitations of evaluation and management by telemedicine and the availability of in person appointments. The patient expressed understanding and agreed to proceed.    I discussed the assessment and treatment plan with the patient. The patient was provided an opportunity to ask questions and all were answered. The patient agreed with the plan and demonstrated an understanding of the instructions.   The patient was advised to call back or seek an in-person evaluation if the symptoms worsen or if the condition fails to improve as anticipated.  BH MD OP Progress Note  05/04/2024 9:00 AM Molly Miller  MRN:  [id number]  Chief Complaint:  Chief Complaint  Patient presents with   Follow-up   Anxiety   Depression   Medication Refill   Discussed the use of AI scribe software for clinical note transcription with the patient, who gave verbal consent to proceed.  History of Present Illness Molly Miller Molly Miller is a 43 year old Caucasian female, divorced, currently lives with her boyfriend in [place], has a history of bipolar disorder type II, GAD, insomnia, bereavement was evaluated by telemedicine today.  She is currently taking Lamictal , Cymbalta , L-methylfolate, and trazodone  as needed, and finds these medications effective. She notices a difference if she misses a dose.  She currently denies any significant anxiety or depression symptoms.  She has started using magnesium, including a topical form called 'magnesium butter,' applied to the bottoms of her feet to aid sleep. She achieves five to six hours of sleep per night and has not needed to take tramadol   recently as the magnesium has been effective.  Denies thoughts of self-harm or harm to others. Her appetite is good, and she feels she is managing work stress adequately. She describes having 'bad moments, not bad days,' indicating stable mood management.  She has recently started taking an NAD supplement for collagen and energy, as well as turmeric at night.   Socially, she has started attending church and is working on her spiritual well-being.  Denies any other concerns today.  Visit Diagnosis:    ICD-10-CM   1. Bipolar disorder, in full remission, most recent episode depressed (HCC)  F31.76    Type II    2. GAD (generalized anxiety disorder)  F41.1     3. Insomnia due to medical condition  G47.01    Lack of sleep hygiene, mood    4. Bereavement  Z63.4       Past Psychiatric History: I have reviewed past psychiatric history from progress note on 08/09/2020.  Past trials of venlafaxine .  Past Medical History:  Past Medical History:  Diagnosis Date   Abdominal bloating 10/16/2018   Acne    Acute left ankle pain 01/27/2019   Anxiety    Class 3 severe obesity due to excess calories with body mass index (BMI) of 40.0 to 44.9 in adult 03/30/2015   COVID-19 virus infection 03/20/2023   Depression    Kidney stone    Pre-diabetes     Past Surgical History:  Procedure Laterality Date   CYSTOSCOPY W/ RETROGRADES Right 02/01/2022   Procedure: CYSTOSCOPY WITH RETROGRADE PYELOGRAM;  Surgeon: Francisca Redell BROCKS, MD;  Location:  [facility];  Service: Urology;  Laterality: Right;   CYSTOSCOPY W/ URETERAL STENT PLACEMENT Right 02/14/2016   Procedure: CYSTOSCOPY WITH STENT EXCHANGE;  Surgeon: Redell Lynwood Napoleon, MD;  Location: [facility];  Service: Urology;  Laterality: Right;   CYSTOSCOPY WITH STENT PLACEMENT Right 01/14/2016   Procedure: RIGHT CYSTOSCOPY, RIGHT RETROGRADE, RIGHT URETERAL STENT PLACEMENT;  Surgeon: Morene LELON Salines, MD;  Location: [facility];  Service: Urology;  Laterality:  Right;   CYSTOSCOPY/URETEROSCOPY/HOLMIUM LASER/STENT PLACEMENT Right 01/16/2022   Procedure: CYSTOSCOPY/URETEROSCOPY/ RIGHT URETERAL STENT PLACEMENT;  Surgeon: Francisca Redell BROCKS, MD;  Location: [facility];  Service: Urology;  Laterality: Right;   CYSTOSCOPY/URETEROSCOPY/HOLMIUM LASER/STENT PLACEMENT Right 02/01/2022   Procedure: CYSTOSCOPY/URETEROSCOPY/HOLMIUM LASER/STENT PLACEMENT;  Surgeon: Francisca Redell BROCKS, MD;  Location: [facility];  Service: Urology;  Laterality: Right;   HOLMIUM LASER APPLICATION Right 02/14/2016   Procedure: HOLMIUM LASER APPLICATION;  Surgeon: Redell Lynwood Napoleon, MD;  Location: [facility];  Service: Urology;  Laterality: Right;   URETEROSCOPY WITH HOLMIUM LASER LITHOTRIPSY Right 02/14/2016   Procedure: URETEROSCOPY WITH HOLMIUM LASER LITHOTRIPSY;  Surgeon: Redell Lynwood Napoleon, MD;  Location: [facility];  Service: Urology;  Laterality: Right;    Family Psychiatric History: I have reviewed family psychiatric history from progress note on 08/09/2020.  Family History:  Family History  Problem Relation Age of Onset   Stroke Mother    Seizures Mother    Dementia Mother    Cancer Mother        Cervical?   Nephrolithiasis Mother    Hematuria Mother    Depression Mother    Hypertension Father    Arthritis Father    Heart disease Father    Depression Sister     Social History: I have reviewed social history from progress note on 08/09/2020. Social History   Socioeconomic History   Marital status: Divorced    Spouse name: Not on file   Number of children: Not on file   Years of education: Not on file   Highest education level: 12th grade  Occupational History   Not on file  Tobacco Use   Smoking status: Former    Current packs/day: 0.00    Types: Cigarettes    Quit date: 04/05/2020    Years since quitting: 4.0    Passive exposure: Past   Smokeless tobacco: Never   Tobacco comments:    june 2021  Vaping Use   Vaping status: Every Day   Substances: Nicotine   Devices:  occa  Substance and Sexual Activity   Alcohol use: Yes    Alcohol/week: 0.0 standard drinks of alcohol    Comment: social   Drug use: No   Sexual activity: Not on file  Other Topics Concern   Not on file  Social History Narrative   Single   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      Social Drivers of Corporate investment banker Strain: Low Risk  (12/05/2023)   Overall Financial Resource Strain (CARDIA)    Difficulty of Paying Living Expenses: Not hard at all  Food Insecurity: No Food Insecurity (12/05/2023)   Hunger Vital Sign    Worried About Running Out of Food in the Last Year: Never true    Ran Out of Food in the Last Year: Never true  Transportation Needs: No Transportation Needs (12/05/2023)   PRAPARE - Administrator, Civil Service (Medical): No    Lack of Transportation (Non-Medical): No  Physical  Activity: Unknown (12/05/2023)   Exercise Vital Sign    Days of Exercise per Week: 0 days    Minutes of Exercise per Session: Not on file  Stress: No Stress Concern Present (12/05/2023)   [employer] of Occupational Health - Occupational Stress Questionnaire    Feeling of Stress : Only a little  Social Connections: Moderately Integrated (12/05/2023)   Social Connection and Isolation Panel    Frequency of Communication with Friends and Family: More than three times a week    Frequency of Social Gatherings with Friends and Family: Once a week    Attends Religious Services: More than 4 times per year    Active Member of [employer] or Organizations: No    Attends Engineer, structural: Not on file    Marital Status: Living with partner    Allergies:  Allergies  Allergen Reactions   Tramadol  Nausea Only    Metabolic Disorder Labs: Lab Results  Component Value Date   HGBA1C 5.5 12/05/2023   No results found for: PROLACTIN Lab Results  Component Value Date   CHOL 148 12/05/2023   TRIG 77.0 12/05/2023    HDL 38.40 (L) 12/05/2023   CHOLHDL 4 12/05/2023   VLDL 15.4 12/05/2023   LDLCALC 94 12/05/2023   Lab Results  Component Value Date   TSH 1.68 12/05/2023   TSH 1.70 08/21/2020    Therapeutic Level Labs: No results found for: LITHIUM No results found for: VALPROATE No results found for: CBMZ  Current Medications: Current Outpatient Medications  Medication Sig Dispense Refill   acetaminophen  (TYLENOL ) 500 MG tablet Take 500 mg by mouth every 6 (six) hours as needed.     cetirizine (ZYRTEC) 10 MG tablet Take 10 mg by mouth daily.     diclofenac  (VOLTAREN ) 75 MG EC tablet Take 1 tablet (75 mg total) by mouth 2 (two) times daily. 60 tablet 1   DULoxetine  (CYMBALTA ) 20 MG capsule TAKE 1 CAPSULE(20 MG) BY MOUTH TWICE DAILY 180 capsule 1   famotidine  (PEPCID ) 10 MG tablet Take 10 mg by mouth daily.     L-Methylfolate 7.5 MG TABS TAKE 1 TABLET BY MOUTH EACH MORNING 30 tablet 11   lamoTRIgine  (LAMICTAL ) 25 MG tablet TAKE 1 TABLET(25 MG) BY MOUTH TWICE DAILY 180 tablet 1   magnesium 30 MG tablet Take 30 mg by mouth at bedtime as needed.     Nutritional Supplements (LIPOTROPIC COMPLEX PO) Take by mouth. Takes as injectable     tirzepatide (ZEPBOUND) 10 MG/0.5ML Pen Inject 10 mg into the skin once a week.     traZODone  (DESYREL ) 50 MG tablet Take 0.5-1 tablets (25-50 mg total) by mouth at bedtime as needed for sleep. 90 tablet 0   No current facility-administered medications for this visit.     Musculoskeletal: Strength & Muscle Tone: UTA Gait & Station: Seated Patient leans: N/A  Psychiatric Specialty Exam: Review of Systems  Psychiatric/Behavioral:  Positive for sleep disturbance.     There were no vitals taken for this visit.There is no height or weight on file to calculate BMI.  General Appearance: Casual  Eye Contact:  Fair  Speech:  Clear and Coherent  Volume:  Normal  Mood:  Euthymic  Affect:  Congruent  Thought Process:  Goal Directed and Descriptions of  Associations: Intact  Orientation:  Full (Time, Place, and Person)  Thought Content: Logical   Suicidal Thoughts:  No  Homicidal Thoughts:  No  Memory:  Immediate;   Fair Recent;  Fair Remote;   Fair  Judgement:  Fair  Insight:  Fair  Psychomotor Activity:  Normal  Concentration:  Concentration: Fair and Attention Span: Fair  Recall:  [employer] of Knowledge: Fair  Language: Fair  Akathisia:  No  Handed:  Right  AIMS (if indicated): not done  Assets:  Communication Skills Desire for Improvement Housing Social Support Transportation  ADL's:  Intact  Cognition: WNL  Sleep:  improved   Screenings: AIMS    Flowsheet Row Video Visit from 08/15/2022 in [facility]  AIMS Total Score 0   GAD-7    Flowsheet Row Office Visit from 10/06/2023 in [facility] Video Visit from 10/23/2022 in [facility] Video Visit from 08/15/2022 in [facility] Video Visit from 03/07/2022 in [facility] Video Visit from 01/09/2022 in [facility]  Total GAD-7 Score 9 3 13 6 5    PHQ2-9    Flowsheet Row Office Visit from 02/06/2024 in [facility] Office Visit from 12/05/2023 in [facility] [place] Office Visit from 10/06/2023 in [facility] Office Visit from 02/14/2023 in [facility] Video Visit from 10/23/2022 in [facility]  PHQ-2 Total Score 0 0 2 2 1   PHQ-9 Total Score -- -- 7 9 --   Flowsheet Row Video Visit from 05/04/2024 in [facility] Video Visit from 02/05/2024 in [facility] Office Visit from 10/06/2023 in [facility]  C-SSRS RISK CATEGORY No Risk No Risk No Risk     Assessment and Plan: MILANIE ROSENFIELD is a 43 year old Caucasian female, employed, divorced, lives in [place], has a history of bipolar disorder, GAD was evaluated by telemedicine today.  Discussed assessment and plan as noted below.  Bipolar disorder in remission Currently denies any significant mood swings, current medication regimen is effective. Continue Lamictal  25 mg twice daily Continue Cymbalta  20 mg twice daily.  Generalized anxiety disorder-stable Denies any significant anxiety symptoms. Continue Cymbalta  as prescribed Continue Deplin 7.5 mg daily for folic acid conversion reduction.  Insomnia-improving Currently well managed with improved sleep on the current medication regimen. Continue magnesium as needed for sleep Continue Trazodone  25 to 50 mg at bedtime as needed Continue sleep hygiene techniques.  Bereavement-improving Currently reports she is coping well. Continue coping strategies.  Follow-up Follow-up in clinic in 3 months or sooner in person.   Consent: Patient/Guardian gives verbal consent for treatment and assignment of benefits for services provided during this visit. Patient/Guardian expressed understanding and agreed to proceed.   This note was generated in part or whole with voice recognition software. Voice recognition is usually quite accurate but there are transcription errors that can and very often do occur. I apologize for any typographical errors that were not detected and corrected.    Tanazia Achee, MD 05/05/2024, 8:16 AM

## 2024-05-05 DIAGNOSIS — F3176 Bipolar disorder, in full remission, most recent episode depressed: Secondary | ICD-10-CM | POA: Insufficient documentation

## 2024-05-24 ENCOUNTER — Encounter (HOSPITAL_COMMUNITY): Payer: Self-pay

## 2024-05-24 ENCOUNTER — Other Ambulatory Visit: Payer: Self-pay

## 2024-05-24 ENCOUNTER — Emergency Department (HOSPITAL_COMMUNITY)

## 2024-05-24 ENCOUNTER — Emergency Department (HOSPITAL_COMMUNITY)
Admission: EM | Admit: 2024-05-24 | Discharge: 2024-05-24 | Disposition: A | Discharge status: Home / Self Care | Attending: Emergency Medicine | Admitting: Emergency Medicine

## 2024-05-24 DIAGNOSIS — R112 Nausea with vomiting, unspecified: Secondary | ICD-10-CM | POA: Insufficient documentation

## 2024-05-24 DIAGNOSIS — R1013 Epigastric pain: Secondary | ICD-10-CM | POA: Diagnosis present

## 2024-05-24 DIAGNOSIS — D72829 Elevated white blood cell count, unspecified: Secondary | ICD-10-CM | POA: Diagnosis not present

## 2024-05-24 DIAGNOSIS — E876 Hypokalemia: Secondary | ICD-10-CM | POA: Diagnosis not present

## 2024-05-24 DIAGNOSIS — N83202 Unspecified ovarian cyst, left side: Secondary | ICD-10-CM | POA: Diagnosis not present

## 2024-05-24 LAB — COMPREHENSIVE METABOLIC PANEL WITH GFR
ALT: 11 U/L (ref 0–44)
AST: 17 U/L (ref 15–41)
Albumin: 3.9 g/dL (ref 3.5–5.0)
Alkaline Phosphatase: 53 U/L (ref 38–126)
Anion gap: 13 (ref 5–15)
BUN: 15 mg/dL (ref 6–20)
CO2: 19 mmol/L — ABNORMAL LOW (ref 22–32)
Calcium: 8.9 mg/dL (ref 8.9–10.3)
Chloride: 105 mmol/L (ref 98–111)
Creatinine, Ser: 0.59 mg/dL (ref 0.44–1.00)
GFR, Estimated: >60 mL/min (ref >60)
Glucose, Bld: 148 mg/dL — ABNORMAL HIGH (ref 70–99)
Potassium: 3.3 mmol/L — ABNORMAL LOW (ref 3.5–5.1)
Sodium: 137 mmol/L (ref 135–145)
Total Bilirubin: 0.7 mg/dL (ref 0.0–1.2)
Total Protein: 6.7 g/dL (ref 6.5–8.1)

## 2024-05-24 LAB — CBC
HCT: 40.1 % (ref 36.0–46.0)
Hemoglobin: 13.3 g/dL (ref 12.0–15.0)
MCH: 27 pg (ref 26.0–34.0)
MCHC: 33.2 g/dL (ref 30.0–36.0)
MCV: 81.3 fL (ref 80.0–100.0)
Platelets: 242 K/uL (ref 150–400)
RBC: 4.93 MIL/uL (ref 3.87–5.11)
RDW: 13.3 % (ref 11.5–15.5)
WBC: 12 K/uL — ABNORMAL HIGH (ref 4.0–10.5)
nRBC: 0 % (ref 0.0–0.2)

## 2024-05-24 LAB — TROPONIN I (HIGH SENSITIVITY)
Troponin I (High Sensitivity): <2 ng/L (ref <18)
Troponin I (High Sensitivity): <2 ng/L (ref <18)

## 2024-05-24 LAB — LIPASE, BLOOD: Lipase: 40 U/L (ref 11–51)

## 2024-05-24 LAB — HCG, SERUM, QUALITATIVE: Preg, Serum: NEGATIVE

## 2024-05-24 MED ORDER — ONDANSETRON HCL 4 MG/2ML IJ SOLN
4.0000 mg | Freq: Once | INTRAMUSCULAR | Status: AC
  Administered 2024-05-24: 4 mg via INTRAVENOUS
  Filled 2024-05-24: qty 2

## 2024-05-24 MED ORDER — MORPHINE SULFATE (PF) 4 MG/ML IV SOLN
4.0000 mg | Freq: Once | INTRAVENOUS | Status: AC
  Administered 2024-05-24: 4 mg via INTRAVENOUS
  Filled 2024-05-24: qty 1

## 2024-05-24 MED ORDER — METOCLOPRAMIDE HCL 5 MG/ML IJ SOLN
10.0000 mg | Freq: Once | INTRAMUSCULAR | Status: AC
  Administered 2024-05-24: 10 mg via INTRAVENOUS
  Filled 2024-05-24: qty 2

## 2024-05-24 MED ORDER — IOHEXOL 350 MG/ML SOLN
75.0000 mL | Freq: Once | INTRAVENOUS | Status: AC | PRN
  Administered 2024-05-24: 75 mL via INTRAVENOUS

## 2024-05-24 MED ORDER — SODIUM CHLORIDE 0.9 % IV BOLUS
1000.0000 mL | Freq: Once | INTRAVENOUS | Status: AC
  Administered 2024-05-24: 1000 mL via INTRAVENOUS

## 2024-05-24 MED ORDER — SUCRALFATE 1 G PO TABS: 1.0000 g | ORAL_TABLET | Freq: Three times a day (TID) | ORAL | 0 refills | Status: AC

## 2024-05-24 MED ORDER — PANTOPRAZOLE SODIUM 40 MG IV SOLR
40.0000 mg | Freq: Once | INTRAVENOUS | Status: AC
  Administered 2024-05-24: 40 mg via INTRAVENOUS
  Filled 2024-05-24: qty 10

## 2024-05-24 MED ORDER — PANTOPRAZOLE SODIUM 20 MG PO TBEC: 20.0000 mg | DELAYED_RELEASE_TABLET | Freq: Every day | ORAL | 0 refills | Status: AC

## 2024-05-24 MED ORDER — ONDANSETRON HCL 4 MG PO TABS: 4.0000 mg | ORAL_TABLET | Freq: Four times a day (QID) | ORAL | 0 refills | Status: AC

## 2024-05-24 MED ORDER — POTASSIUM CHLORIDE CRYS ER 20 MEQ PO TBCR
40.0000 meq | EXTENDED_RELEASE_TABLET | Freq: Once | ORAL | Status: DC
  Filled 2024-05-24: qty 2

## 2024-05-24 NOTE — Discharge Instructions (Addendum)
 Also, please follow-up with your primary care physician within the next 2 weeks to have your labs reevaluated to ensure that your potassium is returned to normal.

## 2024-05-24 NOTE — ED Provider Notes (Signed)
 [place] EMERGENCY DEPARTMENT AT [facility] Provider Note   CSN: [id number] Arrival date & time: 05/24/24  0345     Patient presents with: Abdominal Pain   Molly Miller is a 43 y.o. female.   43 year old female with history of kidney stones, depression, anxiety, presents with complaint of epigastric pain onset 10:30pm last night. Patient has tried mustard and gas-ex without improvement in her symptoms. Pain is worse with movement, better with rest. Worse with taking a deep breath. Does not radiate. Is associated with nausea, vomiting, diaphoresis. No fevers, no prior abdominal surgeries.        Prior to Admission medications   Medication Sig Start Date End Date Taking? Authorizing Provider  acetaminophen  (TYLENOL ) 500 MG tablet Take 500 mg by mouth every 6 (six) hours as needed.    [provider]  cetirizine (ZYRTEC) 10 MG tablet Take 10 mg by mouth daily.    [provider]  diclofenac  (VOLTAREN ) 75 MG EC tablet Take 1 tablet (75 mg total) by mouth 2 (two) times daily. 12/22/23   Copland, Jacques, MD  DULoxetine  (CYMBALTA ) 20 MG capsule TAKE 1 CAPSULE(20 MG) BY MOUTH TWICE DAILY 02/05/24   Eappen, Saramma, MD  famotidine  (PEPCID ) 10 MG tablet Take 10 mg by mouth daily.    [provider]  L-Methylfolate 7.5 MG TABS TAKE 1 TABLET BY MOUTH EACH MORNING 08/05/23   Eappen, Saramma, MD  lamoTRIgine  (LAMICTAL ) 25 MG tablet TAKE 1 TABLET(25 MG) BY MOUTH TWICE DAILY 04/28/24   Eappen, Saramma, MD  magnesium 30 MG tablet Take 30 mg by mouth at bedtime as needed.    [provider]  Nutritional Supplements (LIPOTROPIC COMPLEX PO) Take by mouth. Takes as injectable    [provider]  tirzepatide (ZEPBOUND) 10 MG/0.5ML Pen Inject 10 mg into the skin once a week.    [provider]  traZODone  (DESYREL ) 50 MG tablet Take 0.5-1 tablets (25-50 mg total) by mouth at bedtime as needed for sleep. 01/09/22   Eappen, Saramma, MD     Allergies: Tramadol     Review of Systems Negative except as per HPI Updated Vital Signs BP (!) 130/95   Pulse (!) 46   Temp 98.2 F (36.8 C) (Oral)   Resp 19   Ht 5' 9 (1.753 m)   Wt 82.1 kg   SpO2 100%   BMI 26.73 kg/m   Physical Exam Vitals and nursing note reviewed.  Constitutional:      General: She is not in acute distress.    Appearance: She is well-developed. She is not diaphoretic.     Comments: Appears uncomfortable   HENT:     Head: Normocephalic and atraumatic.  Cardiovascular:     Rate and Rhythm: Normal rate and regular rhythm.     Pulses: Normal pulses.     Heart sounds: Normal heart sounds.  Pulmonary:     Effort: Pulmonary effort is normal.     Breath sounds: Normal breath sounds.  Abdominal:     Palpations: Abdomen is soft.     Tenderness: There is abdominal tenderness in the right upper quadrant and epigastric area.  Musculoskeletal:     Right lower leg: No edema.     Left lower leg: No edema.  Skin:    General: Skin is warm and dry.  Neurological:     Mental Status: She is alert and oriented to person, place, and time.  Psychiatric:  Behavior: Behavior normal.     (all labs ordered are listed, but only abnormal results are displayed) Labs Reviewed  COMPREHENSIVE METABOLIC PANEL WITH GFR - Abnormal; Notable for the following components:      Result Value   Potassium 3.3 (*)    CO2 19 (*)    Glucose, Bld 148 (*)    All other components within normal limits  CBC - Abnormal; Notable for the following components:   WBC 12.0 (*)    All other components within normal limits  LIPASE, BLOOD  HCG, SERUM, QUALITATIVE  URINALYSIS, ROUTINE W REFLEX MICROSCOPIC  TROPONIN I (HIGH SENSITIVITY)  TROPONIN I (HIGH SENSITIVITY)    EKG: EKG Interpretation Date/Time:  Monday May 24 2024 04:15:57 EDT Ventricular Rate:  51 PR Interval:  170 QRS Duration:  108 QT Interval:  417 QTC Calculation: 384 R Axis:   85  Text  Interpretation: Ectopic atrial rhythm Low voltage, precordial leads No previous tracing Confirmed by Haze Lonni PARAS [phone]) on 05/24/2024 4:20:25 AM  Radiology: CT ABDOMEN PELVIS W CONTRAST Result Date: 05/24/2024 CLINICAL DATA:  43 year old female with abdominal pain and nausea. EXAM: CT ABDOMEN AND PELVIS WITH CONTRAST TECHNIQUE: Multidetector CT imaging of the abdomen and pelvis was performed using the standard protocol following bolus administration of intravenous contrast. RADIATION DOSE REDUCTION: This exam was performed according to the departmental dose-optimization program which includes automated exposure control, adjustment of the mA and/or kV according to patient size and/or use of iterative reconstruction technique. CONTRAST:  75mL OMNIPAQUE  IOHEXOL  350 MG/ML SOLN COMPARISON:  Noncontrast CT Abdomen and Pelvis 01/14/2022. FINDINGS: Lower chest: Symmetric dependent atelectasis at both lung bases, otherwise negative. No pericardial or pleural effusion. Hepatobiliary: Liver and gallbladder within normal limits. No convincing biliary ductal dilatation. Pancreas: Negative. Spleen: Negative.  Small splenule, normal variant. Adrenals/Urinary Tract: Normal adrenal glands. Asymmetric right renal cortical scarring (series 3, image 26) and chronic right upper pole nephrolithiasis. But regressed right renal stone disease since 2023. No hydronephrosis. Diminutive ureters. Punctate left nephrolithiasis. Symmetric renal enhancement. Pelvic phleboliths. No convincing ureteral calculus. Unremarkable bladder. Stomach/Bowel: Negative large bowel aside from mild redundancy and retained stool. Diminutive and normal appendix tracking medial from the cecum on coronal image 55. Nondilated small bowel. Some flocculated small bowel contents incidentally noted. Stomach and duodenum appear negative. No pneumoperitoneum. No free fluid. No mesenteric inflammation identified. Vascular/Lymphatic: Major arterial structures  in the abdomen and pelvis are patent. There is minimal right iliac calcified atherosclerosis. Early portal venous timing, main portal venous system appears to be patent. No lymphadenopathy. Reproductive: Oval 4 cm left ovarian cyst with mildly complex fluid density. This is probably physiologic, such as a hemorrhagic cyst. Otherwise negative uterus and right ovary. Other: No pelvis free fluid. Musculoskeletal: No acute osseous abnormality identified. Chronic disc and endplate degeneration at T11-T12. IMPRESSION: 1. Chronic nephrolithiasis and right renal cortical scarring, but stone disease has regressed since 2023 and there is no acute obstructive uropathy. 2. No acute or inflammatory process identified in the abdomen. Normal appendix. 3. Oval 4 cm left ovarian cyst with mildly complex fluid density, although not specific this is most likely a physiologic hemorrhagic cyst. No pelvis free fluid. Electronically Signed   By: VEAR Hurst M.D.   On: 05/24/2024 06:18     Procedures   Medications Ordered in the ED  potassium chloride  SA (KLOR-CON  M) CR tablet 40 mEq (has no administration in time range)  metoCLOPramide  (REGLAN ) injection 10 mg (has no administration in time range)  morphine  (PF) 4 MG/ML injection 4 mg (has no administration in time range)  ondansetron  (ZOFRAN ) injection 4 mg (4 mg Intravenous Given 05/24/24 0431)  sodium chloride  0.9 % bolus 1,000 mL (1,000 mLs Intravenous New Bag/Given 05/24/24 0436)  morphine  (PF) 4 MG/ML injection 4 mg (4 mg Intravenous Given 05/24/24 0432)  iohexol  (OMNIPAQUE ) 350 MG/ML injection 75 mL (75 mLs Intravenous Contrast Given 05/24/24 0541)                                    Medical Decision Making Amount and/or Complexity of Data Reviewed Labs: ordered.   This patient presents to the ED for concern of epigastric pain, this involves an extensive number of treatment options, and is a complaint that carries with it a high risk of complications and morbidity.   The differential diagnosis includes pancreatitis, peptic ulcer disease, GERD, ACS, acute cholecystitis   Co morbidities / Chronic conditions that complicate the patient evaluation  As listed per HPI   Additional history obtained:  Additional history obtained from EMR External records from outside source obtained and reviewed including prior labs on file.    Lab Tests:  I Ordered, and personally interpreted labs.  The pertinent results include:  initial trop negative, delta pending at time of signout to oncoming provider. CBC with mild leukocytosis at 12.0. CMP without significant findings, mild hypokalemia. Lipase WNL. Hcg negative.    Imaging Studies ordered:  I ordered imaging studies including CT a/p  I independently visualized and interpreted imaging which showed no acute process, normal appendix I agree with the radiologist interpretation   Cardiac Monitoring: / EKG:  The patient was maintained on a cardiac monitor.  I personally viewed and interpreted the cardiac monitored which showed an underlying rhythm of: ectopic atrial complex, rate 51.   Problem List / ED Course / Critical interventions / Medication management  43 year old female presents with complaint of epigastric pain, worse with movement and deep breaths, not improved with mustard and Gas-X.  CT without acute findings to explain patient's pain, labs with normal LFTs, lipase, bili.  Mild leukocytosis, nonspecific.  Initial troponin is negative.  Pending second troponin and additional meds for symptom relief.  Care signed out at change of shift. I ordered medication including morphine , zofran    Reevaluation of the patient after these medicines showed that the patient reports initially some improvement in her pain and nausea however her pain is starting to come back and is nauseous again.  Will order additional morphine  and Reglan . I have reviewed the patients home medicines and have made adjustments as  needed   Social Determinants of Health:  Lives at home, has PCP   Test / Admission - Considered:  Pending at time of sign out to oncoming provider.       Final diagnoses:  Epigastric pain  Nausea and vomiting, unspecified vomiting type  Cyst of left ovary  Hypokalemia    ED Discharge Orders     None          Beverley Leita LABOR, PA-C 05/24/24 0631    Haze Lonni PARAS, MD 05/25/24 [phone]

## 2024-05-24 NOTE — ED Triage Notes (Signed)
 Pt complaining of squeezing epigastric pain that is not relieved with position change. Pt is pt having dark colored emesis. Pt is reporting nausea that started 3 hours ago.

## 2024-05-24 NOTE — ED Provider Notes (Signed)
  Physical Exam  BP (!) 130/95   Pulse (!) 46   Temp 98.2 F (36.8 C) (Oral)   Resp 19   Ht 5' 9 (1.753 m)   Wt 82.1 kg   SpO2 100%   BMI 26.73 kg/m   Physical Exam Vitals and nursing note reviewed.  Constitutional:      General: She is not in acute distress.    Appearance: She is well-developed.  HENT:     Head: Normocephalic and atraumatic.  Eyes:     Conjunctiva/sclera: Conjunctivae normal.  Cardiovascular:     Rate and Rhythm: Normal rate and regular rhythm.     Heart sounds: No murmur heard. Pulmonary:     Effort: Pulmonary effort is normal. No respiratory distress.     Breath sounds: Normal breath sounds.  Abdominal:     General: Bowel sounds are normal.     Palpations: Abdomen is soft.     Tenderness: There is abdominal tenderness in the right upper quadrant and epigastric area.  Musculoskeletal:        General: No swelling.     Cervical back: Neck supple.  Skin:    General: Skin is warm and dry.     Capillary Refill: Capillary refill takes less than 2 seconds.  Neurological:     Mental Status: She is alert.  Psychiatric:        Mood and Affect: Mood normal.     Procedures  Procedures  ED Course / MDM    Medical Decision Making Amount and/or Complexity of Data Reviewed Labs: ordered. Radiology: ordered.  Risk Prescription drug management.   Care handed off by L.Beverley, PA-C.  Medical Decision Making:   Molly Miller is a 43 y.o. female who presented to the ED today with epigastric pain detailed previous provider note.     At time of transfer of care, patient had return of nausea and pain and was managed with another dose of morphine  and Reglan .  Plan at this time was likely GI follow-up and discharged on PPI.  Disposition pending reassessment.   Reassessment and Plan:   After assessing the patient, she continues to have abdominal discomfort however states that previous medications have helped in resolving this.  She still has tenderness to  the right upper quadrant and epigastric regions, though bowel sounds are present and normal in all 4 quadrants.  Review of objective data obtained prior to handoff shows that liver enzymes are within normal limits as is lipase.  Mild leukocytosis of 12 is noted, troponins are within normal limits, as well as having a unremarkable CT of the abdomen pelvis along with unremarkable EKG.  As such, we will follow with plan to discharge patient with outpatient course of pantoprazole  along with sucralfate , will also provide outpatient course of ondansetron  to manage intermittent nausea.  Referral to be given for GI follow-up.         Molly Miller, [place] 05/24/24 [id number]    Molly Lamar BROCKS, MD 05/29/24 [phone]

## 2024-05-24 NOTE — ED Notes (Signed)
 Patient transported to CT

## 2024-05-24 NOTE — ED Notes (Signed)
 Potassium held due to nausea

## 2024-05-24 NOTE — ED Notes (Addendum)
 Lab contacted to add troponin

## 2024-05-26 ENCOUNTER — Encounter: Payer: Self-pay | Admitting: Gastroenterology

## 2024-07-02 ENCOUNTER — Other Ambulatory Visit: Payer: Self-pay | Admitting: Psychiatry

## 2024-07-02 DIAGNOSIS — F411 Generalized anxiety disorder: Secondary | ICD-10-CM

## 2024-07-16 ENCOUNTER — Ambulatory Visit: Admitting: Gastroenterology

## 2024-08-11 ENCOUNTER — Telehealth: Admitting: Psychiatry

## 2024-08-11 ENCOUNTER — Encounter: Payer: Self-pay | Admitting: Psychiatry

## 2024-08-11 DIAGNOSIS — G4701 Insomnia due to medical condition: Secondary | ICD-10-CM | POA: Diagnosis not present

## 2024-08-11 DIAGNOSIS — F3176 Bipolar disorder, in full remission, most recent episode depressed: Secondary | ICD-10-CM

## 2024-08-11 DIAGNOSIS — Z634 Disappearance and death of family member: Secondary | ICD-10-CM

## 2024-08-11 DIAGNOSIS — F411 Generalized anxiety disorder: Secondary | ICD-10-CM

## 2024-08-11 MED ORDER — DULOXETINE HCL 20 MG PO CPEP: ORAL_CAPSULE | ORAL | 3 refills | Status: AC

## 2024-08-11 NOTE — Progress Notes (Signed)
 Virtual Visit via Video Note  I connected with Molly Miller on 08/11/24 at  8:30 AM EDT by a video enabled telemedicine application and verified that I am speaking with the correct person using two identifiers.  Location Provider Location : [facility] Patient Location : Work  Participants: Patient , Provider    I discussed the limitations of evaluation and management by telemedicine and the availability of in person appointments. The patient expressed understanding and agreed to proceed.   I discussed the assessment and treatment plan with the patient. The patient was provided an opportunity to ask questions and all were answered. The patient agreed with the plan and demonstrated an understanding of the instructions.   The patient was advised to call back or seek an in-person evaluation if the symptoms worsen or if the condition fails to improve as anticipated.   BH MD OP Progress Note  08/11/2024 10:58 AM TRINKA KESHISHYAN  MRN:  [id number]  Chief Complaint:  Chief Complaint  Patient presents with   Follow-up   Anxiety   Depression   Medication Refill   Discussed the use of AI scribe software for clinical note transcription with the patient, who gave verbal consent to proceed.  History of Present Illness Molly Miller Molly Miller) is a 43 year old Caucasian female, divorced, currently lives with her boyfriend in [place], has a history of bipolar disorder type II, GAD, insomnia, bereavement was evaluated by telemedicine today.  Since her last visit, she reports doing well overall, noting improved motivation and increased engagement in physical activity, including weightlifting and regular exercise. She credits her improved energy and mood to her current medication regimen and expresses satisfaction with her medications, stating that she notices an immediate difference if she misses a dose.  She continues to experience persistent fatigue, which she relates to a busy lifestyle,  late nights, and early mornings. She reports averaging 4 to 5 hours of sleep per night and acknowledges the need to improve her sleep hygiene. She continues to find balancing rest, exercise, and nutrition an ongoing challenge, especially as she adjusts to recent weight loss and increased physical activity.  She confirms continued use of her medications, including Cymbalta , trazodone  as needed for sleep, and methylfolate (Duoplant). She also reports taking Zepound (on a maintenance dose) for weight management and recognizes that fatigue may be a side effect of this medication. She states that she continues to take her medications as prescribed and feels they are effective for her.  She denies any thoughts of hurting herself or others.   Visit Diagnosis:    ICD-10-CM   1. Bipolar disorder, in full remission, most recent episode depressed  F31.76     2. GAD (generalized anxiety disorder)  F41.1 DULoxetine  (CYMBALTA ) 20 MG capsule    3. Insomnia due to medical condition  G47.01    Mood symptoms, lack of sleep hygiene    4. Bereavement  Z63.4       Past Psychiatric History: I have reviewed past psychiatric history from progress note on 08/09/2020.  Past trials of venlafaxine .  Past Medical History:  Past Medical History:  Diagnosis Date   Abdominal bloating 10/16/2018   Acne    Acute left ankle pain 01/27/2019   Anxiety    Class 3 severe obesity due to excess calories with body mass index (BMI) of 40.0 to 44.9 in adult [facility]) 03/30/2015   COVID-19 virus infection 03/20/2023   Depression    Kidney stone    Pre-diabetes  Past Surgical History:  Procedure Laterality Date   CYSTOSCOPY W/ RETROGRADES Right 02/01/2022   Procedure: CYSTOSCOPY WITH RETROGRADE PYELOGRAM;  Surgeon: Francisca Redell BROCKS, MD;  Location: [facility];  Service: Urology;  Laterality: Right;   CYSTOSCOPY W/ URETERAL STENT PLACEMENT Right 02/14/2016   Procedure: CYSTOSCOPY WITH STENT EXCHANGE;  Surgeon: Redell Lynwood Napoleon,  MD;  Location: [facility];  Service: Urology;  Laterality: Right;   CYSTOSCOPY WITH STENT PLACEMENT Right 01/14/2016   Procedure: RIGHT CYSTOSCOPY, RIGHT RETROGRADE, RIGHT URETERAL STENT PLACEMENT;  Surgeon: Morene LELON Salines, MD;  Location: [facility];  Service: Urology;  Laterality: Right;   CYSTOSCOPY/URETEROSCOPY/HOLMIUM LASER/STENT PLACEMENT Right 01/16/2022   Procedure: CYSTOSCOPY/URETEROSCOPY/ RIGHT URETERAL STENT PLACEMENT;  Surgeon: Francisca Redell BROCKS, MD;  Location: [facility];  Service: Urology;  Laterality: Right;   CYSTOSCOPY/URETEROSCOPY/HOLMIUM LASER/STENT PLACEMENT Right 02/01/2022   Procedure: CYSTOSCOPY/URETEROSCOPY/HOLMIUM LASER/STENT PLACEMENT;  Surgeon: Francisca Redell BROCKS, MD;  Location: [facility];  Service: Urology;  Laterality: Right;   HOLMIUM LASER APPLICATION Right 02/14/2016   Procedure: HOLMIUM LASER APPLICATION;  Surgeon: Redell Lynwood Napoleon, MD;  Location: [facility];  Service: Urology;  Laterality: Right;   URETEROSCOPY WITH HOLMIUM LASER LITHOTRIPSY Right 02/14/2016   Procedure: URETEROSCOPY WITH HOLMIUM LASER LITHOTRIPSY;  Surgeon: Redell Lynwood Napoleon, MD;  Location: [facility];  Service: Urology;  Laterality: Right;    Family Psychiatric History: I have reviewed family psychiatric history from progress note on 08/09/2020.  Family History:  Family History  Problem Relation Age of Onset   Stroke Mother    Seizures Mother    Dementia Mother    Cancer Mother        Cervical?   Nephrolithiasis Mother    Hematuria Mother    Depression Mother    Hypertension Father    Arthritis Father    Heart disease Father    Depression Sister     Social History: Reviewed social history from progress note on 08/09/2020. Social History   Socioeconomic History   Marital status: Divorced    Spouse name: Not on file   Number of children: Not on file   Years of education: Not on file   Highest education level: 12th grade  Occupational History   Not on file  Tobacco Use   Smoking status:  Former    Current packs/day: 0.00    Types: Cigarettes    Quit date: 04/05/2020    Years since quitting: 4.3    Passive exposure: Past   Smokeless tobacco: Never   Tobacco comments:    june 2021  Vaping Use   Vaping status: Every Day   Substances: Nicotine   Devices: occa  Substance and Sexual Activity   Alcohol use: Yes    Alcohol/week: 0.0 standard drinks of alcohol    Comment: social   Drug use: No   Sexual activity: Not on file  Other Topics Concern   Not on file  Social History Narrative   Single   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      Social Drivers of Corporate investment banker Strain: Low Risk  (05/26/2024)   [facility]   Overall Financial Resource Strain (CARDIA)    How hard is it for you to pay for the very basics like food, housing, medical care, and heating?: Not hard at all  Food Insecurity: No Food Insecurity (05/26/2024)   Received from [facility]   Hunger Vital Sign    Within the  past 12 months, you worried that your food would run out before you got the money to buy more.: Never true    Within the past 12 months, the food you bought just didn't last and you didn't have money to get more.: Never true  Transportation Needs: No Transportation Needs (05/26/2024)   Received from [facility] - Transportation    In the past 12 months, has lack of transportation kept you from medical appointments or from getting medications?: No    In the past 12 months, has lack of transportation kept you from meetings, work, or from getting things needed for daily living?: No  Physical Activity: Inactive (05/26/2024)   Received from [facility]   Exercise Vital Sign    On average, how many days per week do you engage in moderate to strenuous exercise (like a brisk walk)?: 0 days    Minutes of Exercise per Session: Not on file  Stress: Stress Concern Present (05/26/2024)   Received from [facility] - Occupational Stress Questionnaire    Do you feel stress - tense, restless, nervous, or anxious, or unable to sleep at night because your mind is troubled all the time - these days?: To some extent  Social Connections: Socially Integrated (05/26/2024)   Received from [facility]   Social Network    How would you rate your social network (family, work, friends)?: Good participation with social networks    Allergies:  Allergies  Allergen Reactions   Tramadol  Nausea Only    Metabolic Disorder Labs: Lab Results  Component Value Date   HGBA1C 5.5 12/05/2023   No results found for: PROLACTIN Lab Results  Component Value Date   CHOL 148 12/05/2023   TRIG 77.0 12/05/2023   HDL 38.40 (L) 12/05/2023   CHOLHDL 4 12/05/2023   VLDL 15.4 12/05/2023   LDLCALC 94 12/05/2023   Lab Results  Component Value Date   TSH 1.68 12/05/2023   TSH 1.70 08/21/2020    Therapeutic Level Labs: No results found for: LITHIUM No results found for: VALPROATE No results found for: CBMZ  Current Medications: Current Outpatient Medications  Medication Sig Dispense Refill   acetaminophen  (TYLENOL ) 500 MG tablet Take 500 mg by mouth every 6 (six) hours as needed.     cetirizine (ZYRTEC) 10 MG tablet Take 10 mg by mouth daily.     diclofenac  (VOLTAREN ) 75 MG EC tablet Take 1 tablet (75 mg total) by mouth 2 (two) times daily. 60 tablet 1   DULoxetine  (CYMBALTA ) 20 MG capsule TAKE 1 CAPSULE(20 MG) BY MOUTH TWICE DAILY 180 capsule 3   famotidine  (PEPCID ) 10 MG tablet Take 10 mg by mouth daily.     L-Methylfolate 7.5 MG TABS TAKE 1 TABLET BY MOUTH EACH MORNING 30 tablet 11   lamoTRIgine  (LAMICTAL ) 25 MG tablet TAKE 1 TABLET(25 MG) BY MOUTH TWICE DAILY 180 tablet 1   magnesium 30 MG tablet Take 30 mg by mouth at bedtime as needed.     Nutritional Supplements (LIPOTROPIC COMPLEX PO) Take by mouth. Takes as injectable     ondansetron  (ZOFRAN ) 4 MG  tablet Take 1 tablet (4 mg total) by mouth every 6 (six) hours. 12 tablet 0   pantoprazole  (PROTONIX ) 20 MG tablet Take 1 tablet (20 mg total) by mouth daily. 30 tablet 0   sucralfate  (CARAFATE ) 1 g tablet Take 1 tablet (1 g total) by mouth 4 (four) times daily -  with  meals and at bedtime. 120 tablet 0   tirzepatide (ZEPBOUND) 10 MG/0.5ML Pen Inject 10 mg into the skin once a week.     traZODone  (DESYREL ) 50 MG tablet Take 0.5-1 tablets (25-50 mg total) by mouth at bedtime as needed for sleep. 90 tablet 0   No current facility-administered medications for this visit.     Musculoskeletal: Strength & Muscle Tone: UTA Gait & Station: Seated Patient leans: N/A  Psychiatric Specialty Exam: Review of Systems  Psychiatric/Behavioral:  Positive for sleep disturbance.     There were no vitals taken for this visit.There is no height or weight on file to calculate BMI.  General Appearance: Casual  Eye Contact:  Fair  Speech:  Clear and Coherent  Volume:  Normal  Mood:  Euthymic  Affect:  Appropriate  Thought Process:  Goal Directed and Descriptions of Associations: Intact  Orientation:  Full (Time, Place, and Person)  Thought Content: Logical   Suicidal Thoughts:  No  Homicidal Thoughts:  No  Memory:  Immediate;   Fair Recent;   Fair Remote;   Fair  Judgement:  Fair  Insight:  Fair  Psychomotor Activity:  Normal  Concentration:  Concentration: Fair and Attention Span: Fair  Recall:  [employer] of Knowledge: Fair  Language: Fair  Akathisia:  No  Handed:  Right  AIMS (if indicated): not done  Assets:  Communication Skills Desire for Improvement Housing Intimacy Leisure Time Social Support  ADL's:  Intact  Cognition: WNL  Sleep:  Improving   Screenings: AIMS    Flowsheet Row Video Visit from 08/15/2022 in [facility]  AIMS Total Score 0   GAD-7    Flowsheet Row Office Visit from 10/06/2023 in [facility] Video Visit from 10/23/2022 in [facility] Video Visit from 08/15/2022 in [facility] Video Visit from 03/07/2022 in [facility] Video Visit from 01/09/2022 in [facility]  Total GAD-7 Score 9 3 13 6 5    PHQ2-9    Flowsheet Row Office Visit from 02/06/2024 in [facility] [place] Office Visit from 12/05/2023 in [facility] [place] Office Visit from 10/06/2023 in [facility] Office Visit from 02/14/2023 in [facility] Video Visit from 10/23/2022 in [facility]  PHQ-2 Total Score 0 0 2 2 1   PHQ-9 Total Score -- -- 7 9 --   Flowsheet Row Video Visit from 08/11/2024 in [facility] ED from 05/24/2024 in [facility] Video Visit from 05/04/2024 in [facility]  C-SSRS RISK CATEGORY No Risk No Risk No Risk     Assessment and Plan: MAKAELYN APONTE is a 43 year old Caucasian female, employed, divorced was evaluated by telemedicine today.  Discussed assessment and plan as noted below.  1. Bipolar disorder, in full remission, most recent episode depressed Currently denies any significant depression or anxiety or manic or hypomanic symptoms. Continue Lamictal  25 mg twice daily  2. GAD (generalized anxiety disorder)-stable Currently anxiety is managed on current medication regimen. Continue Cymbalta  20 mg twice daily Continue Deplin 7.5 mg daily for folic acid conversion reduction.  3. Insomnia due to medical condition-improving Sleep has improved although continues to sleep only 5 hours at night.  Denies  any snoring or apneic episodes  although continues to feel tired during the day which likely multifactorial.  Not interested in any further medication changes or referral for sleep study at this time. Continue Trazodone  25 to 50 mg at bedtime as needed Continue Magnesium over-the-counter as needed for sleep Continue sleep hygiene techniques  4. Bereavement-stable Currently coping well.  Follow-up Follow-up in clinic in 3 to 4 months or sooner in person.   Consent: Patient/Guardian gives verbal consent for treatment and assignment of benefits for services provided during this visit. Patient/Guardian expressed understanding and agreed to proceed.   This note was generated in part or whole with voice recognition software. Voice recognition is usually quite accurate but there are transcription errors that can and very often do occur. I apologize for any typographical errors that were not detected and corrected.    Orlan Aversa, MD 08/11/2024, 10:58 AM

## 2024-09-04 ENCOUNTER — Other Ambulatory Visit: Payer: Self-pay | Admitting: Psychiatry

## 2024-09-04 DIAGNOSIS — F411 Generalized anxiety disorder: Secondary | ICD-10-CM

## 2024-09-26 ENCOUNTER — Other Ambulatory Visit: Payer: Self-pay | Admitting: Psychiatry

## 2024-09-26 DIAGNOSIS — F411 Generalized anxiety disorder: Secondary | ICD-10-CM

## 2024-10-09 ENCOUNTER — Other Ambulatory Visit: Payer: Self-pay | Admitting: Psychiatry

## 2024-10-09 DIAGNOSIS — F411 Generalized anxiety disorder: Secondary | ICD-10-CM

## 2024-10-20 ENCOUNTER — Ambulatory Visit: Admitting: Primary Care

## 2024-10-29 ENCOUNTER — Other Ambulatory Visit: Payer: Self-pay | Admitting: Psychiatry

## 2024-10-29 DIAGNOSIS — F411 Generalized anxiety disorder: Secondary | ICD-10-CM

## 2024-11-10 ENCOUNTER — Ambulatory Visit: Admitting: Primary Care

## 2024-11-10 ENCOUNTER — Ambulatory Visit: Payer: Self-pay | Admitting: Primary Care

## 2024-11-10 ENCOUNTER — Encounter: Payer: Self-pay | Admitting: Primary Care

## 2024-11-10 VITALS — BP 116/78 | HR 57 | Temp 98.0°F | Ht 69.0 in | Wt 169.1 lb

## 2024-11-10 DIAGNOSIS — E611 Iron deficiency: Secondary | ICD-10-CM

## 2024-11-10 DIAGNOSIS — R5383 Other fatigue: Secondary | ICD-10-CM

## 2024-11-10 LAB — IBC + FERRITIN
Ferritin: 4.5 ng/mL — ABNORMAL LOW (ref 10.0–291.0)
Iron: 46 ug/dL (ref 42–145)
Saturation Ratios: 12.5 % — ABNORMAL LOW (ref 20.0–50.0)
TIBC: 368.2 ug/dL (ref 250.0–450.0)
Transferrin: 263 mg/dL (ref 212.0–360.0)

## 2024-11-10 LAB — COMPREHENSIVE METABOLIC PANEL WITH GFR
ALT: 10 U/L (ref 3–35)
AST: 12 U/L (ref 5–37)
Albumin: 4.4 g/dL (ref 3.5–5.2)
Alkaline Phosphatase: 54 U/L (ref 39–117)
BUN: 23 mg/dL (ref 6–23)
CO2: 30 meq/L (ref 19–32)
Calcium: 9.4 mg/dL (ref 8.4–10.5)
Chloride: 104 meq/L (ref 96–112)
Creatinine, Ser: 0.71 mg/dL (ref 0.40–1.20)
GFR: 103.89 mL/min
Glucose, Bld: 55 mg/dL — ABNORMAL LOW (ref 70–99)
Potassium: 4 meq/L (ref 3.5–5.1)
Sodium: 140 meq/L (ref 135–145)
Total Bilirubin: 0.6 mg/dL (ref 0.2–1.2)
Total Protein: 6.3 g/dL (ref 6.0–8.3)

## 2024-11-10 LAB — CBC
HCT: 39.6 % (ref 36.0–46.0)
Hemoglobin: 13.1 g/dL (ref 12.0–15.0)
MCHC: 33.2 g/dL (ref 30.0–36.0)
MCV: 82.4 fl (ref 78.0–100.0)
Platelets: 208 K/uL (ref 150.0–400.0)
RBC: 4.81 Mil/uL (ref 3.87–5.11)
RDW: 13.9 % (ref 11.5–15.5)
WBC: 3.6 K/uL — ABNORMAL LOW (ref 4.0–10.5)

## 2024-11-10 LAB — VITAMIN B12: Vitamin B-12: 570 pg/mL (ref 211–911)

## 2024-11-10 LAB — TSH: TSH: 1.71 u[IU]/mL (ref 0.35–5.50)

## 2024-11-10 LAB — VITAMIN D 25 HYDROXY (VIT D DEFICIENCY, FRACTURES): VITD: 30.42 ng/mL (ref 30.00–100.00)

## 2024-11-10 NOTE — Assessment & Plan Note (Signed)
 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 Commended her on weight loss and exercise.  Labs pending today pending TSH, CBC, iron studies, CMP, vitamin B12, vitamin D .  Continue duloxetine  per psychiatry.

## 2024-11-10 NOTE — Patient Instructions (Signed)
 Stop by the lab prior to leaving today. I will notify you of your results once received.   It was a pleasure to see you today!

## 2024-11-10 NOTE — Progress Notes (Signed)
 "  Subjective:    Patient ID: Molly Miller, female    DOB: 1981/01/04, 44 y.o.   MRN: [id number]  Molly Miller is a very pleasant 44 y.o. female with a history of anxiety depression, insomnia, prediabetes who presents today to discuss fatigue.  Symptoms began about 3-4 weeks ago with fatigue. She wakes up feeling fatigued like I haven't slept. I run out of gas in the afternoon.   She's lost > 100 pounds over the last year on Zepbound. She is now at her goal weight. She questions if her electrolytes are off balance. She tries to eat about 100 g of protein daily which does help. She denies snoring. She's under a lot of stress due to her partner's chronic illnesses as she is the primary caregiver.  She will be leaving for a cruise in 2 weeks with family and friends and is looking forward to this.  This will give her a break from caregiving.  She denies rectal bleeding, menorrhagia, palpitations, shortness of breath.   Wt Readings from Last 3 Encounters:  11/10/24 169 lb 2 oz (76.7 kg)  05/24/24 181 lb (82.1 kg)  03/08/24 196 lb (88.9 kg)      Review of Systems  Constitutional:  Positive for fatigue.  Respiratory:  Negative for shortness of breath.   Cardiovascular:  Negative for palpitations.  Gastrointestinal:  Negative for abdominal pain and blood in stool.  Genitourinary:  Negative for menstrual problem.  Neurological:  Negative for dizziness.         Past Medical History:  Diagnosis Date   Abdominal bloating 10/16/2018   Acne    Acute left ankle pain 01/27/2019   Anxiety    Class 3 severe obesity due to excess calories with body mass index (BMI) of 40.0 to 44.9 in adult [facility]) 03/30/2015   COVID-19 virus infection 03/20/2023   Depression    Kidney stone    Pre-diabetes     Social History   Socioeconomic History   Marital status: Divorced    Spouse name: Not on file   Number of children: Not on file   Years of education: Not on file   Highest education  level: 12th grade  Occupational History   Not on file  Tobacco Use   Smoking status: Former    Current packs/day: 0.00    Types: Cigarettes    Quit date: 04/05/2020    Years since quitting: 4.6    Passive exposure: Past   Smokeless tobacco: Never   Tobacco comments:    june 2021  Vaping Use   Vaping status: Every Day   Substances: Nicotine   Devices: occa  Substance and Sexual Activity   Alcohol use: Yes    Alcohol/week: 0.0 standard drinks of alcohol    Comment: social   Drug use: No   Sexual activity: Not on file  Other Topics Concern   Not on file  Social History Narrative   Single   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      Social Drivers of Health   Tobacco Use: Medium Risk (11/10/2024)   Patient History    Smoking Tobacco Use: Former    Smokeless Tobacco Use: Never    Passive Exposure: Past  Physicist, Medical Strain: Low Risk (05/26/2024)   Received from [facility]   Overall Financial Resource Strain (CARDIA)    How hard is it for you to pay for the very basics like  food, housing, medical care, and heating?: Not hard at all  Food Insecurity: No Food Insecurity (05/26/2024)   Received from [facility]   Epic    Within the past 12 months, you worried that your food would run out before you got the money to buy more.: Never true    Within the past 12 months, the food you bought just didn't last and you didn't have money to get more.: Never true  Transportation Needs: No Transportation Needs (05/26/2024)   Received from [facility]    In the past 12 months, has lack of transportation kept you from medical appointments or from getting medications?: No    In the past 12 months, has lack of transportation kept you from meetings, work, or from getting things needed for daily living?: No  Physical Activity: Inactive (05/26/2024)   Received from [facility]   Exercise Vital Sign    On average, how many days per  week do you engage in moderate to strenuous exercise (like a brisk walk)?: 0 days    Minutes of Exercise per Session: Not on file  Stress: Stress Concern Present (05/26/2024)   Received from [facility] - Occupational Stress Questionnaire    Do you feel stress - tense, restless, nervous, or anxious, or unable to sleep at night because your mind is troubled all the time - these days?: To some extent  Social Connections: Socially Integrated (05/26/2024)   [facility] from [facility]   Social Network    How would you rate your social network (family, work, friends)?: Good participation with social networks  Intimate Partner Violence: Not At Risk (05/26/2024)   Received from [facility]   HITS    Over the last 12 months how often did your partner physically hurt you?: Never    Over the last 12 months how often did your partner insult you or talk down to you?: Never    Over the last 12 months how often did your partner threaten you with physical harm?: Never    Over the last 12 months how often did your partner scream or curse at you?: Never  Depression (PHQ2-9): Low Risk (11/10/2024)   Depression (PHQ2-9)    PHQ-2 Score: 0  Alcohol Screen: Low Risk (12/05/2023)   Alcohol Screen    Last Alcohol Screening Score (AUDIT): 1  Housing: Low Risk (05/26/2024)   Received from [facility]    In the last 12 months, was there a time when you were not able to pay the mortgage or rent on time?: No    In the past 12 months, how many times have you moved where you were living?: 0    At any time in the past 12 months, were you homeless or living in a shelter (including now)?: No  Utilities: Not At Risk (05/26/2024)   Received from [facility]    In the past 12 months has the electric, gas, oil, or water company threatened to shut off services in your home?: No  Health Literacy: Not on file    Past Surgical History:  Procedure Laterality Date    CYSTOSCOPY W/ RETROGRADES Right 02/01/2022   Procedure: CYSTOSCOPY WITH RETROGRADE PYELOGRAM;  Surgeon: Francisca Redell BROCKS, MD;  Location: [facility];  Service: Urology;  Laterality: Right;   CYSTOSCOPY W/ URETERAL STENT PLACEMENT Right 02/14/2016   Procedure: CYSTOSCOPY WITH STENT EXCHANGE;  Surgeon: Redell Lynwood Napoleon,  MD;  Location: [facility];  Service: Urology;  Laterality: Right;   CYSTOSCOPY WITH STENT PLACEMENT Right 01/14/2016   Procedure: RIGHT CYSTOSCOPY, RIGHT RETROGRADE, RIGHT URETERAL STENT PLACEMENT;  Surgeon: Morene LELON Salines, MD;  Location: [facility];  Service: Urology;  Laterality: Right;   CYSTOSCOPY/URETEROSCOPY/HOLMIUM LASER/STENT PLACEMENT Right 01/16/2022   Procedure: CYSTOSCOPY/URETEROSCOPY/ RIGHT URETERAL STENT PLACEMENT;  Surgeon: Francisca Redell BROCKS, MD;  Location: [facility];  Service: Urology;  Laterality: Right;   CYSTOSCOPY/URETEROSCOPY/HOLMIUM LASER/STENT PLACEMENT Right 02/01/2022   Procedure: CYSTOSCOPY/URETEROSCOPY/HOLMIUM LASER/STENT PLACEMENT;  Surgeon: Francisca Redell BROCKS, MD;  Location: [facility];  Service: Urology;  Laterality: Right;   HOLMIUM LASER APPLICATION Right 02/14/2016   Procedure: HOLMIUM LASER APPLICATION;  Surgeon: Redell Lynwood Napoleon, MD;  Location: [facility];  Service: Urology;  Laterality: Right;   URETEROSCOPY WITH HOLMIUM LASER LITHOTRIPSY Right 02/14/2016   Procedure: URETEROSCOPY WITH HOLMIUM LASER LITHOTRIPSY;  Surgeon: Redell Lynwood Napoleon, MD;  Location: [facility];  Service: Urology;  Laterality: Right;    Family History  Problem Relation Age of Onset   Stroke Mother    Seizures Mother    Dementia Mother    Cancer Mother        Cervical?   Nephrolithiasis Mother    Hematuria Mother    Depression Mother    Hypertension Father    Arthritis Father    Heart disease Father    Depression Sister     Allergies[1]  Medications Ordered Prior to Encounter[2]  BP 116/78   Pulse (!) 57   Temp 98 F (36.7 C) (Oral)   Ht 5' 9 (1.753 m)   Wt 169 lb 2 oz  (76.7 kg)   LMP 10/31/2024   SpO2 100%   BMI 24.98 kg/m  Objective:   Physical Exam Cardiovascular:     Rate and Rhythm: Normal rate and regular rhythm.  Pulmonary:     Effort: Pulmonary effort is normal.     Breath sounds: Normal breath sounds.  Abdominal:     General: Bowel sounds are normal.     Palpations: Abdomen is soft.     Tenderness: There is no abdominal tenderness.  Musculoskeletal:     Cervical back: Neck supple.  Skin:    General: Skin is warm and dry.  Neurological:     Mental Status: She is alert and oriented to person, place, and time.  Psychiatric:        Mood and Affect: Mood normal.     Physical Exam        Assessment & Plan:  Other fatigue Assessment & Plan: Differentials include stress as she is the main caregiver of her partner, sleep apnea (less likely now with weight loss), metabolic cause. Commended her on weight loss and exercise.  Labs pending today pending TSH, CBC, iron studies, CMP, vitamin B12, vitamin D .  Continue duloxetine  per psychiatry.  Orders: -     TSH -     IBC + Ferritin -     CBC -     Comprehensive metabolic panel with GFR -     VITAMIN D  25 Hydroxy (Vit-D Deficiency, Fractures) -     Vitamin B12    Assessment and Plan Assessment & Plan         Molly MARLA Gaskins, NP       [1]  Allergies Allergen Reactions   Tramadol  Nausea Only  [2]  Current Outpatient Medications on File Prior to Visit  Medication Sig Dispense Refill   cetirizine (ZYRTEC) 10 MG tablet  Take 10 mg by mouth daily.     diclofenac  (VOLTAREN ) 75 MG EC tablet Take 1 tablet (75 mg total) by mouth 2 (two) times daily. 60 tablet 1   DULoxetine  (CYMBALTA ) 20 MG capsule TAKE 1 CAPSULE(20 MG) BY MOUTH TWICE DAILY 180 capsule 3   famotidine  (PEPCID ) 10 MG tablet Take 10 mg by mouth daily.     L-Methylfolate 7.5 MG TABS TAKE 1 TABLET BY MOUTH EVERY MORNING 30 tablet 11   lamoTRIgine  (LAMICTAL ) 25 MG tablet TAKE 1 TABLET(25 MG) BY MOUTH TWICE  DAILY 180 tablet 1   magnesium 30 MG tablet Take 30 mg by mouth at bedtime as needed.     Nutritional Supplements (LIPOTROPIC COMPLEX PO) Take by mouth. Takes as injectable     ondansetron  (ZOFRAN ) 4 MG tablet Take 1 tablet (4 mg total) by mouth every 6 (six) hours. 12 tablet 0   pantoprazole  (PROTONIX ) 20 MG tablet Take 1 tablet (20 mg total) by mouth daily. 30 tablet 0   sucralfate  (CARAFATE ) 1 g tablet Take 1 tablet (1 g total) by mouth 4 (four) times daily -  with meals and at bedtime. 120 tablet 0   tirzepatide (ZEPBOUND) 10 MG/0.5ML Pen Inject 10 mg into the skin once a week.     No current facility-administered medications on file prior to visit.   "

## 2024-11-11 ENCOUNTER — Ambulatory Visit: Admitting: Primary Care

## 2024-12-09 ENCOUNTER — Ambulatory Visit: Admitting: Psychiatry

## 2024-12-30 ENCOUNTER — Ambulatory Visit: Admitting: Psychiatry
